# Patient Record
Sex: Female | Born: 1995 | Race: Black or African American | Hispanic: No | Smoking: Former smoker
Health system: Southern US, Community
[De-identification: ages and names within clinical notes are randomized; demographics above are authoritative.]

## PROBLEM LIST (undated history)

## (undated) ENCOUNTER — Inpatient Hospital Stay (HOSPITAL_COMMUNITY): Payer: Self-pay

## (undated) ENCOUNTER — Emergency Department: Admission: EM | Payer: Medicaid Other

## (undated) DIAGNOSIS — R011 Cardiac murmur, unspecified: Secondary | ICD-10-CM

## (undated) DIAGNOSIS — T391X2A Poisoning by 4-Aminophenol derivatives, intentional self-harm, initial encounter: Secondary | ICD-10-CM

## (undated) DIAGNOSIS — F32A Depression, unspecified: Secondary | ICD-10-CM

## (undated) DIAGNOSIS — J40 Bronchitis, not specified as acute or chronic: Secondary | ICD-10-CM

## (undated) DIAGNOSIS — Z789 Other specified health status: Secondary | ICD-10-CM

## (undated) DIAGNOSIS — F329 Major depressive disorder, single episode, unspecified: Secondary | ICD-10-CM

## (undated) HISTORY — PX: NO PAST SURGERIES: SHX2092

---
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
## 2014-01-02 ENCOUNTER — Encounter (HOSPITAL_COMMUNITY): Payer: Self-pay | Admitting: *Deleted

## 2014-01-10 ENCOUNTER — Encounter: Payer: Medicaid Other | Admitting: Obstetrics and Gynecology

## 2014-01-10 ENCOUNTER — Encounter: Payer: Self-pay | Admitting: Obstetrics and Gynecology

## 2014-01-10 ENCOUNTER — Inpatient Hospital Stay (HOSPITAL_COMMUNITY)
Admission: AD | Admit: 2014-01-10 | Discharge: 2014-01-10 | Disposition: A | Discharge status: Home / Self Care | Payer: Medicaid Other | Source: Ambulatory Visit | Attending: Obstetrics & Gynecology | Admitting: Obstetrics & Gynecology

## 2014-01-10 ENCOUNTER — Encounter (HOSPITAL_COMMUNITY): Payer: Self-pay | Admitting: *Deleted

## 2014-01-10 DIAGNOSIS — Z3A29 29 weeks gestation of pregnancy: Secondary | ICD-10-CM | POA: Diagnosis not present

## 2014-01-10 DIAGNOSIS — N3000 Acute cystitis without hematuria: Secondary | ICD-10-CM

## 2014-01-10 HISTORY — DX: Bronchitis, not specified as acute or chronic: J40

## 2014-01-10 HISTORY — DX: Cardiac murmur, unspecified: R01.1

## 2014-01-10 LAB — DIFFERENTIAL
BASOS ABS: 0 10*3/uL (ref 0.0–0.1)
BASOS PCT: 0 % (ref 0–1)
Eosinophils Absolute: 0.2 10*3/uL (ref 0.0–0.7)
Eosinophils Relative: 2 % (ref 0–5)
LYMPHS PCT: 28 % (ref 12–46)
Lymphs Abs: 2.9 10*3/uL (ref 0.7–4.0)
Monocytes Absolute: 0.9 10*3/uL (ref 0.1–1.0)
Monocytes Relative: 9 % (ref 3–12)
NEUTROS ABS: 6.2 10*3/uL (ref 1.7–7.7)
NEUTROS PCT: 61 % (ref 43–77)

## 2014-01-10 LAB — URINALYSIS, ROUTINE W REFLEX MICROSCOPIC
Bilirubin Urine: NEGATIVE
Glucose, UA: NEGATIVE mg/dL
Hgb urine dipstick: NEGATIVE
KETONES UR: NEGATIVE mg/dL
LEUKOCYTES UA: NEGATIVE
Nitrite: POSITIVE — AB
PH: 6.5 (ref 5.0–8.0)
Protein, ur: NEGATIVE mg/dL
Specific Gravity, Urine: >1.03 — ABNORMAL HIGH (ref 1.005–1.030)
Urobilinogen, UA: 4 mg/dL — ABNORMAL HIGH (ref 0.0–1.0)

## 2014-01-10 LAB — HEPATITIS B SURFACE ANTIGEN: Hepatitis B Surface Ag: NEGATIVE

## 2014-01-10 LAB — CBC
HCT: 30.3 % — ABNORMAL LOW (ref 36.0–46.0)
HEMOGLOBIN: 10.2 g/dL — AB (ref 12.0–15.0)
MCH: 30.7 pg (ref 26.0–34.0)
MCHC: 33.7 g/dL (ref 30.0–36.0)
MCV: 91.3 fL (ref 78.0–100.0)
Platelets: 208 10*3/uL (ref 150–400)
RBC: 3.32 MIL/uL — ABNORMAL LOW (ref 3.87–5.11)
RDW: 12.4 % (ref 11.5–15.5)
WBC: 10.2 10*3/uL (ref 4.0–10.5)

## 2014-01-10 LAB — RUBELLA SCREEN: RUBELLA: 1.89 {index} — AB (ref <0.90)

## 2014-01-10 LAB — URINE MICROSCOPIC-ADD ON

## 2014-01-10 LAB — ABO/RH: ABO/RH(D): O POS

## 2014-01-10 LAB — HIV ANTIBODY (ROUTINE TESTING W REFLEX): HIV: NONREACTIVE

## 2014-01-10 LAB — TYPE AND SCREEN
ABO/RH(D): O POS
ANTIBODY SCREEN: NEGATIVE

## 2014-01-10 LAB — FETAL FIBRONECTIN: Fetal Fibronectin: NEGATIVE

## 2014-01-10 LAB — RPR

## 2014-01-10 MED ORDER — LACTATED RINGERS IV BOLUS (SEPSIS)
1000.0000 mL | Freq: Once | INTRAVENOUS | Status: AC
  Administered 2014-01-10: 1000 mL via INTRAVENOUS

## 2014-01-10 MED ORDER — NIFEDIPINE 10 MG PO CAPS
20.0000 mg | ORAL_CAPSULE | Freq: Once | ORAL | Status: AC
Start: 2014-01-10 — End: 2014-01-10
  Administered 2014-01-10: 20 mg via ORAL
  Filled 2014-01-10: qty 2

## 2014-01-10 MED ORDER — NITROFURANTOIN MONOHYD MACRO 100 MG PO CAPS: 100.0000 mg | ORAL_CAPSULE | Freq: Two times a day (BID) | ORAL | Status: DC

## 2014-01-10 NOTE — Discharge Instructions (Signed)
Pregnancy and Urinary Tract Infection °A urinary tract infection (UTI) is a bacterial infection of the urinary tract. Infection of the urinary tract can include the ureters, kidneys (pyelonephritis), bladder (cystitis), and urethra (urethritis). All pregnant women should be screened for bacteria in the urinary tract. Identifying and treating a UTI will decrease the risk of preterm labor and developing more serious infections in both the mother and baby. °CAUSES °Bacteria germs cause almost all UTIs.  °RISK FACTORS °Many factors can increase your chances of getting a UTI during pregnancy. These include: °· Having a short urethra. °· Poor toilet and hygiene habits. °· Sexual intercourse. °· Blockage of urine along the urinary tract. °· Problems with the pelvic muscles or nerves. °· Diabetes. °· Obesity. °· Bladder problems after having several children. °· Previous history of UTI. °SIGNS AND SYMPTOMS  °· Pain, burning, or a stinging feeling when urinating. °· Suddenly feeling the need to urinate right away (urgency). °· Loss of bladder control (urinary incontinence). °· Frequent urination, more than is common with pregnancy. °· Lower abdominal or back discomfort. °· Cloudy urine. °· Blood in the urine (hematuria). °· Fever.  °When the kidneys are infected, the symptoms may be: °· Back pain. °· Flank pain on the right side more so than the left. °· Fever. °· Chills. °· Nausea. °· Vomiting. °DIAGNOSIS  °A urinary tract infection is usually diagnosed through urine tests. Additional tests and procedures are sometimes done. These may include: °· Ultrasound exam of the kidneys, ureters, bladder, and urethra. °· Looking in the bladder with a lighted tube (cystoscopy). °TREATMENT °Typically, UTIs can be treated with antibiotic medicines.  °HOME CARE INSTRUCTIONS  °· Only take over-the-counter or prescription medicines as directed by your health care provider. If you were prescribed antibiotics, take them as directed. Finish  them even if you start to feel better. °· Drink enough fluids to keep your urine clear or pale yellow. °· Do not have sexual intercourse until the infection is gone and your health care provider says it is okay. °· Make sure you are tested for UTIs throughout your pregnancy. These infections often come back.  °Preventing a UTI in the Future °· Practice good toilet habits. Always wipe from front to back. Use the tissue only once. °· Do not hold your urine. Empty your bladder as soon as possible when the urge comes. °· Do not douche or use deodorant sprays. °· Wash with soap and warm water around the genital area and the anus. °· Empty your bladder before and after sexual intercourse. °· Wear underwear with a cotton crotch. °· Avoid caffeine and carbonated drinks. They can irritate the bladder. °· Drink cranberry juice or take cranberry pills. This may decrease the risk of getting a UTI. °· Do not drink alcohol. °· Keep all your appointments and tests as scheduled.  °SEEK MEDICAL CARE IF:  °· Your symptoms get worse. °· You are still having fevers 2 or more days after treatment begins. °· You have a rash. °· You feel that you are having problems with medicines prescribed. °· You have abnormal vaginal discharge. °SEEK IMMEDIATE MEDICAL CARE IF:  °· You have back or flank pain. °· You have chills. °· You have blood in your urine. °· You have nausea and vomiting. °· You have contractions of your uterus. °· You have a gush of fluid from the vagina. °MAKE SURE YOU: °· Understand these instructions.   °· Will watch your condition.   °· Will get help right away if you are not doing   well or get worse.   °Document Released: 06/13/2010 Document Revised: 12/07/2012 Document Reviewed: 09/15/2012 °ExitCare® Patient Information ©2015 [employer]. This information is not intended to replace advice given to you by your health care provider. Make sure you discuss any questions you have with your health care provider. ° °

## 2014-01-10 NOTE — MAU Note (Signed)
Pt arrives via EMS with complaint of lower abd pain x one hour. Has an appointment here on 11/18, but has not had any other care.

## 2014-01-10 NOTE — MAU Provider Note (Signed)
History     CSN: [id number]  Arrival date and time: 01/10/14 0030   None     Chief Complaint  Patient presents with  . Abdominal Pain   HPI  Veronica Brooks is a 18 y.o. G2P1001 at 3373w2d with no prenatal care who presents with sudden onset, lower abdominal pain for 1 hour prior to arrival in MAU. Pain described as cramp like. Patient has not noticed anything that makes the pain better or worse. Has not tried any medications. No dysuria. No vaginal discharge. Endorses some chills, no fevers.   Reports that her pain feels somewhat similar to contractions. She denies LOF, vaginal bleeding. Reports active fetal movement.   OB History    Gravida Para Term Preterm AB TAB SAB Ectopic Multiple Living   2 1 1       1       Past Medical History  Diagnosis Date  . Medical history non-contributory   . Heart murmur   . Bronchitis     Past Surgical History  Procedure Laterality Date  . No past surgeries      History reviewed. No pertinent family history.  History  Substance Use Topics  . Smoking status: Never Smoker   . Smokeless tobacco: Never Used  . Alcohol Use: No    Allergies:  Allergies  Allergen Reactions  . Latex Rash    No prescriptions prior to admission    Review of Systems  All other systems reviewed and are negative.  Physical Exam   Blood pressure 102/49, pulse 88, temperature 99 F (37.2 C), temperature source Oral, resp. rate 18, last menstrual period 06/19/2013, SpO2 100 %.  Physical Exam  Nursing note and vitals reviewed. Constitutional: She is oriented to person, place, and time. She appears well-developed and well-nourished. No distress.  HENT:  Head: Normocephalic and atraumatic.  Eyes: EOM are normal.  Neck: Normal range of motion.  Cardiovascular: Normal rate, regular rhythm and normal heart sounds.   No murmur heard. Respiratory: Effort normal and breath sounds normal. No respiratory distress. She has no wheezes.  GI: Soft. Bowel sounds  are normal. She exhibits no distension. There is no tenderness. There is no rebound and no guarding.  Musculoskeletal: Normal range of motion. She exhibits no edema.  Neurological: She is alert and oriented to person, place, and time. She has normal reflexes.  Skin: Skin is warm and dry.  FHT:  FHR: 140 bpm, variability: moderate,  accelerations:  present,  decelerations:  Absent UC: Irregular, every 1-6 minutes Dilation: 1 Effacement (%): Thick Cervical Position: Posterior Exam by:: Dr. Loreta AveAcosta   Procedures-None  Results for orders placed or performed during the hospital encounter of 01/10/14 (from the past 24 hour(s))  Urinalysis, Routine w reflex microscopic     Status: Abnormal   Collection Time: 01/10/14 12:50 AM  Result Value Ref Range   Color, Urine YELLOW YELLOW   APPearance HAZY (A) CLEAR   Specific Gravity, Urine >1.030 (H) 1.005 - 1.030   pH 6.5 5.0 - 8.0   Glucose, UA NEGATIVE NEGATIVE mg/dL   Hgb urine dipstick NEGATIVE NEGATIVE   Bilirubin Urine NEGATIVE NEGATIVE   Ketones, ur NEGATIVE NEGATIVE mg/dL   Protein, ur NEGATIVE NEGATIVE mg/dL   Urobilinogen, UA 4.0 (H) 0.0 - 1.0 mg/dL   Nitrite POSITIVE (A) NEGATIVE   Leukocytes, UA NEGATIVE NEGATIVE  Urine microscopic-add on     Status: Abnormal   Collection Time: 01/10/14 12:50 AM  Result Value Ref Range   Squamous  Epithelial / LPF FEW (A) RARE   WBC, UA 0-2 <3 WBC/hpf   RBC / HPF 0-2 <3 RBC/hpf   Bacteria, UA MANY (A) RARE  CBC     Status: Abnormal   Collection Time: 01/10/14  1:15 AM  Result Value Ref Range   WBC 10.2 4.0 - 10.5 K/uL   RBC 3.32 (L) 3.87 - 5.11 MIL/uL   Hemoglobin 10.2 (L) 12.0 - 15.0 g/dL   HCT 11.930.3 (L) 14.736.0 - 82.946.0 %   MCV 91.3 78.0 - 100.0 fL   MCH 30.7 26.0 - 34.0 pg   MCHC 33.7 30.0 - 36.0 g/dL   RDW 56.212.4 13.011.5 - 86.515.5 %   Platelets 208 150 - 400 K/uL  Differential     Status: None   Collection Time: 01/10/14  1:15 AM  Result Value Ref Range   Neutrophils Relative % 61 43 - 77 %    Neutro Abs 6.2 1.7 - 7.7 K/uL   Lymphocytes Relative 28 12 - 46 %   Lymphs Abs 2.9 0.7 - 4.0 K/uL   Monocytes Relative 9 3 - 12 %   Monocytes Absolute 0.9 0.1 - 1.0 K/uL   Eosinophils Relative 2 0 - 5 %   Eosinophils Absolute 0.2 0.0 - 0.7 K/uL   Basophils Relative 0 0 - 1 %   Basophils Absolute 0.0 0.0 - 0.1 K/uL  Type and screen     Status: None   Collection Time: 01/10/14  1:15 AM  Result Value Ref Range   ABO/RH(D) O POS    Antibody Screen NEG    Sample Expiration 01/13/2014   ABO/Rh     Status: None   Collection Time: 01/10/14  1:15 AM  Result Value Ref Range   ABO/RH(D) O POS   Fetal fibronectin     Status: None   Collection Time: 01/10/14  1:55 AM  Result Value Ref Range   Fetal Fibronectin NEGATIVE NEGATIVE     Assessment and Plan  A: 6160w2d IUP with preterm contractions, likely secondary to UTI/cystitis as demonstrated by UA results. Contractions ceased with IV LR bolus and procardia. FFN negative. FHT reassuring.   P: Discharge home in stable condition Prescription for macrobid given for UTI Prenatal labs drawn.  Preterm labor precautions reviewed.   Jacquiline Doearker, Caleb 01/10/2014, 4:48 AM    OB fellow attestation:  I have seen and examined this patient; I agree with above documentation in the resident's note.   Veronica Brooks CharsSiler is a 18 y.o. G2P1001 reporting abdominal pain +FM, denies LOF, VB, vaginal discharge.  PE: BP 102/49 mmHg  Pulse 88  Temp(Src) 99 F (37.2 C) (Oral)  Resp 18  SpO2 100%  LMP 06/19/2013 Gen: calm comfortable, NAD Resp: normal effort, no distress Abd: gravid  ROS, labs, PMH reviewed Dilation: 1 Effacement (%): Thick Cervical Position: Posterior Exam by:: Dr. Loreta AveAcosta NST: reactive, contracting regularly q962min  Plan: - preterm labor precautions - continue routine follow up in OB clinic - placed IV, given 1L LR bolus, procardia 20mg  x 1, and contractions resolved.  rx'd macrobid for UTI  Perry MountACOSTA,Andrews Tener ROCIO, MD 7:51 AM

## 2014-01-17 ENCOUNTER — Encounter: Payer: Self-pay | Admitting: General Practice

## 2014-01-17 ENCOUNTER — Encounter: Payer: Self-pay | Admitting: Family

## 2014-01-17 ENCOUNTER — Ambulatory Visit (INDEPENDENT_AMBULATORY_CARE_PROVIDER_SITE_OTHER): Payer: Medicaid Other | Admitting: Family

## 2014-01-17 VITALS — BP 117/53 | HR 92 | Temp 97.4°F | Wt 125.5 lb

## 2014-01-17 DIAGNOSIS — O0930 Supervision of pregnancy with insufficient antenatal care, unspecified trimester: Secondary | ICD-10-CM | POA: Insufficient documentation

## 2014-01-17 DIAGNOSIS — O0933 Supervision of pregnancy with insufficient antenatal care, third trimester: Secondary | ICD-10-CM

## 2014-01-17 DIAGNOSIS — Z23 Encounter for immunization: Secondary | ICD-10-CM

## 2014-01-17 LAB — POCT URINALYSIS DIP (DEVICE)
Bilirubin Urine: NEGATIVE
Glucose, UA: NEGATIVE mg/dL
HGB URINE DIPSTICK: NEGATIVE
Ketones, ur: NEGATIVE mg/dL
Nitrite: POSITIVE — AB
PH: 7 (ref 5.0–8.0)
PROTEIN: NEGATIVE mg/dL
SPECIFIC GRAVITY, URINE: 1.015 (ref 1.005–1.030)
Urobilinogen, UA: 0.2 mg/dL (ref 0.0–1.0)

## 2014-01-17 LAB — GLUCOSE TOLERANCE, 1 HOUR (50G) W/O FASTING: GLUCOSE 1 HOUR GTT: 139 mg/dL (ref 70–140)

## 2014-01-17 LAB — OB RESULTS CONSOLE GC/CHLAMYDIA
Chlamydia: NEGATIVE
GC PROBE AMP, GENITAL: NEGATIVE

## 2014-01-17 MED ORDER — CONCEPT DHA 53.5-38-1 MG PO CAPS: 1.0000 | ORAL_CAPSULE | Freq: Every day | ORAL | Status: DC

## 2014-01-17 MED ORDER — TETANUS-DIPHTH-ACELL PERTUSSIS 5-2.5-18.5 LF-MCG/0.5 IM SUSP
0.5000 mL | Freq: Once | INTRAMUSCULAR | Status: AC
  Administered 2014-01-17: 0.5 mL via INTRAMUSCULAR

## 2014-01-17 NOTE — Progress Notes (Signed)
   Subjective:    Veronica GoreDeja Brooks is a G2P1001 4663w0d being seen today for her first obstetrical visit.  Her obstetrical history is significant for late prenatal care.. Current UTI, has not picked up RX.  Patient does intend to breast feed. Pregnancy history fully reviewed.  Patient reports no complaints.  Filed Vitals:   01/17/14 0910  BP: 117/53  Pulse: 92  Temp: 97.4 F (36.3 C)  Weight: 125 lb 8 oz (56.926 kg)    HISTORY: OB History  Gravida Para Term Preterm AB SAB TAB Ectopic Multiple Living  2 1 1       1     # Outcome Date GA Lbr Len/2nd Weight Sex Delivery Anes PTL Lv  2 Current           1 Term 03/13/12 8672w0d  8 lb (3.629 kg) F Vag-Spont None N Y     Past Medical History  Diagnosis Date  . Medical history non-contributory   . Bronchitis   . Heart murmur     Does not require cardiologist   Past Surgical History  Procedure Laterality Date  . No past surgeries     Family History  Problem Relation Age of Onset  . Diabetes Maternal Grandmother   . Cancer Maternal Grandfather     Exam   BP 117/53 mmHg  Pulse 92  Temp(Src) 97.4 F (36.3 C)  Wt 125 lb 8 oz (56.926 kg)  LMP 07/19/2013 (Exact Date) Uterine Size: size equals dates  Pelvic Exam:    Perineum: No Hemorrhoids, Normal Perineum   Vulva: normal   Vagina:  normal mucosa, normal discharge, no palpable nodules   pH: Not done   Cervix: no cervical motion tenderness and no lesions   Adnexa: normal adnexa and no mass, fullness, tenderness   Bony Pelvis: Adequate  System: Breast:  No nipple retraction or dimpling, No nipple discharge or bleeding, No axillary or supraclavicular adenopathy, Normal to palpation without dominant masses   Skin: normal coloration and turgor, no rashes    Neurologic: negative   Extremities: normal strength, tone, and muscle mass   HEENT neck supple with midline trachea and thyroid without masses   Mouth/Teeth mucous membranes moist, pharynx normal without lesions   Neck supple  and no masses   Cardiovascular: regular rate and rhythm, no murmurs or gallops   Respiratory:  appears well, vitals normal, no respiratory distress, acyanotic, normal RR, neck free of mass or lymphadenopathy, chest clear, no wheezing, crepitations, rhonchi, normal symmetric air entry   Abdomen: soft, non-tender; bowel sounds normal; no masses,  no organomegaly   Urinary: urethral meatus normal      Assessment:    Pregnancy:   18 yo G2P1001 at 2663w0d wks IUP by LMP  Patient Active Problem List   Diagnosis Date Noted  . Late prenatal care 01/17/2014  UTI in Pregnancy      Plan:     Reviewed labs results.   Explained importance of picking up RX for UTI.  Reviewed possible consequences including preterm labor, kidney infection.  Pt states will pick up today.   Third trimester labs today.   Prenatal vitamins. Problem list reviewed and updated. Genetic Screening:  Too Late  Ultrasound discussed; fetal survey: ordered.  Follow up in 2 weeks.   Marlis EdelsonKARIM, Tekla Malachowski N 01/17/2014

## 2014-01-17 NOTE — Progress Notes (Signed)
Initial prenatal visit 28 wk labs with TDap Flu vaccine Some labs done in MAU on 01/10/14

## 2014-01-18 LAB — GC/CHLAMYDIA PROBE AMP
CT PROBE, AMP APTIMA: NEGATIVE
GC Probe RNA: NEGATIVE

## 2014-01-19 ENCOUNTER — Ambulatory Visit (HOSPITAL_COMMUNITY)
Admission: RE | Admit: 2014-01-19 | Discharge: 2014-01-19 | Disposition: A | Discharge status: Home / Self Care | Payer: Medicaid Other | Source: Ambulatory Visit | Attending: Family | Admitting: Family

## 2014-01-19 ENCOUNTER — Encounter: Payer: Self-pay | Admitting: Family

## 2014-01-19 DIAGNOSIS — Z3A31 31 weeks gestation of pregnancy: Secondary | ICD-10-CM | POA: Insufficient documentation

## 2014-01-19 DIAGNOSIS — O0933 Supervision of pregnancy with insufficient antenatal care, third trimester: Secondary | ICD-10-CM | POA: Insufficient documentation

## 2014-01-19 DIAGNOSIS — IMO0001 Reserved for inherently not codable concepts without codable children: Secondary | ICD-10-CM | POA: Insufficient documentation

## 2014-01-19 DIAGNOSIS — Z36 Encounter for antenatal screening of mother: Secondary | ICD-10-CM | POA: Insufficient documentation

## 2014-01-20 LAB — CULTURE, OB URINE: Colony Count: >=100000

## 2014-01-22 ENCOUNTER — Telehealth: Payer: Self-pay | Admitting: *Deleted

## 2014-01-22 NOTE — Telephone Encounter (Signed)
-----   Message from Marlis EdelsonWalidah N Karim, [place] sent at 01/19/2014 10:48 PM EST ----- Regarding: Need 3 hr Please notify patient to come in for 3 hr GTT.

## 2014-01-22 NOTE — Telephone Encounter (Signed)
Called patient and left message for her to return our call.

## 2014-01-23 NOTE — Telephone Encounter (Signed)
Called patient and left message to please call us back.

## 2014-01-24 ENCOUNTER — Encounter: Payer: Self-pay | Admitting: *Deleted

## 2014-01-24 NOTE — Telephone Encounter (Signed)
Attempted to contact patient with both numbers listed, unable to leave a message.  Will send a certified letter.  Letter sent.

## 2014-01-31 ENCOUNTER — Encounter: Payer: Self-pay | Admitting: Obstetrics and Gynecology

## 2014-01-31 ENCOUNTER — Encounter: Payer: Medicaid Other | Admitting: Obstetrics and Gynecology

## 2014-02-13 ENCOUNTER — Telehealth: Payer: Self-pay | Admitting: *Deleted

## 2014-02-13 NOTE — Telephone Encounter (Signed)
Pt left message stating that she wants to know her due date from the [place] in [place].  I called pt and informed her that the due date is 03/23/14.  Because this is one month different from her due date by LMP, it is considered to be the most accurate.  Her due date will not be changed.  Pt voiced understanding.

## 2014-02-14 ENCOUNTER — Encounter: Payer: Self-pay | Admitting: Obstetrics and Gynecology

## 2014-02-14 ENCOUNTER — Ambulatory Visit (INDEPENDENT_AMBULATORY_CARE_PROVIDER_SITE_OTHER): Payer: Medicaid Other | Admitting: Obstetrics and Gynecology

## 2014-02-14 VITALS — BP 117/63 | HR 95 | Temp 98.4°F | Wt 128.6 lb

## 2014-02-14 DIAGNOSIS — R7302 Impaired glucose tolerance (oral): Secondary | ICD-10-CM

## 2014-02-14 DIAGNOSIS — R7309 Other abnormal glucose: Secondary | ICD-10-CM

## 2014-02-14 DIAGNOSIS — O0933 Supervision of pregnancy with insufficient antenatal care, third trimester: Secondary | ICD-10-CM

## 2014-02-14 DIAGNOSIS — O09893 Supervision of other high risk pregnancies, third trimester: Secondary | ICD-10-CM

## 2014-02-14 LAB — POCT URINALYSIS DIP (DEVICE)
Bilirubin Urine: NEGATIVE
Glucose, UA: NEGATIVE mg/dL
Hgb urine dipstick: NEGATIVE
Ketones, ur: NEGATIVE mg/dL
NITRITE: POSITIVE — AB
PH: 7 (ref 5.0–8.0)
PROTEIN: 30 mg/dL — AB
Specific Gravity, Urine: 1.02 (ref 1.005–1.030)

## 2014-02-14 NOTE — Patient Instructions (Signed)
Third Trimester of Pregnancy The third trimester is from week 29 through week 42, months 7 through 9. The third trimester is a time when the fetus is growing rapidly. At the end of the ninth month, the fetus is about 20 inches in length and weighs 6-10 pounds.  BODY CHANGES Your body goes through many changes during pregnancy. The changes vary from woman to woman.   Your weight will continue to increase. You can expect to gain 25-35 pounds (11-16 kg) by the end of the pregnancy.  You may begin to get stretch marks on your hips, abdomen, and breasts.  You may urinate more often because the fetus is moving lower into your pelvis and pressing on your bladder.  You may develop or continue to have heartburn as a result of your pregnancy.  You may develop constipation because certain hormones are causing the muscles that push waste through your intestines to slow down.  You may develop hemorrhoids or swollen, bulging veins (varicose veins).  You may have pelvic pain because of the weight gain and pregnancy hormones relaxing your joints between the bones in your pelvis. Backaches may result from overexertion of the muscles supporting your posture.  You may have changes in your hair. These can include thickening of your hair, rapid growth, and changes in texture. Some women also have hair loss during or after pregnancy, or hair that feels dry or thin. Your hair will most likely return to normal after your baby is born.  Your breasts will continue to grow and be tender. A yellow discharge may leak from your breasts called colostrum.  Your belly button may stick out.  You may feel short of breath because of your expanding uterus.  You may notice the fetus "dropping," or moving lower in your abdomen.  You may have a bloody mucus discharge. This usually occurs a few days to a week before labor begins.  Your cervix becomes thin and soft (effaced) near your due date. WHAT TO EXPECT AT YOUR PRENATAL  EXAMS  You will have prenatal exams every 2 weeks until week 36. Then, you will have weekly prenatal exams. During a routine prenatal visit:  You will be weighed to make sure you and the fetus are growing normally.  Your blood pressure is taken.  Your abdomen will be measured to track your baby's growth.  The fetal heartbeat will be listened to.  Any test results from the previous visit will be discussed.  You may have a cervical check near your due date to see if you have effaced. At around 36 weeks, your caregiver will check your cervix. At the same time, your caregiver will also perform a test on the secretions of the vaginal tissue. This test is to determine if a type of bacteria, Group B streptococcus, is present. Your caregiver will explain this further. Your caregiver may ask you:  What your birth plan is.  How you are feeling.  If you are feeling the baby move.  If you have had any abnormal symptoms, such as leaking fluid, bleeding, severe headaches, or abdominal cramping.  If you have any questions. Other tests or screenings that may be performed during your third trimester include:  Blood tests that check for low iron levels (anemia).  Fetal testing to check the health, activity level, and growth of the fetus. Testing is done if you have certain medical conditions or if there are problems during the pregnancy. FALSE LABOR You may feel small, irregular contractions that   eventually go away. These are called Braxton Hicks contractions, or false labor. Contractions may last for hours, days, or even weeks before true labor sets in. If contractions come at regular intervals, intensify, or become painful, it is best to be seen by your caregiver.  SIGNS OF LABOR   Menstrual-like cramps.  Contractions that are 5 minutes apart or less.  Contractions that start on the top of the uterus and spread down to the lower abdomen and back.  A sense of increased pelvic pressure or back  pain.  A watery or bloody mucus discharge that comes from the vagina. If you have any of these signs before the 37th week of pregnancy, call your caregiver right away. You need to go to the hospital to get checked immediately. HOME CARE INSTRUCTIONS   Avoid all smoking, herbs, alcohol, and unprescribed drugs. These chemicals affect the formation and growth of the baby.  Follow your caregiver's instructions regarding medicine use. There are medicines that are either safe or unsafe to take during pregnancy.  Exercise only as directed by your caregiver. Experiencing uterine cramps is a good sign to stop exercising.  Continue to eat regular, healthy meals.  Wear a good support bra for breast tenderness.  Do not use hot tubs, steam rooms, or saunas.  Wear your seat belt at all times when driving.  Avoid raw meat, uncooked cheese, cat litter boxes, and soil used by cats. These carry germs that can cause birth defects in the baby.  Take your prenatal vitamins.  Try taking a stool softener (if your caregiver approves) if you develop constipation. Eat more high-fiber foods, such as fresh vegetables or fruit and whole grains. Drink plenty of fluids to keep your urine clear or pale yellow.  Take warm sitz baths to soothe any pain or discomfort caused by hemorrhoids. Use hemorrhoid cream if your caregiver approves.  If you develop varicose veins, wear support hose. Elevate your feet for 15 minutes, 3-4 times a day. Limit salt in your diet.  Avoid heavy lifting, wear low heal shoes, and practice good posture.  Rest a lot with your legs elevated if you have leg cramps or low back pain.  Visit your dentist if you have not gone during your pregnancy. Use a soft toothbrush to brush your teeth and be gentle when you floss.  A sexual relationship may be continued unless your caregiver directs you otherwise.  Do not travel far distances unless it is absolutely necessary and only with the approval  of your caregiver.  Take prenatal classes to understand, practice, and ask questions about the labor and delivery.  Make a trial run to the hospital.  Pack your hospital bag.  Prepare the baby's nursery.  Continue to go to all your prenatal visits as directed by your caregiver. SEEK MEDICAL CARE IF:  You are unsure if you are in labor or if your water has broken.  You have dizziness.  You have mild pelvic cramps, pelvic pressure, or nagging pain in your abdominal area.  You have persistent nausea, vomiting, or diarrhea.  You have a bad smelling vaginal discharge.  You have pain with urination. SEEK IMMEDIATE MEDICAL CARE IF:   You have a fever.  You are leaking fluid from your vagina.  You have spotting or bleeding from your vagina.  You have severe abdominal cramping or pain.  You have rapid weight loss or gain.  You have shortness of breath with chest pain.  You notice sudden or extreme swelling   of your face, hands, ankles, feet, or legs.  You have not felt your baby move in over an hour.  You have severe headaches that do not go away with medicine.  You have vision changes. Document Released: 02/10/2001 Document Revised: 02/21/2013 Document Reviewed: 04/19/2012 [employer] Patient Information 2015 [employer]. This information is not intended to replace advice given to you by your health care provider. Make sure you discuss any questions you have with your health care provider.  

## 2014-02-14 NOTE — Progress Notes (Signed)
Reviewed US at 31 wks BEGA.  GCT 139> schedule 3hr OGTT.  UDS not found> sent.  E.Coli ASB was never treated. States she will get Rx filled asap.  Plans Mirena

## 2014-02-14 NOTE — Addendum Note (Signed)
Addended by: Candelaria StagersHAIZLIP, Araminta Zorn E on: 02/14/2014 10:35 AM   Modules accepted: Orders

## 2014-02-15 LAB — PRESCRIPTION MONITORING PROFILE (19 PANEL)
Amphetamine/Meth: NEGATIVE ng/mL
BARBITURATE SCREEN, URINE: NEGATIVE ng/mL
Benzodiazepine Screen, Urine: NEGATIVE ng/mL
Buprenorphine, Urine: NEGATIVE ng/mL
CANNABINOID SCRN UR: NEGATIVE ng/mL
CARISOPRODOL, URINE: NEGATIVE ng/mL
Cocaine Metabolites: NEGATIVE ng/mL
Creatinine, Urine: 208.93 mg/dL (ref >20.0)
Fentanyl, Ur: NEGATIVE ng/mL
MDMA URINE: NEGATIVE ng/mL
METHADONE SCREEN, URINE: NEGATIVE ng/mL
Meperidine, Ur: NEGATIVE ng/mL
Methaqualone: NEGATIVE ng/mL
Nitrites, Initial: NEGATIVE ug/mL
OPIATE SCREEN, URINE: NEGATIVE ng/mL
OXYCODONE SCRN UR: NEGATIVE ng/mL
PHENCYCLIDINE, UR: NEGATIVE ng/mL
Propoxyphene: NEGATIVE ng/mL
TRAMADOL UR: NEGATIVE ng/mL
Tapentadol, urine: NEGATIVE ng/mL
Zolpidem, Urine: NEGATIVE ng/mL
pH, Initial: 7.6 pH (ref 4.5–8.9)

## 2014-02-15 LAB — ALCOHOL METABOLITE (ETG), URINE: Ethyl Glucuronide (EtG): NEGATIVE ng/mL

## 2014-02-16 ENCOUNTER — Other Ambulatory Visit: Payer: Medicaid Other

## 2014-02-28 ENCOUNTER — Encounter: Payer: Self-pay | Admitting: Obstetrics and Gynecology

## 2014-02-28 ENCOUNTER — Encounter: Payer: Medicaid Other | Admitting: Obstetrics and Gynecology

## 2014-03-02 NOTE — L&D Delivery Note (Signed)
Delivery Note At 10:59 PM a viable female was delivered via Vaginal, Spontaneous Delivery (Presentation: Left Occiput Anterior).  APGAR: 9, 9; weight pending .   Placenta status: Intact, Spontaneous.  Cord: 3 vessels with the following complications: marginal insertion.    Anesthesia: None  Episiotomy: None Lacerations: Labial Suture Repair: none Est. Blood Loss (mL): 150  Mom to postpartum.  Baby to Couplet care / Skin to Skin   Delivery supervised by Philipp DeputyKim Shaw, CNM  Veronica Brooks,Veronica Brooks Wynne 03/23/2014, 11:18 PM

## 2014-03-04 ENCOUNTER — Inpatient Hospital Stay (HOSPITAL_COMMUNITY)
Admission: AD | Admit: 2014-03-04 | Discharge: 2014-03-04 | Disposition: A | Discharge status: Home / Self Care | Payer: Medicaid Other | Source: Ambulatory Visit | Attending: Obstetrics & Gynecology | Admitting: Obstetrics & Gynecology

## 2014-03-04 ENCOUNTER — Encounter (HOSPITAL_COMMUNITY): Payer: Self-pay | Admitting: *Deleted

## 2014-03-04 DIAGNOSIS — Z3A37 37 weeks gestation of pregnancy: Secondary | ICD-10-CM | POA: Diagnosis not present

## 2014-03-04 DIAGNOSIS — O471 False labor at or after 37 completed weeks of gestation: Secondary | ICD-10-CM | POA: Insufficient documentation

## 2014-03-04 DIAGNOSIS — O0933 Supervision of pregnancy with insufficient antenatal care, third trimester: Secondary | ICD-10-CM

## 2014-03-04 NOTE — MAU Note (Signed)
Pt started contracting last night and she says they have not stopped but have gotten progressively worse.  Denies vag bleeding or leaking.  Reports good fetal movement.

## 2014-03-04 NOTE — Discharge Instructions (Signed)
Braxton Hicks Contractions °Contractions of the uterus can occur throughout pregnancy. Contractions are not always a sign that you are in labor.  °WHAT ARE BRAXTON HICKS CONTRACTIONS?  °Contractions that occur before labor are called Braxton Hicks contractions, or false labor. Toward the end of pregnancy (32-34 weeks), these contractions can develop more often and may become more forceful. This is not true labor because these contractions do not result in opening (dilatation) and thinning of the cervix. They are sometimes difficult to tell apart from true labor because these contractions can be forceful and people have different pain tolerances. You should not feel embarrassed if you go to the hospital with false labor. Sometimes, the only way to tell if you are in true labor is for your health care provider to look for changes in the cervix. °If there are no prenatal problems or other health problems associated with the pregnancy, it is completely safe to be sent home with false labor and await the onset of true labor. °HOW CAN YOU TELL THE DIFFERENCE BETWEEN TRUE AND FALSE LABOR? °False Labor °· The contractions of false labor are usually shorter and not as hard as those of true labor.   °· The contractions are usually irregular.   °· The contractions are often felt in the front of the lower abdomen and in the groin.   °· The contractions may go away when you walk around or change positions while lying down.   °· The contractions get weaker and are shorter lasting as time goes on.   °· The contractions do not usually become progressively stronger, regular, and closer together as with true labor.   °True Labor °· Contractions in true labor last 30-70 seconds, become very regular, usually become more intense, and increase in frequency.   °· The contractions do not go away with walking.   °· The discomfort is usually felt in the top of the uterus and spreads to the lower abdomen and low back.   °· True labor can be  determined by your health care provider with an exam. This will show that the cervix is dilating and getting thinner.   °WHAT TO REMEMBER °· Keep up with your usual exercises and follow other instructions given by your health care provider.   °· Take medicines as directed by your health care provider.   °· Keep your regular prenatal appointments.   °· Eat and drink lightly if you think you are going into labor.   °· If Braxton Hicks contractions are making you uncomfortable:   °¨ Change your position from lying down or resting to walking, or from walking to resting.   °¨ Sit and rest in a tub of warm water.   °¨ Drink 2-3 glasses of water. Dehydration may cause these contractions.   °¨ Do slow and deep breathing several times an hour.   °WHEN SHOULD I SEEK IMMEDIATE MEDICAL CARE? °Seek immediate medical care if: °· Your contractions become stronger, more regular, and closer together.   °· You have fluid leaking or gushing from your vagina.   °· You have a fever.   °· You pass blood-tinged mucus.   °· You have vaginal bleeding.   °· You have continuous abdominal pain.   °· You have low back pain that you never had before.   °· You feel your baby's head pushing down and causing pelvic pressure.   °· Your baby is not moving as much as it used to.   °Document Released: 02/16/2005 Document Revised: 02/21/2013 Document Reviewed: 11/28/2012 °[employer]® Patient Information ©2015 [employer]. This information is not intended to replace advice given to you by your health care   provider. Make sure you discuss any questions you have with your health care provider. ° °

## 2014-03-04 NOTE — MAU Note (Signed)
Pt reports that she had one  pepsi and ice all day.

## 2014-03-07 ENCOUNTER — Ambulatory Visit (INDEPENDENT_AMBULATORY_CARE_PROVIDER_SITE_OTHER): Payer: Medicaid Other | Admitting: Advanced Practice Midwife

## 2014-03-07 VITALS — BP 94/61 | HR 97 | Wt 131.1 lb

## 2014-03-07 DIAGNOSIS — O0933 Supervision of pregnancy with insufficient antenatal care, third trimester: Secondary | ICD-10-CM

## 2014-03-07 DIAGNOSIS — O358XX1 Maternal care for other (suspected) fetal abnormality and damage, fetus 1: Secondary | ICD-10-CM

## 2014-03-07 DIAGNOSIS — IMO0001 Reserved for inherently not codable concepts without codable children: Secondary | ICD-10-CM

## 2014-03-07 DIAGNOSIS — O2343 Unspecified infection of urinary tract in pregnancy, third trimester: Secondary | ICD-10-CM

## 2014-03-07 LAB — OB RESULTS CONSOLE GBS: GBS: NEGATIVE

## 2014-03-07 NOTE — Addendum Note (Signed)
Addended by: Kathee DeltonHILLMAN, Jabari Swoveland L on: 03/07/2014 11:25 AM   Modules accepted: Orders

## 2014-03-07 NOTE — Addendum Note (Signed)
Addended by: Raynald BlendZEYFANG, LINDA L on: 03/07/2014 10:30 AM   Modules accepted: Orders

## 2014-03-07 NOTE — Progress Notes (Signed)
Increased BH UC's. Needs F/U US w/ MFM for bilat renal enlargement-ordered.

## 2014-03-07 NOTE — Progress Notes (Signed)
Urine culture was requested on patient, but patient did not leave urine

## 2014-03-07 NOTE — Patient Instructions (Addendum)
Braxton Hicks Contractions Contractions of the uterus can occur throughout pregnancy. Contractions are not always a sign that you are in labor.  WHAT ARE BRAXTON HICKS CONTRACTIONS?  Contractions that occur before labor are called Braxton Hicks contractions, or false labor. Toward the end of pregnancy (32-34 weeks), these contractions can develop more often and may become more forceful. This is not true labor because these contractions do not result in opening (dilatation) and thinning of the cervix. They are sometimes difficult to tell apart from true labor because these contractions can be forceful and people have different pain tolerances. You should not feel embarrassed if you go to the hospital with false labor. Sometimes, the only way to tell if you are in true labor is for your health care provider to look for changes in the cervix. If there are no prenatal problems or other health problems associated with the pregnancy, it is completely safe to be sent home with false labor and await the onset of true labor. HOW CAN YOU TELL THE DIFFERENCE BETWEEN TRUE AND FALSE LABOR? False Labor  The contractions of false labor are usually shorter and not as hard as those of true labor.   The contractions are usually irregular.   The contractions are often felt in the front of the lower abdomen and in the groin.   The contractions may go away when you walk around or change positions while lying down.   The contractions get weaker and are shorter lasting as time goes on.   The contractions do not usually become progressively stronger, regular, and closer together as with true labor.  True Labor 1. Contractions in true labor last 30-70 seconds, become very regular, usually become more intense, and increase in frequency.  2. The contractions do not go away with walking.  3. The discomfort is usually felt in the top of the uterus and spreads to the lower abdomen and low back.  4. True labor can  be determined by your health care provider with an exam. This will show that the cervix is dilating and getting thinner.  WHAT TO REMEMBER  Keep up with your usual exercises and follow other instructions given by your health care provider.   Take medicines as directed by your health care provider.   Keep your regular prenatal appointments.   Eat and drink lightly if you think you are going into labor.   If Braxton Hicks contractions are making you uncomfortable:   Change your position from lying down or resting to walking, or from walking to resting.   Sit and rest in a tub of warm water.   Drink 2-3 glasses of water. Dehydration may cause these contractions.   Do slow and deep breathing several times an hour.  WHEN SHOULD I SEEK IMMEDIATE MEDICAL CARE? Seek immediate medical care if:  Your contractions become stronger, more regular, and closer together.   You have fluid leaking or gushing from your vagina.   You have a fever.   You pass blood-tinged mucus.   You have vaginal bleeding.   You have continuous abdominal pain.   You have low back pain that you never had before.   You feel your baby's head pushing down and causing pelvic pressure.   Your baby is not moving as much as it used to.  Document Released: 02/16/2005 Document Revised: 02/21/2013 Document Reviewed: 11/28/2012 [facility] Patient Information 2015 [place], [place]. This information is not intended to replace advice given to you by your health care  provider. Make sure you discuss any questions you have with your health care provider.  Fetal Movement Counts Patient Name: __________________________________________________ Patient Due Date: ____________________ Performing a fetal movement count is highly recommended in high-risk pregnancies, but it is good for every pregnant woman to do. Your health care provider may ask you to start counting fetal movements at 28 weeks of the pregnancy. Fetal  movements often increase:  After eating a full meal.  After physical activity.  After eating or drinking something sweet or cold.  At rest. Pay attention to when you feel the baby is most active. This will help you notice a pattern of your baby's sleep and wake cycles and what factors contribute to an increase in fetal movement. It is important to perform a fetal movement count at the same time each day when your baby is normally most active.  HOW TO COUNT FETAL MOVEMENTS 5. Find a quiet and comfortable area to sit or lie down on your left side. Lying on your left side provides the best blood and oxygen circulation to your baby. 6. Write down the day and time on a sheet of paper or in a journal. 7. Start counting kicks, flutters, swishes, rolls, or jabs in a 2-hour period. You should feel at least 10 movements within 2 hours. 8. If you do not feel 10 movements in 2 hours, wait 2-3 hours and count again. Look for a change in the pattern or not enough counts in 2 hours. SEEK MEDICAL CARE IF:  You feel less than 10 counts in 2 hours, tried twice.  There is no movement in over an hour.  The pattern is changing or taking longer each day to reach 10 counts in 2 hours.  You feel the baby is not moving as he or she usually does. Date: ____________ Movements: ____________ Start time: ____________ Doreatha MartinFinish time: ____________  Date: ____________ Movements: ____________ Start time: ____________ Doreatha MartinFinish time: ____________ Date: ____________ Movements: ____________ Start time: ____________ Doreatha MartinFinish time: ____________ Date: ____________ Movements: ____________ Start time: ____________ Doreatha MartinFinish time: ____________ Date: ____________ Movements: ____________ Start time: ____________ Doreatha MartinFinish time: ____________ Date: ____________ Movements: ____________ Start time: ____________ Doreatha MartinFinish time: ____________ Date: ____________ Movements: ____________ Start time: ____________ Doreatha MartinFinish time: ____________ Date: ____________  Movements: ____________ Start time: ____________ Doreatha MartinFinish time: ____________  Date: ____________ Movements: ____________ Start time: ____________ Doreatha MartinFinish time: ____________ Date: ____________ Movements: ____________ Start time: ____________ Doreatha MartinFinish time: ____________ Date: ____________ Movements: ____________ Start time: ____________ Doreatha MartinFinish time: ____________ Date: ____________ Movements: ____________ Start time: ____________ Doreatha MartinFinish time: ____________ Date: ____________ Movements: ____________ Start time: ____________ Doreatha MartinFinish time: ____________ Date: ____________ Movements: ____________ Start time: ____________ Doreatha MartinFinish time: ____________ Date: ____________ Movements: ____________ Start time: ____________ Doreatha MartinFinish time: ____________  Date: ____________ Movements: ____________ Start time: ____________ Doreatha MartinFinish time: ____________ Date: ____________ Movements: ____________ Start time: ____________ Doreatha MartinFinish time: ____________ Date: ____________ Movements: ____________ Start time: ____________ Doreatha MartinFinish time: ____________ Date: ____________ Movements: ____________ Start time: ____________ Doreatha MartinFinish time: ____________ Date: ____________ Movements: ____________ Start time: ____________ Doreatha MartinFinish time: ____________ Date: ____________ Movements: ____________ Start time: ____________ Doreatha MartinFinish time: ____________ Date: ____________ Movements: ____________ Start time: ____________ Doreatha MartinFinish time: ____________  Date: ____________ Movements: ____________ Start time: ____________ Doreatha MartinFinish time: ____________ Date: ____________ Movements: ____________ Start time: ____________ Doreatha MartinFinish time: ____________ Date: ____________ Movements: ____________ Start time: ____________ Doreatha MartinFinish time: ____________ Date: ____________ Movements: ____________ Start time: ____________ Doreatha MartinFinish time: ____________ Date: ____________ Movements: ____________ Start time: ____________ Doreatha MartinFinish time: ____________ Date: ____________ Movements: ____________ Start time:  ____________ Doreatha MartinFinish time: ____________ Date: ____________ Movements:  ____________ Start time: ____________ Doreatha Martin time: ____________  Date: ____________ Movements: ____________ Start time: ____________ Doreatha Martin time: ____________ Date: ____________ Movements: ____________ Start time: ____________ Doreatha Martin time: ____________ Date: ____________ Movements: ____________ Start time: ____________ Doreatha Martin time: ____________ Date: ____________ Movements: ____________ Start time: ____________ Doreatha Martin time: ____________ Date: ____________ Movements: ____________ Start time: ____________ Doreatha Martin time: ____________ Date: ____________ Movements: ____________ Start time: ____________ Doreatha Martin time: ____________ Date: ____________ Movements: ____________ Start time: ____________ Doreatha Martin time: ____________  Date: ____________ Movements: ____________ Start time: ____________ Doreatha Martin time: ____________ Date: ____________ Movements: ____________ Start time: ____________ Doreatha Martin time: ____________ Date: ____________ Movements: ____________ Start time: ____________ Doreatha Martin time: ____________ Date: ____________ Movements: ____________ Start time: ____________ Doreatha Martin time: ____________ Date: ____________ Movements: ____________ Start time: ____________ Doreatha Martin time: ____________ Date: ____________ Movements: ____________ Start time: ____________ Doreatha Martin time: ____________ Date: ____________ Movements: ____________ Start time: ____________ Doreatha Martin time: ____________  Date: ____________ Movements: ____________ Start time: ____________ Doreatha Martin time: ____________ Date: ____________ Movements: ____________ Start time: ____________ Doreatha Martin time: ____________ Date: ____________ Movements: ____________ Start time: ____________ Doreatha Martin time: ____________ Date: ____________ Movements: ____________ Start time: ____________ Doreatha Martin time: ____________ Date: ____________ Movements: ____________ Start time: ____________ Doreatha Martin time: ____________ Date:  ____________ Movements: ____________ Start time: ____________ Doreatha Martin time: ____________ Date: ____________ Movements: ____________ Start time: ____________ Doreatha Martin time: ____________  Date: ____________ Movements: ____________ Start time: ____________ Doreatha Martin time: ____________ Date: ____________ Movements: ____________ Start time: ____________ Doreatha Martin time: ____________ Date: ____________ Movements: ____________ Start time: ____________ Doreatha Martin time: ____________ Date: ____________ Movements: ____________ Start time: ____________ Doreatha Martin time: ____________ Date: ____________ Movements: ____________ Start time: ____________ Doreatha Martin time: ____________ Date: ____________ Movements: ____________ Start time: ____________ Doreatha Martin time: ____________ Document Released: 03/18/2006 Document Revised: 07/03/2013 Document Reviewed: 12/14/2011 [employer] Patient Information 2015 [employer]. This information is not intended to replace advice given to you by your health care provider. Make sure you discuss any questions you have with your health care provider.  Etonogestrel implant What is this medicine? ETONOGESTREL (et oh noe JES trel) is a contraceptive (birth control) device. It is used to prevent pregnancy. It can be used for up to 3 years. This medicine may be used for other purposes; ask your health care provider or pharmacist if you have questions. COMMON BRAND NAME(S): Implanon, Nexplanon What should I tell my health care provider before I take this medicine? They need to know if you have any of these conditions: -abnormal vaginal bleeding -blood vessel disease or blood clots -cancer of the breast, cervix, or liver -depression -diabetes -gallbladder disease -headaches -heart disease or recent heart attack -high blood pressure -high cholesterol -kidney disease -liver disease -renal disease -seizures -tobacco smoker -an unusual or allergic reaction to etonogestrel, other hormones, anesthetics or  antiseptics, medicines, foods, dyes, or preservatives -pregnant or trying to get pregnant -breast-feeding How should I use this medicine? This device is inserted just under the skin on the inner side of your upper arm by a health care professional. Talk to your pediatrician regarding the use of this medicine in children. Special care may be needed. Overdosage: If you think you've taken too much of this medicine contact a poison control center or emergency room at once. Overdosage: If you think you have taken too much of this medicine contact a poison control center or emergency room at once. NOTE: This medicine is only for you. Do not share this medicine with others. What if I miss a dose? This does not apply. What may interact with this medicine? Do not take this medicine with any of the  following medications: -amprenavir -bosentan -fosamprenavir This medicine may also interact with the following medications: -barbiturate medicines for inducing sleep or treating seizures -certain medicines for fungal infections like ketoconazole and itraconazole -griseofulvin -medicines to treat seizures like carbamazepine, felbamate, oxcarbazepine, phenytoin, topiramate -modafinil -phenylbutazone -rifampin -some medicines to treat HIV infection like atazanavir, indinavir, lopinavir, nelfinavir, tipranavir, ritonavir -St. John's wort This list may not describe all possible interactions. Give your health care provider a list of all the medicines, herbs, non-prescription drugs, or dietary supplements you use. Also tell them if you smoke, drink alcohol, or use illegal drugs. Some items may interact with your medicine. What should I watch for while using this medicine? This product does not protect you against HIV infection (AIDS) or other sexually transmitted diseases. You should be able to feel the implant by pressing your fingertips over the skin where it was inserted. Tell your doctor if you cannot feel  the implant. What side effects may I notice from receiving this medicine? Side effects that you should report to your doctor or health care professional as soon as possible: -allergic reactions like skin rash, itching or hives, swelling of the face, lips, or tongue -breast lumps -changes in vision -confusion, trouble speaking or understanding -dark urine -depressed mood -general ill feeling or flu-like symptoms -light-colored stools -loss of appetite, nausea -right upper belly pain -severe headaches -severe pain, swelling, or tenderness in the abdomen -shortness of breath, chest pain, swelling in a leg -signs of pregnancy -sudden numbness or weakness of the face, arm or leg -trouble walking, dizziness, loss of balance or coordination -unusual vaginal bleeding, discharge -unusually weak or tired -yellowing of the eyes or skin Side effects that usually do not require medical attention (Report these to your doctor or health care professional if they continue or are bothersome.): -acne -breast pain -changes in weight -cough -fever or chills -headache -irregular menstrual bleeding -itching, burning, and vaginal discharge -pain or difficulty passing urine -sore throat This list may not describe all possible side effects. Call your doctor for medical advice about side effects. You may report side effects to FDA at 1-800-FDA-1088. Where should I keep my medicine? This drug is given in a hospital or clinic and will not be stored at home. NOTE: This sheet is a summary. It may not cover all possible information. If you have questions about this medicine, talk to your doctor, pharmacist, or health care provider.  2015, [employer]. (2011-08-24 15:37:45)

## 2014-03-07 NOTE — Progress Notes (Signed)
Thinks she lost her mucus plug yesterday.

## 2014-03-08 ENCOUNTER — Encounter (HOSPITAL_COMMUNITY): Payer: Self-pay | Admitting: *Deleted

## 2014-03-08 ENCOUNTER — Inpatient Hospital Stay (HOSPITAL_COMMUNITY)
Admission: AD | Admit: 2014-03-08 | Discharge: 2014-03-08 | Disposition: A | Discharge status: Home / Self Care | Payer: Medicaid Other | Source: Ambulatory Visit | Attending: Obstetrics and Gynecology | Admitting: Obstetrics and Gynecology

## 2014-03-08 DIAGNOSIS — O471 False labor at or after 37 completed weeks of gestation: Secondary | ICD-10-CM | POA: Diagnosis not present

## 2014-03-08 DIAGNOSIS — Z3A37 37 weeks gestation of pregnancy: Secondary | ICD-10-CM | POA: Insufficient documentation

## 2014-03-08 DIAGNOSIS — O0933 Supervision of pregnancy with insufficient antenatal care, third trimester: Secondary | ICD-10-CM

## 2014-03-08 LAB — POCT FERN TEST: POCT Fern Test: NEGATIVE

## 2014-03-08 NOTE — MAU Note (Signed)
Pt reports increased pelvic pressure and cts q3-4 min. Pt denies vaginal bleeding or SROM and good fetal movement reported.

## 2014-03-08 NOTE — MAU Provider Note (Signed)
Ms. Veronica Brooks is a 19 y.o. G2P1001 at [id number]  who presents to MAU today complaining contractions and LOF. The patient states a small amount of clear white discharge. She denies vaginal bleeding. She states frequent contractions, but unsure of frequency. She denies complications with this pregnancy.   BP 113/62 mmHg  Pulse 94  Temp(Src) 98.4 F (36.9 C) (Oral)  Resp 18  LMP 07/19/2013 (Exact Date) GENERAL: Well-developed, well-nourished female in no acute distress.  HEENT: Normocephalic, atraumatic.  LUNGS: Normal effort HEART: Regular rate  ABDOMEN: Soft, nontender, nondistended.  PELVIC: Normal external female genitalia. Vagina is pink and rugated.  Normal discharge.  Negative pooling. Gravid uterus.   EXTREMITIES: No cyanosis, clubbing, or edema Dilation: 2 Effacement (%): 50 Cervical Position: Posterior Station: -2 Presentation: Vertex Exam by:: K.Wilson,RN  Fern - negative  Fetal Monitoring:  Baseline: 125 bpm, moderate variability, + accelerations Contractions: occasional with moderate UI  A: Membrane intact  P: Report given to RN to contact CNM on call for further instructions  Marny LowensteinJulie N Wenzel, PA-C 03/08/2014 12:25 PM

## 2014-03-09 LAB — CULTURE, BETA STREP (GROUP B ONLY)

## 2014-03-13 ENCOUNTER — Encounter: Payer: Self-pay | Admitting: General Practice

## 2014-03-14 ENCOUNTER — Ambulatory Visit (INDEPENDENT_AMBULATORY_CARE_PROVIDER_SITE_OTHER): Payer: Medicaid Other | Admitting: Advanced Practice Midwife

## 2014-03-14 VITALS — BP 116/64 | HR 85 | Wt 132.8 lb

## 2014-03-14 DIAGNOSIS — R319 Hematuria, unspecified: Secondary | ICD-10-CM

## 2014-03-14 DIAGNOSIS — O0933 Supervision of pregnancy with insufficient antenatal care, third trimester: Secondary | ICD-10-CM

## 2014-03-14 LAB — POCT URINALYSIS DIP (DEVICE)
GLUCOSE, UA: NEGATIVE mg/dL
Ketones, ur: NEGATIVE mg/dL
NITRITE: NEGATIVE
PROTEIN: 30 mg/dL — AB
UROBILINOGEN UA: 4 mg/dL — AB (ref 0.0–1.0)
pH: 6.5 (ref 5.0–8.0)

## 2014-03-14 NOTE — Progress Notes (Signed)
Has US scheduled tomorrow for bilat renal enlargement. Neg GBS. Hgb, leuks in urine. Asymptomatic. Culture sent.

## 2014-03-14 NOTE — Patient Instructions (Signed)
Braxton Hicks Contractions °Contractions of the uterus can occur throughout pregnancy. Contractions are not always a sign that you are in labor.  °WHAT ARE BRAXTON HICKS CONTRACTIONS?  °Contractions that occur before labor are called Braxton Hicks contractions, or false labor. Toward the end of pregnancy (32-34 weeks), these contractions can develop more often and may become more forceful. This is not true labor because these contractions do not result in opening (dilatation) and thinning of the cervix. They are sometimes difficult to tell apart from true labor because these contractions can be forceful and people have different pain tolerances. You should not feel embarrassed if you go to the hospital with false labor. Sometimes, the only way to tell if you are in true labor is for your health care provider to look for changes in the cervix. °If there are no prenatal problems or other health problems associated with the pregnancy, it is completely safe to be sent home with false labor and await the onset of true labor. °HOW CAN YOU TELL THE DIFFERENCE BETWEEN TRUE AND FALSE LABOR? °False Labor °· The contractions of false labor are usually shorter and not as hard as those of true labor.   °· The contractions are usually irregular.   °· The contractions are often felt in the front of the lower abdomen and in the groin.   °· The contractions may go away when you walk around or change positions while lying down.   °· The contractions get weaker and are shorter lasting as time goes on.   °· The contractions do not usually become progressively stronger, regular, and closer together as with true labor.   °True Labor °1. Contractions in true labor last 30-70 seconds, become very regular, usually become more intense, and increase in frequency.   °2. The contractions do not go away with walking.   °3. The discomfort is usually felt in the top of the uterus and spreads to the lower abdomen and low back.   °4. True labor can  be determined by your health care provider with an exam. This will show that the cervix is dilating and getting thinner.   °WHAT TO REMEMBER °· Keep up with your usual exercises and follow other instructions given by your health care provider.   °· Take medicines as directed by your health care provider.   °· Keep your regular prenatal appointments.   °· Eat and drink lightly if you think you are going into labor.   °· If Braxton Hicks contractions are making you uncomfortable:   °· Change your position from lying down or resting to walking, or from walking to resting.   °· Sit and rest in a tub of warm water.   °· Drink 2-3 glasses of water. Dehydration may cause these contractions.   °· Do slow and deep breathing several times an hour.   °WHEN SHOULD I SEEK IMMEDIATE MEDICAL CARE? °Seek immediate medical care if: °· Your contractions become stronger, more regular, and closer together.   °· You have fluid leaking or gushing from your vagina.   °· You have a fever.   °· You pass blood-tinged mucus.   °· You have vaginal bleeding.   °· You have continuous abdominal pain.   °· You have low back pain that you never had before.   °· You feel your baby's head pushing down and causing pelvic pressure.   °· Your baby is not moving as much as it used to.   °Document Released: 02/16/2005 Document Revised: 02/21/2013 Document Reviewed: 11/28/2012 °Exit[employer]® Patient Information ©2015 [employer]. This information is not intended to replace advice given to you by your health care   provider. Make sure you discuss any questions you have with your health care provider. ° °Fetal Movement Counts °Patient Name: __________________________________________________ Patient Due Date: ____________________ °Performing a fetal movement count is highly recommended in high-risk pregnancies, but it is good for every pregnant woman to do. Your health care provider may ask you to start counting fetal movements at 28 weeks of the pregnancy. Fetal  movements often increase: °· After eating a full meal. °· After physical activity. °· After eating or drinking something sweet or cold. °· At rest. °Pay attention to when you feel the baby is most active. This will help you notice a pattern of your baby's sleep and wake cycles and what factors contribute to an increase in fetal movement. It is important to perform a fetal movement count at the same time each day when your baby is normally most active.  °HOW TO COUNT FETAL MOVEMENTS °5. Find a quiet and comfortable area to sit or lie down on your left side. Lying on your left side provides the best blood and oxygen circulation to your baby. °6. Write down the day and time on a sheet of paper or in a journal. °7. Start counting kicks, flutters, swishes, rolls, or jabs in a 2-hour period. You should feel at least 10 movements within 2 hours. °8. If you do not feel 10 movements in 2 hours, wait 2-3 hours and count again. Look for a change in the pattern or not enough counts in 2 hours. °SEEK MEDICAL CARE IF: °· You feel less than 10 counts in 2 hours, tried twice. °· There is no movement in over an hour. °· The pattern is changing or taking longer each day to reach 10 counts in 2 hours. °· You feel the baby is not moving as he or she usually does. °Date: ____________ Movements: ____________ Start time: ____________ Finish time: ____________  °Date: ____________ Movements: ____________ Start time: ____________ Finish time: ____________ °Date: ____________ Movements: ____________ Start time: ____________ Finish time: ____________ °Date: ____________ Movements: ____________ Start time: ____________ Finish time: ____________ °Date: ____________ Movements: ____________ Start time: ____________ Finish time: ____________ °Date: ____________ Movements: ____________ Start time: ____________ Finish time: ____________ °Date: ____________ Movements: ____________ Start time: ____________ Finish time: ____________ °Date: ____________  Movements: ____________ Start time: ____________ Finish time: ____________  °Date: ____________ Movements: ____________ Start time: ____________ Finish time: ____________ °Date: ____________ Movements: ____________ Start time: ____________ Finish time: ____________ °Date: ____________ Movements: ____________ Start time: ____________ Finish time: ____________ °Date: ____________ Movements: ____________ Start time: ____________ Finish time: ____________ °Date: ____________ Movements: ____________ Start time: ____________ Finish time: ____________ °Date: ____________ Movements: ____________ Start time: ____________ Finish time: ____________ °Date: ____________ Movements: ____________ Start time: ____________ Finish time: ____________  °Date: ____________ Movements: ____________ Start time: ____________ Finish time: ____________ °Date: ____________ Movements: ____________ Start time: ____________ Finish time: ____________ °Date: ____________ Movements: ____________ Start time: ____________ Finish time: ____________ °Date: ____________ Movements: ____________ Start time: ____________ Finish time: ____________ °Date: ____________ Movements: ____________ Start time: ____________ Finish time: ____________ °Date: ____________ Movements: ____________ Start time: ____________ Finish time: ____________ °Date: ____________ Movements: ____________ Start time: ____________ Finish time: ____________  °Date: ____________ Movements: ____________ Start time: ____________ Finish time: ____________ °Date: ____________ Movements: ____________ Start time: ____________ Finish time: ____________ °Date: ____________ Movements: ____________ Start time: ____________ Finish time: ____________ °Date: ____________ Movements: ____________ Start time: ____________ Finish time: ____________ °Date: ____________ Movements: ____________ Start time: ____________ Finish time: ____________ °Date: ____________ Movements: ____________ Start time:  ____________ Finish time: ____________ °Date: ____________ Movements:   ____________ Start time: ____________ Finish time: ____________  °Date: ____________ Movements: ____________ Start time: ____________ Finish time: ____________ °Date: ____________ Movements: ____________ Start time: ____________ Finish time: ____________ °Date: ____________ Movements: ____________ Start time: ____________ Finish time: ____________ °Date: ____________ Movements: ____________ Start time: ____________ Finish time: ____________ °Date: ____________ Movements: ____________ Start time: ____________ Finish time: ____________ °Date: ____________ Movements: ____________ Start time: ____________ Finish time: ____________ °Date: ____________ Movements: ____________ Start time: ____________ Finish time: ____________  °Date: ____________ Movements: ____________ Start time: ____________ Finish time: ____________ °Date: ____________ Movements: ____________ Start time: ____________ Finish time: ____________ °Date: ____________ Movements: ____________ Start time: ____________ Finish time: ____________ °Date: ____________ Movements: ____________ Start time: ____________ Finish time: ____________ °Date: ____________ Movements: ____________ Start time: ____________ Finish time: ____________ °Date: ____________ Movements: ____________ Start time: ____________ Finish time: ____________ °Date: ____________ Movements: ____________ Start time: ____________ Finish time: ____________  °Date: ____________ Movements: ____________ Start time: ____________ Finish time: ____________ °Date: ____________ Movements: ____________ Start time: ____________ Finish time: ____________ °Date: ____________ Movements: ____________ Start time: ____________ Finish time: ____________ °Date: ____________ Movements: ____________ Start time: ____________ Finish time: ____________ °Date: ____________ Movements: ____________ Start time: ____________ Finish time: ____________ °Date:  ____________ Movements: ____________ Start time: ____________ Finish time: ____________ °Date: ____________ Movements: ____________ Start time: ____________ Finish time: ____________  °Date: ____________ Movements: ____________ Start time: ____________ Finish time: ____________ °Date: ____________ Movements: ____________ Start time: ____________ Finish time: ____________ °Date: ____________ Movements: ____________ Start time: ____________ Finish time: ____________ °Date: ____________ Movements: ____________ Start time: ____________ Finish time: ____________ °Date: ____________ Movements: ____________ Start time: ____________ Finish time: ____________ °Date: ____________ Movements: ____________ Start time: ____________ Finish time: ____________ °Document Released: 03/18/2006 Document Revised: 07/03/2013 Document Reviewed: 12/14/2011 °ExitCare® Patient Information ©2015 [employer]. This information is not intended to replace advice given to you by your health care provider. Make sure you discuss any questions you have with your health care provider. ° °

## 2014-03-15 ENCOUNTER — Ambulatory Visit (HOSPITAL_COMMUNITY)
Admission: RE | Admit: 2014-03-15 | Discharge: 2014-03-15 | Disposition: A | Discharge status: Home / Self Care | Payer: Medicaid Other | Source: Ambulatory Visit | Attending: Advanced Practice Midwife | Admitting: Advanced Practice Midwife

## 2014-03-15 DIAGNOSIS — O358XX1 Maternal care for other (suspected) fetal abnormality and damage, fetus 1: Secondary | ICD-10-CM | POA: Insufficient documentation

## 2014-03-15 DIAGNOSIS — IMO0001 Reserved for inherently not codable concepts without codable children: Secondary | ICD-10-CM

## 2014-03-15 DIAGNOSIS — O3660X0 Maternal care for excessive fetal growth, unspecified trimester, not applicable or unspecified: Secondary | ICD-10-CM | POA: Insufficient documentation

## 2014-03-15 DIAGNOSIS — Z3A38 38 weeks gestation of pregnancy: Secondary | ICD-10-CM | POA: Insufficient documentation

## 2014-03-15 DIAGNOSIS — IMO0002 Reserved for concepts with insufficient information to code with codable children: Secondary | ICD-10-CM | POA: Insufficient documentation

## 2014-03-16 LAB — CULTURE, OB URINE: Colony Count: >=100000

## 2014-03-21 ENCOUNTER — Ambulatory Visit (INDEPENDENT_AMBULATORY_CARE_PROVIDER_SITE_OTHER): Payer: Medicaid Other | Admitting: Obstetrics and Gynecology

## 2014-03-21 ENCOUNTER — Other Ambulatory Visit: Payer: Self-pay | Admitting: Advanced Practice Midwife

## 2014-03-21 ENCOUNTER — Encounter: Payer: Self-pay | Admitting: Advanced Practice Midwife

## 2014-03-21 VITALS — BP 114/64 | HR 104 | Temp 97.6°F | Wt 133.1 lb

## 2014-03-21 DIAGNOSIS — O2343 Unspecified infection of urinary tract in pregnancy, third trimester: Secondary | ICD-10-CM

## 2014-03-21 DIAGNOSIS — IMO0002 Reserved for concepts with insufficient information to code with codable children: Secondary | ICD-10-CM | POA: Insufficient documentation

## 2014-03-21 DIAGNOSIS — O3660X0 Maternal care for excessive fetal growth, unspecified trimester, not applicable or unspecified: Secondary | ICD-10-CM | POA: Insufficient documentation

## 2014-03-21 DIAGNOSIS — O2340 Unspecified infection of urinary tract in pregnancy, unspecified trimester: Secondary | ICD-10-CM | POA: Insufficient documentation

## 2014-03-21 DIAGNOSIS — O3663X1 Maternal care for excessive fetal growth, third trimester, fetus 1: Secondary | ICD-10-CM

## 2014-03-21 LAB — GLUCOSE, CAPILLARY: Glucose-Capillary: 76 mg/dL (ref 70–99)

## 2014-03-21 MED ORDER — CEPHALEXIN 500 MG PO CAPS: 500.0000 mg | ORAL_CAPSULE | Freq: Four times a day (QID) | ORAL | Status: DC

## 2014-03-21 NOTE — Progress Notes (Signed)
LGA by US at 38.6: 8#5, >90th, AC>97th, AFI 19.4 (80th%ile); 1 hr GCT 139, no 3 hr (vomited drink)> RCBG: 76. (P1 8# at 41 wks, diff FOB.) D/W Dr. Macon LargeAnyanwu> IOL at 40wk for suspected undiagnosed GDM. Fetal kidneys measure normal> advised postnatal US. Did not fill Rx Keflex for E coli ASB> will get today

## 2014-03-21 NOTE — Patient Instructions (Signed)
Labor Induction  Labor induction is when steps are taken to cause a pregnant woman to begin the labor process. Most women go into labor on their own between 37 weeks and 42 weeks of the pregnancy. When this does not happen or when there is a medical need, methods may be used to induce labor. Labor induction causes a pregnant woman's uterus to contract. It also causes the cervix to soften (ripen), open (dilate), and thin out (efface). Usually, labor is not induced before 39 weeks of the pregnancy unless there is a problem with the baby or mother.  Before inducing labor, your health care provider will consider a number of factors, including the following:  The medical condition of you and the baby.   How many weeks along you are.   The status of the baby's lung maturity.   The condition of the cervix.   The position of the baby.  WHAT ARE THE REASONS FOR LABOR INDUCTION? Labor may be induced for the following reasons:  The health of the baby or mother is at risk.   The pregnancy is overdue by 1 week or more.   The water breaks but labor does not start on its own.   The mother has a health condition or serious illness, such as high blood pressure, infection, placental abruption, or diabetes.  The amniotic fluid amounts are low around the baby.   The baby is distressed.  Convenience or wanting the baby to be born on a certain date is not a reason for inducing labor. WHAT METHODS ARE USED FOR LABOR INDUCTION? Several methods of labor induction may be used, such as:   Prostaglandin medicine. This medicine causes the cervix to dilate and ripen. The medicine will also start contractions. It can be taken by mouth or by inserting a suppository into the vagina.   Inserting a thin tube (catheter) with a balloon on the end into the vagina to dilate the cervix. Once inserted, the balloon is expanded with water, which causes the cervix to open.   Stripping the membranes. Your health  care provider separates amniotic sac tissue from the cervix, causing the cervix to be stretched and causing the release of a hormone called progesterone. This may cause the uterus to contract. It is often done during an office visit. You will be sent home to wait for the contractions to begin. You will then come in for an induction.   Breaking the water. Your health care provider makes a hole in the amniotic sac using a small instrument. Once the amniotic sac breaks, contractions should begin. This may still take hours to see an effect.   Medicine to trigger or strengthen contractions. This medicine is given through an IV access tube inserted into a vein in your arm.  All of the methods of induction, besides stripping the membranes, will be done in the hospital. Induction is done in the hospital so that you and the baby can be carefully monitored.  HOW LONG DOES IT TAKE FOR LABOR TO BE INDUCED? Some inductions can take up to 2-3 days. Depending on the cervix, it usually takes less time. It takes longer when you are induced early in the pregnancy or if this is your first pregnancy. If a mother is still pregnant and the induction has been going on for 2-3 days, either the mother will be sent home or a cesarean delivery will be needed. WHAT ARE THE RISKS ASSOCIATED WITH LABOR INDUCTION? Some of the risks of induction   include:   Changes in fetal heart rate, such as too high, too low, or erratic.   Fetal distress.   Chance of infection for the mother and baby.   Increased chance of having a cesarean delivery.   Breaking off (abruption) of the placenta from the uterus (rare).   Uterine rupture (very rare).  When induction is needed for medical reasons, the benefits of induction may outweigh the risks. WHAT ARE SOME REASONS FOR NOT INDUCING LABOR? Labor induction should not be done if:   It is shown that your baby does not tolerate labor.   You have had previous surgeries on your  uterus, such as a myomectomy or the removal of fibroids.   Your placenta lies very low in the uterus and blocks the opening of the cervix (placenta previa).   Your baby is not in a head-down position.   The umbilical cord drops down into the birth canal in front of the baby. This could cut off the baby's blood and oxygen supply.   You have had a previous cesarean delivery.   There are unusual circumstances, such as the baby being extremely premature.  Document Released: 07/08/2006 Document Revised: 10/19/2012 Document Reviewed: 09/15/2012 Exit[employer] Patient Information 2015 [employer]. This information is not intended to replace advice given to you by your health care provider. Make sure you discuss any questions you have with your health care provider.  

## 2014-03-22 ENCOUNTER — Telehealth (HOSPITAL_COMMUNITY): Payer: Self-pay | Admitting: *Deleted

## 2014-03-22 NOTE — Telephone Encounter (Signed)
Preadmission screen  

## 2014-03-23 ENCOUNTER — Inpatient Hospital Stay (HOSPITAL_COMMUNITY)
Admission: RE | Admit: 2014-03-23 | Discharge: 2014-03-25 | DRG: 775 | Disposition: A | Discharge status: Home / Self Care | Payer: Medicaid Other | Source: Ambulatory Visit | Attending: Obstetrics & Gynecology | Admitting: Obstetrics & Gynecology

## 2014-03-23 VITALS — BP 114/67 | HR 72 | Temp 97.9°F | Resp 18 | Ht 61.0 in | Wt 133.0 lb

## 2014-03-23 DIAGNOSIS — Z91040 Latex allergy status: Secondary | ICD-10-CM | POA: Diagnosis not present

## 2014-03-23 DIAGNOSIS — Z87891 Personal history of nicotine dependence: Secondary | ICD-10-CM

## 2014-03-23 DIAGNOSIS — Z3A40 40 weeks gestation of pregnancy: Secondary | ICD-10-CM | POA: Diagnosis present

## 2014-03-23 DIAGNOSIS — O24429 Gestational diabetes mellitus in childbirth, unspecified control: Secondary | ICD-10-CM | POA: Diagnosis present

## 2014-03-23 DIAGNOSIS — O43123 Velamentous insertion of umbilical cord, third trimester: Secondary | ICD-10-CM | POA: Diagnosis present

## 2014-03-23 DIAGNOSIS — O24419 Gestational diabetes mellitus in pregnancy, unspecified control: Secondary | ICD-10-CM | POA: Diagnosis present

## 2014-03-23 DIAGNOSIS — Z3490 Encounter for supervision of normal pregnancy, unspecified, unspecified trimester: Secondary | ICD-10-CM

## 2014-03-23 DIAGNOSIS — O0933 Supervision of pregnancy with insufficient antenatal care, third trimester: Secondary | ICD-10-CM

## 2014-03-23 LAB — CBC
HEMATOCRIT: 31.6 % — AB (ref 36.0–46.0)
HEMOGLOBIN: 10.1 g/dL — AB (ref 12.0–15.0)
MCH: 28.1 pg (ref 26.0–34.0)
MCHC: 32 g/dL (ref 30.0–36.0)
MCV: 88 fL (ref 78.0–100.0)
PLATELETS: 332 10*3/uL (ref 150–400)
RBC: 3.59 MIL/uL — AB (ref 3.87–5.11)
RDW: 13.5 % (ref 11.5–15.5)
WBC: 10.9 10*3/uL — AB (ref 4.0–10.5)

## 2014-03-23 LAB — TYPE AND SCREEN
ABO/RH(D): O POS
ANTIBODY SCREEN: NEGATIVE

## 2014-03-23 MED ORDER — OXYTOCIN BOLUS FROM INFUSION
500.0000 mL | INTRAVENOUS | Status: DC
  Administered 2014-03-23: 500 mL via INTRAVENOUS

## 2014-03-23 MED ORDER — FLEET ENEMA 7-19 GM/118ML RE ENEM: 1.0000 | ENEMA | RECTAL | Status: DC | PRN

## 2014-03-23 MED ORDER — TERBUTALINE SULFATE 1 MG/ML IJ SOLN: 0.2500 mg | Freq: Once | INTRAMUSCULAR | Status: AC | PRN

## 2014-03-23 MED ORDER — CITRIC ACID-SODIUM CITRATE 334-500 MG/5ML PO SOLN: 30.0000 mL | ORAL | Status: DC | PRN

## 2014-03-23 MED ORDER — ZOLPIDEM TARTRATE 5 MG PO TABS: 5.0000 mg | ORAL_TABLET | Freq: Every evening | ORAL | Status: DC | PRN

## 2014-03-23 MED ORDER — OXYCODONE-ACETAMINOPHEN 5-325 MG PO TABS: 2.0000 | ORAL_TABLET | ORAL | Status: DC | PRN

## 2014-03-23 MED ORDER — OXYTOCIN 40 UNITS IN LACTATED RINGERS INFUSION - SIMPLE MED
1.0000 m[IU]/min | INTRAVENOUS | Status: DC
  Administered 2014-03-23: 18 m[IU]/min via INTRAVENOUS
  Administered 2014-03-23: 2 m[IU]/min via INTRAVENOUS
  Filled 2014-03-23: qty 1000

## 2014-03-23 MED ORDER — FENTANYL CITRATE 0.05 MG/ML IJ SOLN
100.0000 ug | INTRAMUSCULAR | Status: DC | PRN
  Administered 2014-03-23 (×6): 100 ug via INTRAVENOUS
  Filled 2014-03-23 (×6): qty 2

## 2014-03-23 MED ORDER — LIDOCAINE HCL (PF) 1 % IJ SOLN
30.0000 mL | INTRAMUSCULAR | Status: DC | PRN
  Filled 2014-03-23: qty 30

## 2014-03-23 MED ORDER — LACTATED RINGERS IV SOLN
INTRAVENOUS | Status: DC
  Administered 2014-03-23: 16:00:00 via INTRAVENOUS

## 2014-03-23 MED ORDER — IBUPROFEN 600 MG PO TABS
600.0000 mg | ORAL_TABLET | Freq: Four times a day (QID) | ORAL | Status: DC
  Administered 2014-03-23 – 2014-03-25 (×7): 600 mg via ORAL
  Filled 2014-03-23 (×7): qty 1

## 2014-03-23 MED ORDER — OXYCODONE-ACETAMINOPHEN 5-325 MG PO TABS: 1.0000 | ORAL_TABLET | ORAL | Status: DC | PRN

## 2014-03-23 MED ORDER — OXYTOCIN 40 UNITS IN LACTATED RINGERS INFUSION - SIMPLE MED: 62.5000 mL/h | INTRAVENOUS | Status: DC

## 2014-03-23 MED ORDER — ACETAMINOPHEN 325 MG PO TABS: 650.0000 mg | ORAL_TABLET | ORAL | Status: DC | PRN

## 2014-03-23 MED ORDER — ONDANSETRON HCL 4 MG/2ML IJ SOLN: 4.0000 mg | Freq: Four times a day (QID) | INTRAMUSCULAR | Status: DC | PRN

## 2014-03-23 MED ORDER — LACTATED RINGERS IV SOLN
500.0000 mL | INTRAVENOUS | Status: DC | PRN
  Administered 2014-03-23: 500 mL via INTRAVENOUS

## 2014-03-23 NOTE — H&P (Signed)
Veronica Brooks is a 19 y.o. female G2P1001 with IUP at 4333w0d presenting for IOL for presumed undiagnosed GDM d/t EFW>90% and failed 1 hour gtt. [facility]  Prenatal History/Complications:  Late care ? GDM  Past Medical History: Past Medical History  Diagnosis Date  . Medical history non-contributory   . Bronchitis   . Heart murmur     Does not require cardiologist    Past Surgical History: Past Surgical History  Procedure Laterality Date  . No past surgeries      Obstetrical History: OB History    Gravida Para Term Preterm AB TAB SAB Ectopic Multiple Living   2 1 1       1       Social History: History   Social History  . Marital Status: Single    Spouse Name: N/A    Number of Children: N/A  . Years of Education: N/A   Social History Main Topics  . Smoking status: Former Smoker    Types: Cigars  . Smokeless tobacco: Never Used  . Alcohol Use: No  . Drug Use: No  . Sexual Activity: Yes    Birth Control/ Protection: None   Other Topics Concern  . Not on file   Social History Narrative    Family History: Family History  Problem Relation Age of Onset  . Diabetes Maternal Grandmother   . Cancer Maternal Grandfather     Allergies: Allergies  Allergen Reactions  . Latex Rash and Other (See Comments)    Reaction:  Burning     Prescriptions prior to admission  Medication Sig Dispense Refill Last Dose  . cephALEXin (KEFLEX) 500 MG capsule Take 1 capsule (500 mg total) by mouth 4 (four) times daily. (Patient not taking: Reported on 03/21/2014) 28 capsule 0 Not Taking  . Prenat-FeFum-FePo-FA-Omega 3 (CONCEPT DHA) 53.5-38-1 MG CAPS Take 1 tablet by mouth daily. (Patient not taking: Reported on 03/21/2014) 30 capsule 2 Not Taking     Prenatal Transfer Tool  Maternal Diabetes: No Maybe?? Genetic Screening: Declined Maternal Ultrasounds/Referrals: Normal Fetal Ultrasounds or other Referrals:  None Maternal Substance Abuse:  No Significant  Maternal Medications:  None Significant Maternal Lab Results: None   Review of Systems   Constitutional: Negative for fever and chills Eyes: Negative for visual disturbances Respiratory: Negative for shortness of breath, dyspnea Cardiovascular: Negative for chest pain or palpitations  Gastrointestinal: Negative for vomiting, diarrhea and constipation Genitourinary: Negative for dysuria and urgency Musculoskeletal: Negative for back pain, joint pain, myalgias  Neurological: Negative for dizziness and headaches    General appearance: alert, cooperative and no distress Lungs: clear to auscultation bilaterally Heart: regular rate and rhythm Abdomen: soft, non-tender; bowel sounds normal Pelvic: /4/20/-3 Extremi//--es: Denna HaggardHomans sign is negative, no sign of DVT DTR's 2+ Presentation: cephalic Fetal monitoringBaseline: 140 bpm, Variability: Good {> 6 bpm), Accelerations: Reactive and Decelerations: Absent Uterine activityNone     Prenatal labs: ABO, Rh: --/--/O POS, O POS (11/11 0115) Antibody: NEG (11/11 0115) Rubella:   immunNON REAC (11/11 0115)  HBsAg: NEGATIVE (11/11 0115)  HIV: NONREACTIVE (11/11 0115)  GBS: Negative (01/06 0000)  1 hr Glucola 139 (didn't do 3 hour) Genetic screening  Too late Anatomy US normal. EFW > 90%   No results found for this or any previous visit (from the past 24 hour(s)).  Assessment: Veronica Brooks is a 19 y.o. G2P1001 with an IUP at 1833w0d presenting for IOL for presumed GDM d/t EFW >90%  Plan: #Labor:  pitocin #Pain:  Iv vs epidural #FWB cat 1 #ID: GBS: neg  #MOF:  breast #MOC: nexplanon #Circ: OP   CRESENZO-DISHMAN,Belina Mandile 03/23/2014, 7:55 AM

## 2014-03-23 NOTE — Progress Notes (Signed)
Veronica GoreDeja Cast is a 19 y.o. G2P1001 at 10364w0d by admitted for induction of labor due to possible Gestational diabetes.  Subjective: AROM @ approximately 1830. Contractions became stronger soon after AROM.  Objective: BP 102/69 mmHg  Pulse 100  Temp(Src) 98 F (36.7 C) (Oral)  Resp 18  Ht 5\' 1"  (1.549 m)  Wt 60.328 kg (133 lb)  BMI 25.14 kg/m2  LMP 07/19/2013 (Exact Date)      FHT:  FHR: 150 bpm, variability: moderate,  accelerations:  Present,  decelerations:  Absent SVE:   Dilation: 5 Effacement (%): 90 Station: -2 Exam by:: Pincus BadderK Shaw CNM  Labs: Lab Results  Component Value Date   WBC 10.9* 03/23/2014   HGB 10.1* 03/23/2014   HCT 31.6* 03/23/2014   MCV 88.0 03/23/2014   PLT 332 03/23/2014    Assessment / Plan: Induction of labor due to gestational diabetes,  progressing well on pitocin  Labor: on pitocin, s/p AROM  Fetal Wellbeing:  Category I Pain Control:  Fentanyl Anticipated MOD:  NSVD  Rolm BookbinderMoss, Amber 03/23/2014, 6:38 PM   I have seen and examined this patient and I agree with the above. Cam HaiSHAW, KIMBERLY CNM 9:37 PM 03/23/2014

## 2014-03-23 NOTE — Progress Notes (Signed)
Epifania GoreDeja Caloca is a 19 y.o. G2P1001 at 3463w0d admitted for induction of labor due to likely undiagnosed Gestational diabetes.  Subjective:   Objective: BP 142/90 mmHg  Pulse 85  Temp(Src) 98 F (36.7 C) (Oral)  Resp 18  Ht 5\' 1"  (1.549 m)  Wt 60.328 kg (133 lb)  BMI 25.14 kg/m2  LMP 07/19/2013 (Exact Date)      FHT:  FHR: 145 bpm, variability: moderate,  accelerations:  Present,  decelerations:  Absent SVE:   Dilation: 4 Effacement (%): 70 Station: -3 Exam by:: Campbell SoupS Earl RNC  Labs: Lab Results  Component Value Date   WBC 10.9* 03/23/2014   HGB 10.1* 03/23/2014   HCT 31.6* 03/23/2014   MCV 88.0 03/23/2014   PLT 332 03/23/2014    Assessment / Plan: Induction of labor due to gestational diabetes,  progressing well on pitocin  Labor: Progressing on Pitocin, will continue to increase then AROM Fetal Wellbeing:  Category I Pain Control:  Fentanyl Anticipated MOD:  NSVD  Rolm BookbinderMoss, Etienne Millward 03/23/2014, 3:32 PM

## 2014-03-24 ENCOUNTER — Encounter (HOSPITAL_COMMUNITY): Payer: Self-pay

## 2014-03-24 LAB — RAPID URINE DRUG SCREEN, HOSP PERFORMED
AMPHETAMINES: NOT DETECTED
BARBITURATES: NOT DETECTED
BENZODIAZEPINES: NOT DETECTED
Cocaine: NOT DETECTED
Opiates: NOT DETECTED
TETRAHYDROCANNABINOL: NOT DETECTED

## 2014-03-24 LAB — RPR: RPR Ser Ql: NONREACTIVE

## 2014-03-24 MED ORDER — ZOLPIDEM TARTRATE 5 MG PO TABS: 5.0000 mg | ORAL_TABLET | Freq: Every evening | ORAL | Status: DC | PRN

## 2014-03-24 MED ORDER — BENZOCAINE-MENTHOL 20-0.5 % EX AERO: 1.0000 "application " | INHALATION_SPRAY | CUTANEOUS | Status: DC | PRN

## 2014-03-24 MED ORDER — DIPHENHYDRAMINE HCL 25 MG PO CAPS: 25.0000 mg | ORAL_CAPSULE | Freq: Four times a day (QID) | ORAL | Status: DC | PRN

## 2014-03-24 MED ORDER — LANOLIN HYDROUS EX OINT: TOPICAL_OINTMENT | CUTANEOUS | Status: DC | PRN

## 2014-03-24 MED ORDER — OXYCODONE-ACETAMINOPHEN 5-325 MG PO TABS
1.0000 | ORAL_TABLET | ORAL | Status: DC | PRN
  Administered 2014-03-24 – 2014-03-25 (×2): 1 via ORAL
  Filled 2014-03-24 (×2): qty 1

## 2014-03-24 MED ORDER — TETANUS-DIPHTH-ACELL PERTUSSIS 5-2.5-18.5 LF-MCG/0.5 IM SUSP: 0.5000 mL | Freq: Once | INTRAMUSCULAR | Status: DC

## 2014-03-24 MED ORDER — SENNOSIDES-DOCUSATE SODIUM 8.6-50 MG PO TABS
2.0000 | ORAL_TABLET | ORAL | Status: DC
  Administered 2014-03-24 (×2): 2 via ORAL
  Filled 2014-03-24 (×2): qty 2

## 2014-03-24 MED ORDER — ONDANSETRON HCL 4 MG/2ML IJ SOLN: 4.0000 mg | INTRAMUSCULAR | Status: DC | PRN

## 2014-03-24 MED ORDER — OXYCODONE-ACETAMINOPHEN 5-325 MG PO TABS: 2.0000 | ORAL_TABLET | ORAL | Status: DC | PRN

## 2014-03-24 MED ORDER — SIMETHICONE 80 MG PO CHEW: 80.0000 mg | CHEWABLE_TABLET | ORAL | Status: DC | PRN

## 2014-03-24 MED ORDER — METHYLERGONOVINE MALEATE 0.2 MG PO TABS: 0.2000 mg | ORAL_TABLET | ORAL | Status: DC | PRN

## 2014-03-24 MED ORDER — WITCH HAZEL-GLYCERIN EX PADS: 1.0000 "application " | MEDICATED_PAD | CUTANEOUS | Status: DC | PRN

## 2014-03-24 MED ORDER — OXYTOCIN 40 UNITS IN LACTATED RINGERS INFUSION - SIMPLE MED: 62.5000 mL/h | INTRAVENOUS | Status: DC | PRN

## 2014-03-24 MED ORDER — ONDANSETRON HCL 4 MG PO TABS: 4.0000 mg | ORAL_TABLET | ORAL | Status: DC | PRN

## 2014-03-24 MED ORDER — MEASLES, MUMPS & RUBELLA VAC ~~LOC~~ INJ
0.5000 mL | INJECTION | Freq: Once | SUBCUTANEOUS | Status: DC
  Filled 2014-03-24: qty 0.5

## 2014-03-24 MED ORDER — METHYLERGONOVINE MALEATE 0.2 MG/ML IJ SOLN: 0.2000 mg | INTRAMUSCULAR | Status: DC | PRN

## 2014-03-24 MED ORDER — ALBUTEROL SULFATE (2.5 MG/3ML) 0.083% IN NEBU: 3.0000 mL | INHALATION_SOLUTION | Freq: Four times a day (QID) | RESPIRATORY_TRACT | Status: DC | PRN

## 2014-03-24 MED ORDER — DIBUCAINE 1 % RE OINT: 1.0000 "application " | TOPICAL_OINTMENT | RECTAL | Status: DC | PRN

## 2014-03-24 NOTE — Progress Notes (Signed)
Post Partum Day +1 Subjective: no complaints, up ad lib, voiding and tolerating PO  Objective: Blood pressure 122/67, pulse 97, temperature 97.5 F (36.4 C), temperature source Oral, resp. rate 20, height 5\' 1"  (1.549 m), weight 60.328 kg (133 lb), last menstrual period 07/19/2013, SpO2 98 %, unknown if currently breastfeeding.  Physical Exam:  General: alert, cooperative and no distress Lochia: appropriate Uterine Fundus: soft Incision: na DVT Evaluation: No evidence of DVT seen on physical exam.   Recent Labs  03/23/14 0815  HGB 10.1*  HCT 31.6*    Assessment/Plan: Plan for discharge tomorrow and Contraception Mirena. Bottle feeding. Outpatient circ.   LOS: 1 day   Veronica Brooks, Veronica Brooks 03/24/2014, 7:26 AM

## 2014-03-25 NOTE — Discharge Summary (Signed)
Obstetric Discharge Summary Reason for Admission: induction of labor for presumed undiagnosed GDM Prenatal Procedures: none 2/2 late to care Intrapartum Procedures: spontaneous vaginal delivery Postpartum Procedures: none Complications-Operative and Postpartum: none  Delivery Note At 10:59 PM a viable female was delivered via Vaginal, Spontaneous Delivery (Presentation: Left Occiput Anterior). APGAR: 9, 9; weight pending .  Placenta status: Intact, Spontaneous. Cord: 3 vessels with the following complications: marginal insertion.   Anesthesia: None  Episiotomy: None Lacerations: Labial Suture Repair: none Est. Blood Loss (mL): 150  Mom to postpartum. Baby to Couplet care / Skin to Skin   Delivery supervised by Veronica Brooks, Veronica Brooks   Hospital Course:  Active Problems:   Pregnancy   Veronica Brooks is a 19 y.o. [id number] s/p SVD.  IOL 2/2 macrosomia on sono, failed 1h gtt and suspected gDM. She has postpartum course that was uncomplicated including no problems with ambulating, PO intake, urination, pain, or bleeding. The pt feels ready to go home and  will be discharged with outpatient follow-up.   Today: No acute events overnight.  Pt denies problems with ambulating, voiding or po intake.  She denies nausea or vomiting.  Pain is well controlled. .  Plan for birth control is  nexplanon.  Method of Feeding: breast   H/H: Lab Results  Component Value Date/Time   HGB 10.1* 03/23/2014 08:15 AM   HCT 31.6* 03/23/2014 08:15 AM    Discharge Diagnoses: Term Pregnancy-delivered  Discharge Information: Date: 03/25/2014 Activity: pelvic rest Diet: routine  Medications: None Breast feeding:  Yes Condition: stable Instructions: refer to handout Discharge to: home      Medication List    ASK your doctor about these medications        albuterol 108 (90 BASE) MCG/ACT inhaler  Commonly known as:  PROVENTIL HFA;VENTOLIN HFA  Inhale 2 puffs into the lungs every 6 (six) hours as needed  for wheezing or shortness of breath.     cephALEXin 500 MG capsule  Commonly known as:  KEFLEX  Take 1 capsule (500 mg total) by mouth 4 (four) times daily.     CONCEPT DHA 53.5-38-1 MG Caps  Take 1 tablet by mouth daily.         Perry MountACOSTA,Aleera Gilcrease ROCIO ,MD OB Fellow 03/25/2014,7:49 AM

## 2014-03-25 NOTE — Discharge Instructions (Signed)

## 2014-03-25 NOTE — Progress Notes (Signed)
Clinical Social Work Department PSYCHOSOCIAL ASSESSMENT - MATERNAL/CHILD 03/25/2014  Patient:  Veronica Brooks, Veronica Brooks  Account Number:  [id number]  [place] Date:  03/23/2014  Ardine Eng Name:   Veronica Brooks    Clinical Social Worker:  Kaiea Esselman, LCSW   Date/Time:  03/25/2014 12:45 PM  Date Referred:  03/24/2014   Referral source  Central Nurs[facility]     Referred reason  Young Mother   Other referral source:    I:  FAMILY / HOME ENVIRONMENT Child's legal guardian:    Guardian - Name Guardian - Age Guardian - Address  Veronica Brooks,Veronica Brooks [place].   [place], [place] [address]  Brooks, Veronica Brooks 17 same as above   Other household support members/support persons Other support:   Maternal and paternal grandparents    II  PSYCHOSOCIAL DATA Information Source:    Occupational hygienist Employment:   Supported by Development worker, international aid resources:  Medicaid If [place] / Grade:   Maternity Care Coordinator / Child Services Coordination / Early Interventions:  Cultural issues impacting care:    III  STRENGTHS Strengths  Supportive family/friends  Home prepared for Child (including basic supplies)  Adequate Resources   Strength comment:    IV  RISK FACTORS AND CURRENT PROBLEMS Current Problem:       V  SOCIAL WORK ASSESSMENT Met with mother who was pleasant and receptive to CSW.  She is a 19 year old single parent with one other dependent age 32.     Informed that she and FOB reside with maternal grandmother and paternal grandfather.   FOB is still in high school and mother communicate intent to complete her HS education or obtain her GED.   Mother states that she is well prepared for newborn at home and have adequate support.  She denies any hx of substance abuse or mental illness.  Spoke with mother regarding family planning to avoid future unplanned pregnancies.  She was receptive to the information.  Also encouraged  her to complete her education.      VI SOCIAL WORK PLAN Social Work Plan  No Further Intervention Required / No Barriers to Discharge   Type of pt/family education:   Importance of Family Planning   If child protective services report - county:   If child protective services report - date:   Information/referral to community resources comment:   Referred to [employer]

## 2014-03-26 NOTE — Progress Notes (Signed)
Ur chart review completed.  

## 2014-05-02 ENCOUNTER — Ambulatory Visit: Payer: Medicaid Other | Admitting: Obstetrics & Gynecology

## 2014-07-10 NOTE — H&P (Signed)
L&D Evaluation:  History Expanded:   HPI 19 yo G1 with EDD of 03/06/12 per 16 week US, presents at 36 weeks with c/o pain in abdomen and back that caused her to wake up suddenly. Denies regular contractions, LOF or VB. +FM. PNC at [facility] notable for +UTI, late entry to care at 15 weeks, EFW of 13% by US at 28 weeks.    Blood Type (Maternal) O positive    Group B Strep Results Maternal (Result >5wks must be treated as unknown) unknown/result > 5 weeks ago    Maternal HIV Negative    Maternal Syphilis Ab Nonreactive    Maternal Varicella Immune    Rubella Results (Maternal) immune    Maternal T-Dap Nonimmune    Patient's Medical History No Chronic Illness    Patient's Surgical History none    Medications Pre Natal Vitamins    Allergies latex    Social History none   ROS:   ROS see HPI   Exam:   Vital Signs stable    General no apparent distress    Mental Status clear    Abdomen gravid, non-tender    Estimated Fetal Weight Average for gestational age    Pelvic no external lesions, fingertip per RN    Mebranes Intact    FHT normal rate with no decels, BL 135 with mod variability, + accels, no decels    Ucx irregular   Impression:   Impression IUP at 36 weeks with discomforts of pregnancy   Plan:   Plan discharge    Comments Reassured pt that symptoms seem to be r/t possible muscle spasm or discomforts of pregnancy. Reviewed signs of labor.    Follow Up Appointment already scheduled. 02/10/12   Electronic Signatures: Vella KohlerBrothers, Fynley Chrystal K (CNM)  (Signed 09-Dec-13 04:31)  Authored: L&D Evaluation   Last Updated: 09-Dec-13 04:31 by Vella KohlerBrothers, Darneisha Windhorst K (CNM)

## 2014-07-10 NOTE — H&P (Signed)
L&D Evaluation:  History Expanded:   HPI 19 yo G1at 29w 4d, EDD of 03/07/11 by 16 week US, presents with c/o gush of fluid that got her underwear wet and increased pressure. PNC at [facility] notable for +UTI, late entry to care at 15 weeks.    Blood Type (Maternal) O positive    Group B Strep Results Maternal (Result >5wks must be treated as unknown) unknown/result > 5 weeks ago    Maternal HIV Negative    Maternal Syphilis Ab Nonreactive    Maternal Varicella Immune    Rubella Results (Maternal) immune    Maternal T-Dap Nonimmune    Patient's Medical History No Chronic Illness    Patient's Surgical History none    Medications Pre Natal Vitamins    Allergies latex    Social History none   ROS:   ROS see HPI   Exam:   Vital Signs stable    General no apparent distress    Mental Status clear    Abdomen gravid, non-tender    Estimated Fetal Weight Average for gestational age    Pelvic no external lesions, cervix closed and thick    Mebranes Intact, fern negative, nitrizine negative    FHT appropriate for gestational age    Ucx absent    Other wet mount: + yeast   Impression:   Impression IUP at 29 weeks, vulvovaginal yeast   Plan:   Plan discharge    Comments Rx for Diflucan 150 mg x2 given Encourage fluids/fiber as stool palpated on vaginal exam    Follow Up Appointment already scheduled   Electronic Signatures: Vella KohlerBrothers, Evola Hollis K (CNM)  (Signed 24-Oct-13 13:38)  Authored: L&D Evaluation   Last Updated: 24-Oct-13 13:38 by Vella KohlerBrothers, Mariela Rex K (CNM)

## 2014-07-10 NOTE — H&P (Signed)
L&D Evaluation:  History Expanded:   HPI 19 yo G1 with EDD of 03/06/12 per 16 week US, presents at 2140 6/7weeks with c/o contractions. Cervix changed from 2.5 to 3 cm this am per RN check. Denies LOF or VB. +FM. PNC at [facility] notable for +UTI, late entry to care at 15 weeks, EFW of 13% by US at 28 weeks.    Blood Type (Maternal) O positive    Group B Strep Results Maternal (Result >5wks must be treated as unknown) negative    Maternal HIV Negative    Maternal Syphilis Ab Nonreactive    Maternal Varicella Immune    Rubella Results (Maternal) immune    Maternal T-Dap Nonimmune    Patient's Medical History No Chronic Illness    Patient's Surgical History none    Medications Pre Natal Vitamins  Iron    Allergies latex    Social History none   ROS:   ROS see HPI   Exam:   Vital Signs stable    General no apparent distress    Mental Status clear    Chest clear    Heart no murmur/gallop/rubs    Abdomen gravid, non-tender    Estimated Fetal Weight Average for gestational age, EFW 6 1/2 lbs    Pelvic 3 cm per RN    Mebranes Intact    FHT normal rate with no decels    Ucx regular, q 2-5 minutes   Impression:   Impression early labor   Plan:   Comments Admission for labor. Encouraged ambulation to progress labor. Will augment prn as pt is postdates. Undecided about epidural/pain management options.    Follow Up Appointment already scheduled. 02/10/12   Electronic Signatures: Vella KohlerBrothers, Cheree Fowles K (CNM)  (Signed 11-Jan-14 08:38)  Authored: L&D Evaluation   Last Updated: 11-Jan-14 08:38 by Vella KohlerBrothers, Janya Eveland K (CNM)

## 2014-10-26 ENCOUNTER — Emergency Department (INDEPENDENT_AMBULATORY_CARE_PROVIDER_SITE_OTHER)
Admission: EM | Admit: 2014-10-26 | Discharge: 2014-10-26 | Disposition: A | Discharge status: Home / Self Care | Payer: Self-pay | Source: Home / Self Care | Attending: Family Medicine | Admitting: Family Medicine

## 2014-10-26 ENCOUNTER — Encounter (HOSPITAL_COMMUNITY): Payer: Self-pay

## 2014-10-26 ENCOUNTER — Other Ambulatory Visit (HOSPITAL_COMMUNITY)
Admission: RE | Admit: 2014-10-26 | Discharge: 2014-10-26 | Disposition: A | Discharge status: Home / Self Care | Payer: Medicaid Other | Source: Ambulatory Visit | Attending: Family Medicine | Admitting: Family Medicine

## 2014-10-26 DIAGNOSIS — N898 Other specified noninflammatory disorders of vagina: Secondary | ICD-10-CM

## 2014-10-26 DIAGNOSIS — K6289 Other specified diseases of anus and rectum: Secondary | ICD-10-CM

## 2014-10-26 DIAGNOSIS — N390 Urinary tract infection, site not specified: Secondary | ICD-10-CM

## 2014-10-26 DIAGNOSIS — K644 Residual hemorrhoidal skin tags: Secondary | ICD-10-CM

## 2014-10-26 DIAGNOSIS — N760 Acute vaginitis: Secondary | ICD-10-CM

## 2014-10-26 DIAGNOSIS — K648 Other hemorrhoids: Secondary | ICD-10-CM

## 2014-10-26 DIAGNOSIS — Z113 Encounter for screening for infections with a predominantly sexual mode of transmission: Secondary | ICD-10-CM | POA: Diagnosis present

## 2014-10-26 LAB — POCT URINALYSIS DIP (DEVICE)
BILIRUBIN URINE: NEGATIVE
Glucose, UA: NEGATIVE mg/dL
HGB URINE DIPSTICK: NEGATIVE
Ketones, ur: NEGATIVE mg/dL
LEUKOCYTES UA: NEGATIVE
Nitrite: POSITIVE — AB
Protein, ur: NEGATIVE mg/dL
Specific Gravity, Urine: 1.025 (ref 1.005–1.030)
Urobilinogen, UA: 1 mg/dL (ref 0.0–1.0)
pH: 6.5 (ref 5.0–8.0)

## 2014-10-26 LAB — POCT PREGNANCY, URINE: Preg Test, Ur: NEGATIVE

## 2014-10-26 MED ORDER — METRONIDAZOLE 500 MG PO TABS: 500.0000 mg | ORAL_TABLET | Freq: Two times a day (BID) | ORAL | Status: DC

## 2014-10-26 MED ORDER — LIDOCAINE-HYDROCORTISONE ACE 3-0.5 % RE CREA: 1.0000 | TOPICAL_CREAM | Freq: Two times a day (BID) | RECTAL | Status: DC

## 2014-10-26 MED ORDER — CEPHALEXIN 500 MG PO CAPS: 500.0000 mg | ORAL_CAPSULE | Freq: Four times a day (QID) | ORAL | Status: DC

## 2014-10-26 NOTE — Discharge Instructions (Signed)
Bacterial Vaginosis Bacterial vaginosis is an infection of the vagina. It happens when too many of certain germs (bacteria) grow in the vagina. HOME CARE  Take your medicine as told by your doctor.  Finish your medicine even if you start to feel better.  Do not have sex until you finish your medicine and are better.  Tell your sex partner that you have an infection. They should see their doctor for treatment.  Practice safe sex. Use condoms. Have only one sex partner. GET HELP IF:  You are not getting better after 3 days of treatment.  You have more grey fluid (discharge) coming from your vagina than before.  You have more pain than before.  You have a fever. MAKE SURE YOU:   Understand these instructions.  Will watch your condition.  Will get help right away if you are not doing well or get worse. Document Released: 11/26/2007 Document Revised: 12/07/2012 Document Reviewed: 09/28/2012 [facility] Patient Information 2015 [place], [place]. This information is not intended to replace advice given to you by your health care provider. Make sure you discuss any questions you have with your health care provider.  Hemorrhoids Hemorrhoids are puffy (swollen) veins around the rectum or anus. Hemorrhoids can cause pain, itching, bleeding, or irritation. HOME CARE  Eat foods with fiber, such as whole grains, beans, nuts, fruits, and vegetables. Ask your doctor about taking products with added fiber in them (fibersupplements).  Drink enough fluid to keep your pee (urine) clear or pale yellow.  Exercise often.  Go to the bathroom when you have the urge to poop. Do not wait.  Avoid straining to poop (bowel movement).  Keep the butt area dry and clean. Use wet toilet paper or moist paper towels.  Medicated creams and medicine inserted into the anus (anal suppository) may be used or applied as told.  Only take medicine as told by your doctor.  Take a warm water bath (sitz bath) for  15-20 minutes to ease pain. Do this 3-4 times a day.  Place ice packs on the area if it is tender or puffy. Use the ice packs between the warm water baths.  Put ice in a plastic bag.  Place a towel between your skin and the bag.  Leave the ice on for 15-20 minutes, 03-04 times a day.  Do not use a donut-shaped pillow or sit on the toilet for a long time. GET HELP RIGHT AWAY IF:   You have more pain that is not controlled by treatment or medicine.  You have bleeding that will not stop.  You have trouble or are unable to poop (bowel movement).  You have pain or puffiness outside the area of the hemorrhoids. MAKE SURE YOU:   Understand these instructions.  Will watch your condition.  Will get help right away if you are not doing well or get worse. Document Released: 11/26/2007 Document Revised: 02/03/2012 Document Reviewed: 12/29/2011 [facility] Patient Information 2015 [place], [place]. This information is not intended to replace advice given to you by your health care provider. Make sure you discuss any questions you have with your health care provider.  Urinary Tract Infection A urinary tract infection (UTI) can occur any place along the urinary tract. The tract includes the kidneys, ureters, bladder, and urethra. A type of germ called bacteria often causes a UTI. UTIs are often helped with antibiotic medicine.  HOME CARE   If given, take antibiotics as told by your doctor. Finish them even if you start to  feel better.  Drink enough fluids to keep your pee (urine) clear or pale yellow.  Avoid tea, drinks with caffeine, and bubbly (carbonated) drinks.  Pee often. Avoid holding your pee in for a long time.  Pee before and after having sex (intercourse).  Wipe from front to back after you poop (bowel movement) if you are a woman. Use each tissue only once. GET HELP RIGHT AWAY IF:   You have back pain.  You have lower belly (abdominal) pain.  You have chills.  You feel  sick to your stomach (nauseous).  You throw up (vomit).  Your burning or discomfort with peeing does not go away.  You have a fever.  Your symptoms are not better in 3 days. MAKE SURE YOU:   Understand these instructions.  Will watch your condition.  Will get help right away if you are not doing well or get worse. Document Released: 08/05/2007 Document Revised: 11/11/2011 Document Reviewed: 09/17/2011 [facility] Patient Information 2015 [place], [place]. This information is not intended to replace advice given to you by your health care provider. Make sure you discuss any questions you have with your health care provider.  Vaginitis Vaginitis is an inflammation of the vagina. It is most often caused by a change in the normal balance of the bacteria and yeast that live in the vagina. This change in balance causes an overgrowth of certain bacteria or yeast, which causes the inflammation. There are different types of vaginitis, but the most common types are:  Bacterial vaginosis.  Yeast infection (candidiasis).  Trichomoniasis vaginitis. This is a sexually transmitted infection (STI).  Viral vaginitis.  Atropic vaginitis.  Allergic vaginitis. CAUSES  The cause depends on the type of vaginitis. Vaginitis can be caused by:  Bacteria (bacterial vaginosis).  Yeast (yeast infection).  A parasite (trichomoniasis vaginitis)  A virus (viral vaginitis).  Low hormone levels (atrophic vaginitis). Low hormone levels can occur during pregnancy, breastfeeding, or after menopause.  Irritants, such as bubble baths, scented tampons, and feminine sprays (allergic vaginitis). Other factors can change the normal balance of the yeast and bacteria that live in the vagina. These include:  Antibiotic medicines.  Poor hygiene.  Diaphragms, vaginal sponges, spermicides, birth control pills, and intrauterine devices (IUD).  Sexual intercourse.  Infection.  Uncontrolled diabetes.  A  weakened immune system. SYMPTOMS  Symptoms can vary depending on the cause of the vaginitis. Common symptoms include:  Abnormal vaginal discharge.  The discharge is white, gray, or yellow with bacterial vaginosis.  The discharge is thick, white, and cheesy with a yeast infection.  The discharge is frothy and yellow or greenish with trichomoniasis.  A bad vaginal odor.  The odor is fishy with bacterial vaginosis.  Vaginal itching, pain, or swelling.  Painful intercourse.  Pain or burning when urinating. Sometimes, there are no symptoms. TREATMENT  Treatment will vary depending on the type of infection.   Bacterial vaginosis and trichomoniasis are often treated with antibiotic creams or pills.  Yeast infections are often treated with antifungal medicines, such as vaginal creams or suppositories.  Viral vaginitis has no cure, but symptoms can be treated with medicines that relieve discomfort. Your sexual partner should be treated as well.  Atrophic vaginitis may be treated with an estrogen cream, pill, suppository, or vaginal ring. If vaginal dryness occurs, lubricants and moisturizing creams may help. You may be told to avoid scented soaps, sprays, or douches.  Allergic vaginitis treatment involves quitting the use of the product that is causing the problem.  Vaginal creams can be used to treat the symptoms. HOME CARE INSTRUCTIONS   Take all medicines as directed by your caregiver.  Keep your genital area clean and dry. Avoid soap and only rinse the area with water.  Avoid douching. It can remove the healthy bacteria in the vagina.  Do not use tampons or have sexual intercourse until your vaginitis has been treated. Use sanitary pads while you have vaginitis.  Wipe from front to back. This avoids the spread of bacteria from the rectum to the vagina.  Let air reach your genital area.  Wear cotton underwear to decrease moisture buildup.  Avoid wearing underwear while you  sleep until your vaginitis is gone.  Avoid tight pants and underwear or nylons without a cotton panel.  Take off wet clothing (especially bathing suits) as soon as possible.  Use mild, non-scented products. Avoid using irritants, such as:  Scented feminine sprays.  Fabric softeners.  Scented detergents.  Scented tampons.  Scented soaps or bubble baths.  Practice safe sex and use condoms. Condoms may prevent the spread of trichomoniasis and viral vaginitis. SEEK MEDICAL CARE IF:   You have abdominal pain.  You have a fever or persistent symptoms for more than 2-3 days.  You have a fever and your symptoms suddenly get worse. Document Released: 12/14/2006 Document Revised: 11/11/2011 Document Reviewed: 07/30/2011 [facility] Patient Information 2015 [place], [place]. This information is not intended to replace advice given to you by your health care provider. Make sure you discuss any questions you have with your health care provider.

## 2014-10-26 NOTE — ED Notes (Signed)
C/o 2 week duration of vaginal burning when urinates or has BM, and a vaginal D/C. Unprotected sex

## 2014-10-26 NOTE — ED Provider Notes (Signed)
CSN: [id number]     Arrival date & time 10/26/14  1328 History   First MD Initiated Contact with Patient 10/26/14 1531     Chief Complaint  Patient presents with  . Vaginal Itching  . Rash   (Consider location/radiation/quality/duration/timing/severity/associated sxs/prior Treatment) HPI Comments: 19 year old female complaining of a rash and pain in her "private areas. Denies itching. She states she may have a vaginal discharge but uncertain. She states there is pain only when she urinates and she occasionally has urinary frequency for approximately one month. The pain is most noticed and worsened with palpation and urination.  Patient is a 19 y.o. female presenting with vaginal itching and rash.  Vaginal Itching This is a new problem. The current episode started more than 1 week ago. The problem occurs hourly. The problem has been gradually worsening. Pertinent negatives include no chest pain, no abdominal pain, no headaches and no shortness of breath. Nothing aggravates the symptoms. Nothing relieves the symptoms. She has tried nothing for the symptoms. The treatment provided no relief.  Rash Associated symptoms: no abdominal pain, no headaches and no shortness of breath     Past Medical History  Diagnosis Date  . Medical history non-contributory   . Bronchitis   . Heart murmur     Does not require cardiologist   Past Surgical History  Procedure Laterality Date  . No past surgeries     Family History  Problem Relation Age of Onset  . Diabetes Maternal Grandmother   . Cancer Maternal Grandfather    Social History  Substance Use Topics  . Smoking status: Former Smoker    Types: Cigars  . Smokeless tobacco: Never Used  . Alcohol Use: No   OB History    Gravida Para Term Preterm AB TAB SAB Ectopic Multiple Living   0 2     Review of Systems  Constitutional: Negative.   Respiratory: Negative for cough and shortness of breath.   Cardiovascular: Negative for  chest pain.  Gastrointestinal: Negative.  Negative for abdominal pain.  Genitourinary: Positive for dysuria, frequency, vaginal discharge and genital sores. Negative for urgency, flank pain, menstrual problem and pelvic pain.  Musculoskeletal: Negative.   Neurological: Negative for headaches.    Allergies  Latex  Home Medications   Prior to Admission medications   Medication Sig Start Date End Date Taking? Authorizing Provider  albuterol (PROVENTIL HFA;VENTOLIN HFA) 108 (90 BASE) MCG/ACT inhaler Inhale 2 puffs into the lungs every 6 (six) hours as needed for wheezing or shortness of breath.    Historical Provider, MD  cephALEXin (KEFLEX) 500 MG capsule Take 1 capsule (500 mg total) by mouth 4 (four) times daily. 10/26/14   Hayden Rasmussen, NP  lidocaine-hydrocortisone (ANAMANTEL HC) 3-0.5 % CREA Place 1 Applicatorful rectally 2 (two) times daily. 10/26/14   Hayden Rasmussen, NP  metroNIDAZOLE (FLAGYL) 500 MG tablet Take 1 tablet (500 mg total) by mouth 2 (two) times daily. X 7 days 10/26/14   Hayden Rasmussen, NP   Meds Ordered and Administered this Visit  Medications - No data to display  BP 124/82 mmHg  Pulse 64  Temp(Src) 98 F (36.7 C) (Oral)  Resp 20  SpO2 100%  LMP 10/14/2014 (Exact Date) No data found.   Physical Exam  Constitutional: She is oriented to person, place, and time. She appears well-developed and well-nourished. No distress.  Eyes: EOM are normal.  Neck: Normal range of motion. Neck supple.  Cardiovascular: Normal  rate.   Pulmonary/Chest: Effort normal. No respiratory distress.  Genitourinary:  Normal external female genitalia There is a thin white discharge draining from the vagina. External exam to the urethra and intraoral structures reveals no erythema no swelling no abnormal lesions. There is a moderate amount of thin white discharge coating the vaginal walls and cervix. Cervix is position for left. Ectocervix is pink to erythematous in patches.  Anal exam is difficult  as the patient has problems cooperating. The mere separation of buttock for visual exam causes pain in the patient to request stopping the exam or withdrawal. No external lesions are seen. No erythema. No drainage from the rectum. Palpation reveals a small thickening to and approximately half a centimeter of the anal rim. DRE was not possible due to pain and patient's withdrawal to examination.  Musculoskeletal: She exhibits no edema.  Neurological: She is alert and oriented to person, place, and time. She exhibits normal muscle tone.  Skin: Skin is warm and dry.  Psychiatric: She has a normal mood and affect.  Nursing note and vitals reviewed.   ED Course  Procedures (including critical care time)  Labs Review Labs Reviewed  POCT URINALYSIS DIP (DEVICE) - Abnormal; Notable for the following:    Nitrite POSITIVE (*)    All other components within normal limits  URINE CULTURE  POCT PREGNANCY, URINE  CERVICOVAGINAL ANCILLARY ONLY   Results for orders placed or performed during the hospital encounter of 10/26/14  POCT urinalysis dip (device)  Result Value Ref Range   Glucose, UA NEGATIVE NEGATIVE mg/dL   Bilirubin Urine NEGATIVE NEGATIVE   Ketones, ur NEGATIVE NEGATIVE mg/dL   Specific Gravity, Urine 1.025 1.005 - 1.030   Hgb urine dipstick NEGATIVE NEGATIVE   pH 6.5 5.0 - 8.0   Protein, ur NEGATIVE NEGATIVE mg/dL   Urobilinogen, UA 1.0 0.0 - 1.0 mg/dL   Nitrite POSITIVE (A) NEGATIVE   Leukocytes, UA NEGATIVE NEGATIVE  Pregnancy, urine POC  Result Value Ref Range   Preg Test, Ur NEGATIVE NEGATIVE     Imaging Review No results found.   Visual Acuity Review  Right Eye Distance:   Left Eye Distance:   Bilateral Distance:    Right Eye Near:   Left Eye Near:    Bilateral Near:         MDM   1. Vaginal discharge   2. UTI (lower urinary tract infection)   3. Vaginitis   4. Anal or rectal pain   5. External hemorrhoid    Keflex as dir Flagyl as dir anamantle  to anus for pain as dir Cytology pending    Hayden Rasmussen, NP 10/26/14 1604

## 2014-10-28 LAB — URINE CULTURE
Culture: >=100000
Special Requests: NORMAL

## 2014-10-29 LAB — CERVICOVAGINAL ANCILLARY ONLY
Chlamydia: NEGATIVE
Neisseria Gonorrhea: NEGATIVE

## 2014-10-29 NOTE — ED Notes (Addendum)
GC, chlamydia report negative, still waiting on wet prep report. UA culture positive for UTI. Medication written for UTI day of visit sufficient for treatment UTI w organism ID in sensitivity report. Called to patient to advise of report

## 2014-10-30 LAB — CERVICOVAGINAL ANCILLARY ONLY: Wet Prep (BD Affirm): POSITIVE — AB

## 2014-10-30 NOTE — ED Notes (Signed)
Final report of wet prep positive for BV; treatment adequate w Rx on day of UCC visit, called patient to discuss

## 2014-12-14 ENCOUNTER — Encounter: Payer: Self-pay | Admitting: Intensive Care

## 2014-12-14 ENCOUNTER — Emergency Department: Payer: Medicaid Other

## 2014-12-14 ENCOUNTER — Emergency Department
Admission: EM | Admit: 2014-12-14 | Discharge: 2014-12-14 | Disposition: A | Discharge status: Home / Self Care | Payer: Medicaid Other | Attending: Emergency Medicine | Admitting: Emergency Medicine

## 2014-12-14 DIAGNOSIS — Z87891 Personal history of nicotine dependence: Secondary | ICD-10-CM | POA: Diagnosis not present

## 2014-12-14 DIAGNOSIS — Z79899 Other long term (current) drug therapy: Secondary | ICD-10-CM | POA: Diagnosis not present

## 2014-12-14 DIAGNOSIS — Z91040 Latex allergy status: Secondary | ICD-10-CM | POA: Diagnosis not present

## 2014-12-14 DIAGNOSIS — R51 Headache: Secondary | ICD-10-CM | POA: Diagnosis not present

## 2014-12-14 DIAGNOSIS — Z3202 Encounter for pregnancy test, result negative: Secondary | ICD-10-CM | POA: Diagnosis not present

## 2014-12-14 DIAGNOSIS — R569 Unspecified convulsions: Secondary | ICD-10-CM | POA: Diagnosis not present

## 2014-12-14 DIAGNOSIS — Z792 Long term (current) use of antibiotics: Secondary | ICD-10-CM | POA: Insufficient documentation

## 2014-12-14 LAB — COMPREHENSIVE METABOLIC PANEL
ALK PHOS: 50 U/L (ref 38–126)
ALT: 16 U/L (ref 14–54)
AST: 23 U/L (ref 15–41)
Albumin: 4.5 g/dL (ref 3.5–5.0)
Anion gap: 8 (ref 5–15)
BILIRUBIN TOTAL: 1 mg/dL (ref 0.3–1.2)
BUN: 11 mg/dL (ref 6–20)
CO2: 25 mmol/L (ref 22–32)
CREATININE: 0.79 mg/dL (ref 0.44–1.00)
Calcium: 9.3 mg/dL (ref 8.9–10.3)
Chloride: 106 mmol/L (ref 101–111)
Glucose, Bld: 98 mg/dL (ref 65–99)
POTASSIUM: 3.3 mmol/L — AB (ref 3.5–5.1)
Sodium: 139 mmol/L (ref 135–145)
Total Protein: 7.4 g/dL (ref 6.5–8.1)

## 2014-12-14 LAB — URINE DRUG SCREEN, QUALITATIVE (ARMC ONLY)
Amphetamines, Ur Screen: NOT DETECTED
Barbiturates, Ur Screen: NOT DETECTED
Benzodiazepine, Ur Scrn: NOT DETECTED
CANNABINOID 50 NG, UR ~~LOC~~: NOT DETECTED
COCAINE METABOLITE, UR ~~LOC~~: NOT DETECTED
MDMA (ECSTASY) UR SCREEN: NOT DETECTED
Methadone Scn, Ur: NOT DETECTED
Opiate, Ur Screen: NOT DETECTED
Phencyclidine (PCP) Ur S: NOT DETECTED
TRICYCLIC, UR SCREEN: NOT DETECTED

## 2014-12-14 LAB — CBC
HEMATOCRIT: 38 % (ref 35.0–47.0)
HEMOGLOBIN: 12.4 g/dL (ref 12.0–16.0)
MCH: 30.4 pg (ref 26.0–34.0)
MCHC: 32.7 g/dL (ref 32.0–36.0)
MCV: 93 fL (ref 80.0–100.0)
PLATELETS: 253 10*3/uL (ref 150–440)
RBC: 4.09 MIL/uL (ref 3.80–5.20)
RDW: 13.1 % (ref 11.5–14.5)
WBC: 5.6 10*3/uL (ref 3.6–11.0)

## 2014-12-14 LAB — URINALYSIS COMPLETE WITH MICROSCOPIC (ARMC ONLY)
Bilirubin Urine: NEGATIVE
GLUCOSE, UA: NEGATIVE mg/dL
Hgb urine dipstick: NEGATIVE
Ketones, ur: NEGATIVE mg/dL
Leukocytes, UA: NEGATIVE
NITRITE: NEGATIVE
Protein, ur: NEGATIVE mg/dL
SPECIFIC GRAVITY, URINE: 1.006 (ref 1.005–1.030)
pH: 7 (ref 5.0–8.0)

## 2014-12-14 LAB — GLUCOSE, CAPILLARY: Glucose-Capillary: 99 mg/dL (ref 65–99)

## 2014-12-14 LAB — HCG, QUANTITATIVE, PREGNANCY

## 2014-12-14 MED ORDER — ACETAMINOPHEN 325 MG PO TABS
650.0000 mg | ORAL_TABLET | Freq: Once | ORAL | Status: AC
  Administered 2014-12-14: 650 mg via ORAL
  Filled 2014-12-14: qty 2

## 2014-12-14 NOTE — ED Provider Notes (Addendum)
-----------------------------------------   4:00 PM on 12/14/2014 -----------------------------------------   Blood pressure 125/85, pulse 77, temperature 97.7 F (36.5 C), temperature source Oral, resp. rate 19, height 5\' 2"  (1.575 m), weight 113 lb (51.256 kg), last menstrual period 11/14/2014, SpO2 100 %, unknown if currently breastfeeding.  Assuming care from Dr. Fanny BienQuale.  In short, Veronica Brooks is a 19 y.o. female with a chief complaint of Seizures .  Refer to the original H&P for additional details.  The current plan of care is to follow-up the CT scan, urinalysis, and reassess.  ----------------------------------------- 4:13 PM on 12/14/2014 -----------------------------------------  The patient is well-appearing and feels fine except for a mild headache.  I am giving her Tylenol 650 mg by mouth.  I discussed with her and her mother everything that Dr. Fanny BienQuale already spoke with them about, including first-time seizure precautions and follow-up recommendations.  We will discharge her within the next 30 minutes or so she remains well appearing and generally asymptomatic.  The patient and her family understand and agree with this plan.   ----------------------------------------- 4:30 PM on 12/14/2014 -----------------------------------------  The patient has bacteriuria but no urine wbc's, nitrites negative, no leukocyte esterase.  I discussed it with the patient and she has no dysuria or increased frequency.  I will send the urine to culture but we will not treat empirically with antibiotics at this time.  The patient and the family understand and agree with his plan.  Veronica Roseory Kolby Schara, MD 12/14/14 [phone]

## 2014-12-14 NOTE — ED Notes (Signed)
Patient transported to CT 

## 2014-12-14 NOTE — ED Provider Notes (Signed)
[facility] Emergency Department Provider Note REMINDER - THIS NOTE IS NOT A FINAL MEDICAL RECORD UNTIL IT IS SIGNED. UNTIL THEN, THE CONTENT BELOW MAY REFLECT INFORMATION FROM A DOCUMENTATION TEMPLATE, NOT THE ACTUAL PATIENT VISIT. ____________________________________________  Time seen: Approximately 2:51 PM  I have reviewed the triage vital signs and the nursing notes.   HISTORY  Chief Complaint Seizures    HPI Veronica Brooks is a 19 y.o. female reports no previous medical history other than bronchitis and allergy latex.  Patient reports she was in her normal state of health today, she had gone to the store with her mother and was just smoking a [place] cigarette when she returned to the couch and then awoke in the back and balance. She does not know what happened, but EMS report that the patient's mother had witnessed a possible short shaking episode or seizure. Patient reports she has no history of seizure, but she does feel that she is having a mild throbbing headache, and feels tired right now.  No numbness or tingling, no recent infection. She denies any drug use although she has used marijuana in the past but not recently. She does smoke. She denies pain. She otherwise feels well except she feels tired at this time.   Past Medical History  Diagnosis Date  . Medical history non-contributory   . Bronchitis   . Heart murmur     Does not require cardiologist    Patient Active Problem List   Diagnosis Date Noted  . Pregnancy 03/23/2014  . UTI in pregnancy 03/21/2014  . Suspected macroscopic fetus 03/21/2014  . LGA (large for gestational age) fetus affecting management of mother 03/21/2014  . Fetal renal anomaly   . Fetal macrosomia   . [redacted] weeks gestation of pregnancy   . High risk teen pregnancy in third trimester 02/14/2014  . [redacted] weeks gestation of pregnancy   . Late prenatal care 01/17/2014    Past Surgical History  Procedure Laterality Date   . No past surgeries      Current Outpatient Rx  Name  Route  Sig  Dispense  Refill  . albuterol (PROVENTIL HFA;VENTOLIN HFA) 108 (90 BASE) MCG/ACT inhaler   Inhalation   Inhale 2 puffs into the lungs every 6 (six) hours as needed for wheezing or shortness of breath.         . cephALEXin (KEFLEX) 500 MG capsule   Oral   Take 1 capsule (500 mg total) by mouth 4 (four) times daily.   28 capsule   0   . lidocaine-hydrocortisone (ANAMANTEL HC) 3-0.5 % CREA   Rectal   Place 1 Applicatorful rectally 2 (two) times daily.   7 g   0   . metroNIDAZOLE (FLAGYL) 500 MG tablet   Oral   Take 1 tablet (500 mg total) by mouth 2 (two) times daily. X 7 days   14 tablet   0     Allergies Latex  Family History  Problem Relation Age of Onset  . Diabetes Maternal Grandmother   . Cancer Maternal Grandfather     Social History Social History  Substance Use Topics  . Smoking status: Former Smoker    Types: Cigars  . Smokeless tobacco: Never Used  . Alcohol Use: No    Review of Systems Constitutional: No fever/chills Eyes: No visual changes. ENT: No sore throat. Cardiovascular: Denies chest pain. Respiratory: Denies shortness of breath. Gastrointestinal: No abdominal pain.  No nausea, no vomiting.  No diarrhea.  No constipation. Genitourinary: Negative for dysuria. Musculoskeletal: Negative for back pain. Skin: Negative for rash. Neurological: Negative for focal weakness or numbness.    10-point ROS otherwise negative.  ____________________________________________   PHYSICAL EXAM:  VITAL SIGNS: ED Triage Vitals  Enc Vitals Group     BP 12/14/14 1433 138/94 mmHg     Pulse Rate 12/14/14 1433 92     Resp 12/14/14 1433 16     Temp 12/14/14 1433 97.7 F (36.5 C)     Temp Source 12/14/14 1433 Oral     SpO2 12/14/14 1432 98 %     Weight 12/14/14 1433 113 lb (51.256 kg)     Height 12/14/14 1433 5\' 2"  (1.575 m)     Head Cir --      Peak Flow --      Pain Score --       Pain Loc --      Pain Edu? --      Excl. in GC? --    Constitutional: Alert and oriented. Well appearing and in no acute distress. Eyes: Conjunctivae are normal. PERRL. EOMI. Head: Atraumatic. Nose: No congestion/rhinnorhea. Mouth/Throat: Mucous membranes are moist.  Oropharynx non-erythematous. Neck: No stridor.  No cervical spine tenderness. Cardiovascular: Normal rate, regular rhythm. Grossly normal heart sounds.  Good peripheral circulation. Respiratory: Normal respiratory effort.  No retractions. Lungs CTAB. Gastrointestinal: Soft and nontender. No distention. No abdominal bruits. No CVA tenderness. Musculoskeletal: No lower extremity tenderness nor edema.  No joint effusions. Neurologic:  Normal speech and language. No gross focal neurologic deficits are appreciated. There is no pronator drift. Normal cranial nerve exam. 5 out of 5 strength in all extremities. Skin:  Skin is warm, dry and intact. No rash noted. Psychiatric: Mood and affect are normal. Speech and behavior are normal.  Patient essentially has a normal exam at this time.  ____________________________________________   LABS (all labs ordered are listed, but only abnormal results are displayed)  Labs Reviewed  COMPREHENSIVE METABOLIC PANEL - Abnormal; Notable for the following:    Potassium 3.3 (*)    All other components within normal limits  CBC  HCG, QUANTITATIVE, PREGNANCY  URINALYSIS COMPLETEWITH MICROSCOPIC ([facility] ONLY)  URINE DRUG SCREEN, QUALITATIVE ([facility] ONLY)  CBG MONITORING, ED   ____________________________________________  EKG  Reviewed and interpreted by me EKG time 1440 Normal sinus rhythm, there is incomplete right bundle branch block with borderline right axis deviation There is no evidence of acute ischemic change, there is minimal T-wave inversion seen in V2 which may be associated with incomplete right bundle-branch block QTc is 450, this is normal for a young female PR 140 Heart  rate 90  No evidence of prolonged QT, Brugada, or WPW ____________________________________________  RADIOLOGY  CT head is pending at the time of sign out ____________________________________________   PROCEDURES  Procedure(s) performed: None  Critical Care performed: No  ____________________________________________   INITIAL IMPRESSION / ASSESSMENT AND PLAN / ED COURSE  Pertinent labs & imaging results that were available during my care of the patient were reviewed by me and considered in my medical decision making (see chart for details).  Patient presents after a sudden onset of possible seizure. This was witnessed by the patient's mother reported by EMS as brief, but noted in all extremities. The patient is very nontoxic and well-appearing on exam and the ER. She did receive naloxone with EMS, and we will observe her though she denies any drug use. She did smoke a cigarette which she states was  store-bought [place] just previous to this happening, but she denies any drug use. We will send a drug of abuse screen, but based on the patient's history and exam at this time I do not think she had any sort of narcotic type overdose.  We will observe the patient for minimum of the next hour and half in the ER, to evaluate for recent sedation in the event that this was somehow related to know. The patient absolutely denies it.  Appears most consistent with a first time seizure, etiology unclear. Obtain urinalysis, labs, and CT of the head.   ----------------------------------------- 3:24 PM on 12/14/2014 -----------------------------------------  Patient resting calmly inappropriately. Ongoing care and disposition assigned to Dr. York Cerise, my primary impressions is likely represents a first time seizure. He will follow-up on CT head, urinalysis and drug screen, will plan to observe the patient to at least 4:30 PM she was given naloxone, though nothing in her history at this point clearly  suggest any sedative or opioid use, drug screen is pending. ____________________________________________   FINAL CLINICAL IMPRESSION(S) / ED DIAGNOSES  Final diagnoses:  Seizure (HCC)      Sharyn Creamer, MD 12/14/14 1525

## 2014-12-14 NOTE — ED Notes (Signed)
Patient comes in via EMS for seizure that was witnessed by mother. Mother called EMS.  Per EMS when they arrived patient was unresponsive to sternal rub.  20g IV started in left AC and narcan 1mg  IV given.  Per EMS patient was responsive and talking within 2 minutes after narcan administered. When patient able to answer question, patient verbalized that she "bummed a cigarette from someone at the store" and after smoking cigarette she doesn't remember anything else.  Patient denies history of seizures and drug abuse.

## 2014-12-14 NOTE — Discharge Instructions (Signed)
Although we cannot be certain, we believe you've may have had a first time seizure today. It is very important that you have very close follow-up care with neurology and your primary care doctor. Please follow-up, and set up an appointment by calling.  No driving or being in places where if he were to have another seizure you could fall or potentially harm yourself. No working around heavy or Public affairs consultant.  You have been seen in the emergency department today for a likely seizure.  Your workup today including labs are reassuring.  Please follow up with your doctor as soon as possible regarding today's emergency department visit and your likely seizure.  You will also need to follow up with a neurologist as soon as possible, please call for appointment.  As we have discussed it is very important that you DO NOT drive until you have been seen and cleared by your neurologist.  Please drink plenty of fluids, get plenty of sleep and avoid any alcohol or drug use.  Return to the emergency department if you have any further seizures, develop any weakness/numbness of any arm/leg, confusion, slurred speech, or sudden/severe headache.     Seizure, Adult A seizure is abnormal electrical activity in the brain. Seizures usually last from 30 seconds to 2 minutes. There are various types of seizures. Before a seizure, you may have a warning sensation (aura) that a seizure is about to occur. An aura may include the following symptoms:   Fear or anxiety.  Nausea.  Feeling like the room is spinning (vertigo).  Vision changes, such as seeing flashing lights or spots. Common symptoms during a seizure include:  A change in attention or behavior (altered mental status).  Convulsions with rhythmic jerking movements.  Drooling.  Rapid eye movements.  Grunting.  Loss of bladder and bowel control.  Bitter taste in the mouth.  Tongue biting. After a seizure, you may feel confused and sleepy.  You may also have an injury resulting from convulsions during the seizure. HOME CARE INSTRUCTIONS   If you are given medicines, take them exactly as prescribed by your health care provider.  Keep all follow-up appointments as directed by your health care provider.  Do not swim or drive or engage in risky activity during which a seizure could cause further injury to you or others until your health care provider says it is OK.  Get adequate rest.  Teach friends and family what to do if you have a seizure. They should:  Lay you on the ground to prevent a fall.  Put a cushion under your head.  Loosen any tight clothing around your neck.  Turn you on your side. If vomiting occurs, this helps keep your airway clear.  Stay with you until you recover.  Know whether or not you need emergency care. SEEK IMMEDIATE MEDICAL CARE IF:  The seizure lasts longer than 5 minutes.  The seizure is severe or you do not wake up immediately after the seizure.  You have an altered mental status after the seizure.  You are having more frequent or worsening seizures. Someone should drive you to the emergency department or call local emergency services (911 in [place] MAKE SURE YOU:  Understand these instructions.  Will watch your condition.  Will get help right away if you are not doing well or get worse.   This information is not intended to replace advice given to you by your health care provider. Make sure you discuss any questions you have  with your health care provider.   Document Released: 02/14/2000 Document Revised: 03/09/2014 Document Reviewed: 09/28/2012 Elsevier Interactive Patient Education [employer]2016 [employer]

## 2014-12-16 LAB — URINE CULTURE
Culture: >=100000
Special Requests: NORMAL

## 2014-12-18 ENCOUNTER — Telehealth: Payer: Self-pay | Admitting: Pharmacist

## 2014-12-18 ENCOUNTER — Encounter: Payer: Self-pay | Admitting: Emergency Medicine

## 2014-12-18 ENCOUNTER — Observation Stay
Admission: EM | Admit: 2014-12-18 | Discharge: 2014-12-20 | Disposition: A | Discharge status: Home / Self Care | Payer: Medicaid Other | Attending: Internal Medicine | Admitting: Internal Medicine

## 2014-12-18 DIAGNOSIS — R531 Weakness: Secondary | ICD-10-CM | POA: Insufficient documentation

## 2014-12-18 DIAGNOSIS — O2340 Unspecified infection of urinary tract in pregnancy, unspecified trimester: Secondary | ICD-10-CM | POA: Diagnosis not present

## 2014-12-18 DIAGNOSIS — G40909 Epilepsy, unspecified, not intractable, without status epilepticus: Secondary | ICD-10-CM | POA: Diagnosis not present

## 2014-12-18 DIAGNOSIS — I959 Hypotension, unspecified: Secondary | ICD-10-CM | POA: Diagnosis not present

## 2014-12-18 DIAGNOSIS — R600 Localized edema: Secondary | ICD-10-CM | POA: Diagnosis not present

## 2014-12-18 DIAGNOSIS — Z79899 Other long term (current) drug therapy: Secondary | ICD-10-CM | POA: Diagnosis not present

## 2014-12-18 DIAGNOSIS — Z87891 Personal history of nicotine dependence: Secondary | ICD-10-CM | POA: Insufficient documentation

## 2014-12-18 DIAGNOSIS — R011 Cardiac murmur, unspecified: Secondary | ICD-10-CM | POA: Diagnosis not present

## 2014-12-18 DIAGNOSIS — Z91040 Latex allergy status: Secondary | ICD-10-CM | POA: Diagnosis not present

## 2014-12-18 DIAGNOSIS — Z3A31 31 weeks gestation of pregnancy: Secondary | ICD-10-CM | POA: Diagnosis not present

## 2014-12-18 DIAGNOSIS — O99353 Diseases of the nervous system complicating pregnancy, third trimester: Secondary | ICD-10-CM | POA: Diagnosis not present

## 2014-12-18 DIAGNOSIS — G4489 Other headache syndrome: Secondary | ICD-10-CM

## 2014-12-18 DIAGNOSIS — F17210 Nicotine dependence, cigarettes, uncomplicated: Secondary | ICD-10-CM | POA: Diagnosis not present

## 2014-12-18 DIAGNOSIS — Z833 Family history of diabetes mellitus: Secondary | ICD-10-CM | POA: Diagnosis not present

## 2014-12-18 DIAGNOSIS — R569 Unspecified convulsions: Secondary | ICD-10-CM

## 2014-12-18 DIAGNOSIS — R51 Headache: Secondary | ICD-10-CM | POA: Diagnosis present

## 2014-12-18 DIAGNOSIS — Z7951 Long term (current) use of inhaled steroids: Secondary | ICD-10-CM | POA: Diagnosis not present

## 2014-12-18 DIAGNOSIS — R32 Unspecified urinary incontinence: Secondary | ICD-10-CM | POA: Insufficient documentation

## 2014-12-18 DIAGNOSIS — E876 Hypokalemia: Secondary | ICD-10-CM

## 2014-12-18 DIAGNOSIS — Z23 Encounter for immunization: Secondary | ICD-10-CM | POA: Diagnosis not present

## 2014-12-18 DIAGNOSIS — Z809 Family history of malignant neoplasm, unspecified: Secondary | ICD-10-CM | POA: Insufficient documentation

## 2014-12-18 DIAGNOSIS — R519 Headache, unspecified: Secondary | ICD-10-CM

## 2014-12-18 DIAGNOSIS — N390 Urinary tract infection, site not specified: Secondary | ICD-10-CM

## 2014-12-18 MED ORDER — SODIUM CHLORIDE 0.9 % IV SOLN
1000.0000 mg | Freq: Once | INTRAVENOUS | Status: AC
  Administered 2014-12-19: 1000 mg via INTRAVENOUS
  Filled 2014-12-18: qty 10

## 2014-12-18 NOTE — Telephone Encounter (Signed)
Rx for cephalexin 500mg  bid x 10 days was called into [facility] in [place]. Authorized by Dr. Phineas SemenGraydon Goodman. Pt called and informed that urine cx was positive for UTI. atibiotics called into pharmacy of choice.

## 2014-12-18 NOTE — ED Notes (Addendum)
Pt to triage via w/c with no distress noted; pt reports having a seizure this morning; c/o generalized HA x 1-2weeks; pt seen here recently for same [facility]

## 2014-12-18 NOTE — ED Notes (Signed)
MD at bedside. 

## 2014-12-19 ENCOUNTER — Observation Stay: Payer: Medicaid Other

## 2014-12-19 DIAGNOSIS — R51 Headache: Secondary | ICD-10-CM

## 2014-12-19 DIAGNOSIS — E876 Hypokalemia: Secondary | ICD-10-CM

## 2014-12-19 DIAGNOSIS — I959 Hypotension, unspecified: Secondary | ICD-10-CM

## 2014-12-19 DIAGNOSIS — G40909 Epilepsy, unspecified, not intractable, without status epilepticus: Secondary | ICD-10-CM

## 2014-12-19 DIAGNOSIS — G4489 Other headache syndrome: Secondary | ICD-10-CM

## 2014-12-19 DIAGNOSIS — N390 Urinary tract infection, site not specified: Secondary | ICD-10-CM

## 2014-12-19 DIAGNOSIS — R519 Headache, unspecified: Secondary | ICD-10-CM

## 2014-12-19 LAB — URINALYSIS COMPLETE WITH MICROSCOPIC (ARMC ONLY)
Bilirubin Urine: NEGATIVE
Glucose, UA: NEGATIVE mg/dL
Hgb urine dipstick: NEGATIVE
Ketones, ur: NEGATIVE mg/dL
Nitrite: NEGATIVE
Protein, ur: NEGATIVE mg/dL
Specific Gravity, Urine: 1.029 (ref 1.005–1.030)
pH: 5 (ref 5.0–8.0)

## 2014-12-19 LAB — URINE DRUG SCREEN, QUALITATIVE (ARMC ONLY)
Amphetamines, Ur Screen: NOT DETECTED
Barbiturates, Ur Screen: NOT DETECTED
Benzodiazepine, Ur Scrn: NOT DETECTED
Cannabinoid 50 Ng, Ur ~~LOC~~: NOT DETECTED
Cocaine Metabolite,Ur ~~LOC~~: NOT DETECTED
MDMA (Ecstasy)Ur Screen: NOT DETECTED
Methadone Scn, Ur: NOT DETECTED
Opiate, Ur Screen: NOT DETECTED
Phencyclidine (PCP) Ur S: NOT DETECTED
Tricyclic, Ur Screen: NOT DETECTED

## 2014-12-19 LAB — CBC
HCT: 39.4 % (ref 35.0–47.0)
Hemoglobin: 12.8 g/dL (ref 12.0–16.0)
MCH: 30.2 pg (ref 26.0–34.0)
MCHC: 32.4 g/dL (ref 32.0–36.0)
MCV: 93.2 fL (ref 80.0–100.0)
PLATELETS: 267 10*3/uL (ref 150–440)
RBC: 4.23 MIL/uL (ref 3.80–5.20)
RDW: 13.2 % (ref 11.5–14.5)
WBC: 7.6 10*3/uL (ref 3.6–11.0)

## 2014-12-19 LAB — COMPREHENSIVE METABOLIC PANEL
ALK PHOS: 50 U/L (ref 38–126)
ALT: 15 U/L (ref 14–54)
ANION GAP: 7 (ref 5–15)
AST: 19 U/L (ref 15–41)
Albumin: 4.8 g/dL (ref 3.5–5.0)
BUN: 9 mg/dL (ref 6–20)
CALCIUM: 9.5 mg/dL (ref 8.9–10.3)
CHLORIDE: 106 mmol/L (ref 101–111)
CO2: 27 mmol/L (ref 22–32)
Creatinine, Ser: 0.75 mg/dL (ref 0.44–1.00)
GFR calc non Af Amer: >60 mL/min (ref >60)
Glucose, Bld: 88 mg/dL (ref 65–99)
Potassium: 3.6 mmol/L (ref 3.5–5.1)
SODIUM: 140 mmol/L (ref 135–145)
Total Bilirubin: 0.3 mg/dL (ref 0.3–1.2)
Total Protein: 7.9 g/dL (ref 6.5–8.1)

## 2014-12-19 LAB — HEMOGLOBIN A1C: Hgb A1c MFr Bld: 5.4 % (ref 4.0–6.0)

## 2014-12-19 LAB — HCG, QUANTITATIVE, PREGNANCY

## 2014-12-19 LAB — TSH: TSH: 0.463 u[IU]/mL (ref 0.350–4.500)

## 2014-12-19 MED ORDER — INFLUENZA VAC SPLIT QUAD 0.5 ML IM SUSY
0.5000 mL | PREFILLED_SYRINGE | INTRAMUSCULAR | Status: AC
  Administered 2014-12-20: 12:00:00 0.5 mL via INTRAMUSCULAR
  Filled 2014-12-19: qty 0.5

## 2014-12-19 MED ORDER — LEVETIRACETAM 500 MG PO TABS: 500.0000 mg | ORAL_TABLET | Freq: Two times a day (BID) | ORAL | Status: DC

## 2014-12-19 MED ORDER — SODIUM CHLORIDE 0.9 % IV SOLN
INTRAVENOUS | Status: DC
  Administered 2014-12-19 (×2): via INTRAVENOUS

## 2014-12-19 MED ORDER — ONDANSETRON HCL 4 MG/2ML IJ SOLN: 4.0000 mg | Freq: Four times a day (QID) | INTRAMUSCULAR | Status: DC | PRN

## 2014-12-19 MED ORDER — ACETAMINOPHEN 650 MG RE SUPP: 650.0000 mg | Freq: Four times a day (QID) | RECTAL | Status: DC | PRN

## 2014-12-19 MED ORDER — PNEUMOCOCCAL VAC POLYVALENT 25 MCG/0.5ML IJ INJ
0.5000 mL | INJECTION | INTRAMUSCULAR | Status: AC
  Administered 2014-12-20: 12:00:00 0.5 mL via INTRAMUSCULAR
  Filled 2014-12-19: qty 0.5

## 2014-12-19 MED ORDER — ONDANSETRON HCL 4 MG PO TABS
4.0000 mg | ORAL_TABLET | Freq: Four times a day (QID) | ORAL | Status: DC | PRN
  Administered 2014-12-19: 4 mg via ORAL
  Filled 2014-12-19: qty 1

## 2014-12-19 MED ORDER — FOSFOMYCIN TROMETHAMINE 3 G PO PACK
3.0000 g | PACK | Freq: Once | ORAL | Status: AC
  Administered 2014-12-19: 13:00:00 3 g via ORAL
  Filled 2014-12-19: qty 3

## 2014-12-19 MED ORDER — ENOXAPARIN SODIUM 40 MG/0.4ML ~~LOC~~ SOLN
40.0000 mg | SUBCUTANEOUS | Status: DC
  Administered 2014-12-19 (×2): 40 mg via SUBCUTANEOUS
  Filled 2014-12-19 (×2): qty 0.4

## 2014-12-19 MED ORDER — HYDROCODONE-ACETAMINOPHEN 5-325 MG PO TABS
1.0000 | ORAL_TABLET | Freq: Once | ORAL | Status: AC
  Administered 2014-12-19: 1 via ORAL
  Filled 2014-12-19: qty 1

## 2014-12-19 MED ORDER — ACETAMINOPHEN 325 MG PO TABS
650.0000 mg | ORAL_TABLET | Freq: Four times a day (QID) | ORAL | Status: DC | PRN
  Administered 2014-12-19 (×2): 650 mg via ORAL
  Filled 2014-12-19 (×2): qty 2

## 2014-12-19 MED ORDER — LEVETIRACETAM 500 MG PO TABS
500.0000 mg | ORAL_TABLET | Freq: Two times a day (BID) | ORAL | Status: DC
  Administered 2014-12-19 – 2014-12-20 (×3): 500 mg via ORAL
  Filled 2014-12-19 (×3): qty 1

## 2014-12-19 MED ORDER — GADOBENATE DIMEGLUMINE 529 MG/ML IV SOLN
10.0000 mL | Freq: Once | INTRAVENOUS | Status: AC | PRN
  Administered 2014-12-19: 10:00:00 10 mL via INTRAVENOUS

## 2014-12-19 NOTE — Plan of Care (Signed)
Problem: Discharge Progression Outcomes Goal: Other Discharge Outcomes/Goals Outcome: Progressing Plan of care progress to goal: Pt very tired and weak.  Just wants to sleep. Bedrails padded. VSS Complains of headache.  Tylenol given. Pt asked to call when she needs to get up. Awaiting Neurology consult.

## 2014-12-19 NOTE — Plan of Care (Signed)
Problem: Discharge Progression Outcomes Goal: Other Discharge Outcomes/Goals Outcome: Progressing Plan of care progress to goal for: 1. Pain-pt c/o headache, prn meds given with improvement, pt resting comfortably in bed 2. Hemodynamically-             -VSS, afebrile, IVF continue per orders 3. Complications-no evidence of  4. Diet-pt tolerating diet  5. Activity-pt in to bathroom assisted, mother at bedside

## 2014-12-19 NOTE — Plan of Care (Signed)
Problem: Discharge Progression Outcomes Goal: Discharge plan in place and appropriate Likes to be called Deji. Lives at home with Mother. Has history of bronchitis and heart murmur.

## 2014-12-19 NOTE — Discharge Instructions (Addendum)
No driving prior to out pt neurology follow up.  Acupuncture Acupuncture is a technique that is used in traditional [place] medical treatment. Traditional [place] medicine recognizes more than 2,000 points on the body that connect energy pathways (meridians) through the body. Acupuncture stimulates these points with needles that are inserted through your skin. The goal is to balance the physical, emotional, and mental energy in your body. This treatment is done by a health care provider who has specialized training (licensed acupuncture practitioner). You may have this treatment for many reasons. For instance, many people have acupuncture to treat long-term or short-term pain. Others have acupuncture to treat conditions such as addiction, headaches, and arthritis. Some have it to help them recover from a stroke. Treatment often requires several acupuncture sessions. You may have acupuncture along with other medical treatments. LET [facility] CARE PROVIDER KNOW ABOUT:  Any allergies you have.  All medicines you are taking, including vitamins, herbs, eye drops, creams, and over-the-counter medicines.  Any blood disorders you have.  Previous surgeries you have had.  Any medical conditions you may have. RISKS AND COMPLICATIONS Generally, this is a safe procedure. However, problems may occur, including:  Skin infection.  Damage to organs or structures beneath the skin. BEFORE THE PROCEDURE  Your acupuncture practitioner will ask about your medical history and your symptoms.  You may have a physical exam. PROCEDURE  Your skin will be cleaned with a germ-killing (antiseptic) solution.  Your acupuncture practitioner will open a new set of germ-free (sterile) needles.  The needles will be inserted in your skin. They will be left in place for a certain length of time. You may feel slight pain or a tingling sensation.  Your acupuncture practitioner may apply electrical energy to the  needles.  Your acupuncture practitioner may adjust the needles in certain ways.  After your procedure, the acupuncture practitioner will remove the needles, throw them away, and clean your skin. The exact procedure that you have will depend on your condition and how your acupuncture provider treats it. The procedure may vary among health care providers. AFTER THE PROCEDURE  Keep all follow-up visits as directed by your health care provider. This is important.  Let your acupuncture provider know if you have:  Soreness.  Skin redness or irritation.  Fever.   This information is not intended to replace advice given to you by your health care provider. Make sure you discuss any questions you have with your health care provider.   Document Released: 02/19/2003 Document Revised: 07/03/2014 Document Reviewed: 02/07/2014 Elsevier Interactive Patient Education [employer]  Seizure, Adult A seizure is abnormal electrical activity in the brain. Seizures usually last from 30 seconds to 2 minutes. There are various types of seizures. Before a seizure, you may have a warning sensation (aura) that a seizure is about to occur. An aura may include the following symptoms:   Fear or anxiety.  Nausea.  Feeling like the room is spinning (vertigo).  Vision changes, such as seeing flashing lights or spots. Common symptoms during a seizure include:  A change in attention or behavior (altered mental status).  Convulsions with rhythmic jerking movements.  Drooling.  Rapid eye movements.  Grunting.  Loss of bladder and bowel control.  Bitter taste in the mouth.  Tongue biting. After a seizure, you may feel confused and sleepy. You may also have an injury resulting from convulsions during the seizure. HOME CARE INSTRUCTIONS   If you are given medicines, take them exactly as  prescribed by your health care provider.  Keep all follow-up appointments as directed by your health care  provider.  Do not swim or drive or engage in risky activity during which a seizure could cause further injury to you or others until your health care provider says it is OK.  Get adequate rest.  Teach friends and family what to do if you have a seizure. They should:  Lay you on the ground to prevent a fall.  Put a cushion under your head.  Loosen any tight clothing around your neck.  Turn you on your side. If vomiting occurs, this helps keep your airway clear.  Stay with you until you recover.  Know whether or not you need emergency care. SEEK IMMEDIATE MEDICAL CARE IF:  The seizure lasts longer than 5 minutes.  The seizure is severe or you do not wake up immediately after the seizure.  You have an altered mental status after the seizure.  You are having more frequent or worsening seizures. Someone should drive you to the emergency department or call local emergency services (911 in [place] MAKE SURE YOU:  Understand these instructions.  Will watch your condition.  Will get help right away if you are not doing well or get worse.   This information is not intended to replace advice given to you by your health care provider. Make sure you discuss any questions you have with your health care provider.   Document Released: 02/14/2000 Document Revised: 03/09/2014 Document Reviewed: 09/28/2012 Elsevier Interactive Patient Education [employer] Elsevier Inc[employer]

## 2014-12-19 NOTE — ED Notes (Signed)
MD at bedside. 

## 2014-12-19 NOTE — Consult Note (Signed)
CC: seizure activity  HPI: Veronica Brooks is an 19 y.o. female a mother of two presented with multiple seizures. Recently has another seizure and discharged without any medications.  Neurology consulted for recurrent seizures.   Pt states she has post ictal state and had urinary incontinence during one of the seizures.    Past Medical History  Diagnosis Date  . Medical history non-contributory   . Bronchitis   . Heart murmur     Does not require cardiologist    Past Surgical History  Procedure Laterality Date  . No past surgeries      Family History  Problem Relation Age of Onset  . Diabetes Maternal Grandmother   . Cancer Maternal Grandfather     Social History:  reports that she has been smoking Cigarettes.  She has never used smokeless tobacco. She reports that she does not drink alcohol or use illicit drugs.  Allergies  Allergen Reactions  . Latex Rash and Other (See Comments)    Reaction:  Burning     Medications: I have reviewed the patient's current medications.  ROS: History obtained from the patient  General ROS: negative for - chills, fatigue, fever, night sweats, weight gain or weight loss Psychological ROS: negative for - behavioral disorder, hallucinations, memory difficulties, mood swings or suicidal ideation Ophthalmic ROS: negative for - blurry vision, double vision, eye pain or loss of vision ENT ROS: negative for - epistaxis, nasal discharge, oral lesions, sore throat, tinnitus or vertigo Allergy and Immunology ROS: negative for - hives or itchy/watery eyes Hematological and Lymphatic ROS: negative for - bleeding problems, bruising or swollen lymph nodes Endocrine ROS: negative for - galactorrhea, hair pattern changes, polydipsia/polyuria or temperature intolerance Respiratory ROS: negative for - cough, hemoptysis, shortness of breath or wheezing Cardiovascular ROS: negative for - chest pain, dyspnea on exertion, edema or irregular  heartbeat Gastrointestinal ROS: negative for - abdominal pain, diarrhea, hematemesis, nausea/vomiting or stool incontinence Genito-Urinary ROS: negative for - dysuria, hematuria, incontinence or urinary frequency/urgency Musculoskeletal ROS: negative for - joint swelling or muscular weakness Neurological ROS: as noted in HPI Dermatological ROS: negative for rash and skin lesion changes  Physical Examination: Blood pressure 101/59, pulse 50, temperature 97.6 F (36.4 C), temperature source Oral, resp. rate 18, height 5' (1.524 m), weight 110 lb 11.2 oz (50.213 kg), last menstrual period 11/24/2014, SpO2 100 %, unknown if currently breastfeeding.   Neurological Examination Mental Status: Alert, oriented, thought content appropriate.  Speech fluent without evidence of aphasia.  Able to follow 3 step commands without difficulty. Cranial Nerves: II: Discs flat bilaterally; Visual fields grossly normal, pupils equal, round, reactive to light and accommodation III,IV, VI: ptosis not present, extra-ocular motions intact bilaterally V,VII: smile symmetric, facial light touch sensation normal bilaterally VIII: hearing normal bilaterally IX,X: gag reflex present XI: bilateral shoulder shrug XII: midline tongue extension Motor: Right : Upper extremity   5/5    Left:     Upper extremity   5/5  Lower extremity   5/5     Lower extremity   5/5 Tone and bulk:normal tone throughout; no atrophy noted Sensory: Pinprick and light touch intact throughout, bilaterally Deep Tendon Reflexes: 2+ and symmetric throughout Plantars: Right: downgoing   Left: downgoing Cerebellar: normal finger-to-nose, normal rapid alternating movements and normal heel-to-shin test Gait: normal gait and station      Laboratory Studies:   Basic Metabolic Panel:  Recent Labs Lab 12/14/14 1435 12/18/14 2318  NA 139 140  K 3.3* 3.6  CL 106 106  CO2 25 27  GLUCOSE 98 88  BUN 11 9  CREATININE 0.79 0.75  CALCIUM 9.3  9.5    Liver Function Tests:  Recent Labs Lab 12/14/14 1435 12/18/14 2318  AST 23 19  ALT 16 15  ALKPHOS 50 50  BILITOT 1.0 0.3  PROT 7.4 7.9  ALBUMIN 4.5 4.8   No results for input(s): LIPASE, AMYLASE in the last 168 hours. No results for input(s): AMMONIA in the last 168 hours.  CBC:  Recent Labs Lab 12/14/14 1435 12/18/14 2318  WBC 5.6 7.6  HGB 12.4 12.8  HCT 38.0 39.4  MCV 93.0 93.2  PLT 253 267    Cardiac Enzymes: No results for input(s): CKTOTAL, CKMB, CKMBINDEX, TROPONINI in the last 168 hours.  BNP: Invalid input(s): POCBNP  CBG:  Recent Labs Lab 12/14/14 1534  GLUCAP 99    Microbiology: Results for orders placed or performed during the hospital encounter of 12/14/14  Urine culture     Status: None   Collection Time: 12/14/14  3:30 PM  Result Value Ref Range Status   Specimen Description URINE, CATHETERIZED  Final   Special Requests Normal  Final   Culture >=100,000 COLONIES/mL ESCHERICHIA COLI  Final   Report Status 12/16/2014 FINAL  Final   Organism ID, Bacteria ESCHERICHIA COLI  Final      Susceptibility   Escherichia coli - MIC*    AMPICILLIN >=32 RESISTANT Resistant     CEFTAZIDIME <=1 SENSITIVE Sensitive     CEFAZOLIN <=4 SENSITIVE Sensitive     CEFTRIAXONE <=1 SENSITIVE Sensitive     CIPROFLOXACIN <=0.25 SENSITIVE Sensitive     GENTAMICIN <=1 SENSITIVE Sensitive     IMIPENEM <=0.25 SENSITIVE Sensitive     TRIMETH/SULFA <=20 SENSITIVE Sensitive     NITROFURANTOIN Value in next row Sensitive      SENSITIVE<=16    PIP/TAZO Value in next row Sensitive      SENSITIVE<=4    LEVOFLOXACIN Value in next row Sensitive      SENSITIVE<=0.12    * >=100,000 COLONIES/mL ESCHERICHIA COLI    Coagulation Studies: No results for input(s): LABPROT, INR in the last 72 hours.  Urinalysis:  Recent Labs Lab 12/14/14 1530  COLORURINE STRAW*  LABSPEC 1.006  PHURINE 7.0  GLUCOSEU NEGATIVE  HGBUR NEGATIVE  BILIRUBINUR NEGATIVE  KETONESUR  NEGATIVE  PROTEINUR NEGATIVE  NITRITE NEGATIVE  LEUKOCYTESUR NEGATIVE    Lipid Panel:  No results found for: CHOL, TRIG, HDL, CHOLHDL, VLDL, LDLCALC  HgbA1C:  Lab Results  Component Value Date   HGBA1C 5.4 12/18/2014    Urine Drug Screen:     Component Value Date/Time   LABOPIA NONE DETECTED 12/14/2014 1530   LABOPIA NONE DETECTED 03/23/2014 2250   LABOPIA NEG 02/14/2014 1214   COCAINSCRNUR NONE DETECTED 03/23/2014 2250   COCAINSCRNUR NEG 02/14/2014 1214   LABBENZ NONE DETECTED 12/14/2014 1530   LABBENZ NONE DETECTED 03/23/2014 2250   LABBENZ NEG 02/14/2014 1214   AMPHETMU NONE DETECTED 12/14/2014 1530   AMPHETMU NONE DETECTED 03/23/2014 2250   AMPHETMU NEG 02/14/2014 1214   THCU NONE DETECTED 12/14/2014 1530   THCU NONE DETECTED 03/23/2014 2250   THCU NEG 02/14/2014 1214   LABBARB NONE DETECTED 12/14/2014 1530   LABBARB NONE DETECTED 03/23/2014 2250   LABBARB NEG 02/14/2014 1214    Alcohol Level: No results for input(s): ETH in the last 168 hours.    Imaging: No results found.   Assessment/Plan:  19 y.o. female a mother  of two presented with multiple seizures. Recently has another seizure and discharged without any medications.  Neurology consulted for recurrent seizures.   Positive urinary incontinence and post ictal state after the seizures.    ? Depression component.    Pt needs work up for recurrent seizures which includes MRI and EEG EEG prelim no epileptiform activity MRI brain pending Loaded with 1g Keppra and started 500 q12.  Feel safest medication at this point in female that is child bearing age Out pt Neurology follow up No driving prior to out pt neurology follow up. No bath alone.  Can be d/c from neuro stand point after above work up complete  12/19/2014, 8:39 AM

## 2014-12-19 NOTE — H&P (Signed)
Veronica Brooks is an 19 y.o. female.   Chief Complaint: Seizure activity HPI: Patient presents emergency department following 2 witnessed episodes of seizure activity. 3 days ago the patient had a seizure and was evaluated in the emergency department. Prior to these events the patient has not had any seizures in the past. Duration of seizures is unknown but the patient admits to loss of continence. Currently she complains only of headache. She does not recall any aura or prodrome to the seizure and is only aware that she had one after being told. She states that her mother informed her she did not fall or hit her head. Due to recurrent seizure activity the emergency department staff called for admission.  Past Medical History  Diagnosis Date  . Medical history non-contributory   . Bronchitis   . Heart murmur     Does not require cardiologist    Past Surgical History  Procedure Laterality Date  . No past surgeries      Family History  Problem Relation Age of Onset  . Diabetes Maternal Grandmother   . Cancer Maternal Grandfather    Social History:  reports that she has quit smoking. Her smoking use included Cigars. She has never used smokeless tobacco. She reports that she does not drink alcohol or use illicit drugs.  Allergies:  Allergies  Allergen Reactions  . Latex Rash and Other (See Comments)    Reaction:  Burning     Prior to Admission medications   Medication Sig Start Date End Date Taking? Authorizing Provider  albuterol (PROVENTIL HFA;VENTOLIN HFA) 108 (90 BASE) MCG/ACT inhaler Inhale 2 puffs into the lungs every 6 (six) hours as needed for wheezing or shortness of breath.    Historical Provider, MD  cephALEXin (KEFLEX) 500 MG capsule Take 1 capsule (500 mg total) by mouth 4 (four) times daily. Patient not taking: Reported on 12/18/2014 10/26/14   Janne Napoleon, NP  lidocaine-hydrocortisone (ANAMANTEL HC) 3-0.5 % CREA Place 1 Applicatorful rectally 2 (two) times daily. Patient not  taking: Reported on 12/18/2014 10/26/14   Janne Napoleon, NP  metroNIDAZOLE (FLAGYL) 500 MG tablet Take 1 tablet (500 mg total) by mouth 2 (two) times daily. X 7 days Patient not taking: Reported on 12/18/2014 10/26/14   Janne Napoleon, NP     Results for orders placed or performed during the hospital encounter of 12/18/14 (from the past 48 hour(s))  CBC     Status: None   Collection Time: 12/18/14 11:18 PM  Result Value Ref Range   WBC 7.6 3.6 - 11.0 K/uL   RBC 4.23 3.80 - 5.20 MIL/uL   Hemoglobin 12.8 12.0 - 16.0 g/dL   HCT 39.4 35.0 - 47.0 %   MCV 93.2 80.0 - 100.0 fL   MCH 30.2 26.0 - 34.0 pg   MCHC 32.4 32.0 - 36.0 g/dL   RDW 13.2 11.5 - 14.5 %   Platelets 267 150 - 440 K/uL  Comprehensive metabolic panel     Status: None   Collection Time: 12/18/14 11:18 PM  Result Value Ref Range   Sodium 140 135 - 145 mmol/L   Potassium 3.6 3.5 - 5.1 mmol/L   Chloride 106 101 - 111 mmol/L   CO2 27 22 - 32 mmol/L   Glucose, Bld 88 65 - 99 mg/dL   BUN 9 6 - 20 mg/dL   Creatinine, Ser 0.75 0.44 - 1.00 mg/dL   Calcium 9.5 8.9 - 10.3 mg/dL   Total Protein 7.9 6.5 - 8.1 g/dL  Albumin 4.8 3.5 - 5.0 g/dL   AST 19 15 - 41 U/L   ALT 15 14 - 54 U/L   Alkaline Phosphatase 50 38 - 126 U/L   Total Bilirubin 0.3 0.3 - 1.2 mg/dL   GFR calc non Af Amer >60 >60 mL/min   GFR calc Af Amer >60 >60 mL/min    Comment: (NOTE) The eGFR has been calculated using the CKD EPI equation. This calculation has not been validated in all clinical situations. eGFR's persistently <60 mL/min signify possible Chronic Kidney Disease.    Anion gap 7 5 - 15  hCG, quantitative, pregnancy     Status: None   Collection Time: 12/18/14 11:18 PM  Result Value Ref Range   hCG, Beta Chain, Quant, S <1 <5 mIU/mL    Comment:          GEST. AGE      CONC.  (mIU/mL)   <=1 WEEK        5 - 50     2 WEEKS       50 - 500     3 WEEKS       100 - 10,000     4 WEEKS     1,000 - 30,000     5 WEEKS     3,500 - 115,000   6-8 WEEKS      12,000 - 270,000    12 WEEKS     15,000 - 220,000        FEMALE AND NON-PREGNANT FEMALE:     LESS THAN 5 mIU/mL    No results found.  Review of Systems  Constitutional: Negative for fever and chills.  HENT: Negative for sore throat and tinnitus.   Eyes: Negative for blurred vision and redness.  Respiratory: Negative for cough and shortness of breath.   Cardiovascular: Negative for chest pain, palpitations, orthopnea and PND.  Gastrointestinal: Positive for nausea (Intermittent). Negative for vomiting, abdominal pain and diarrhea.  Genitourinary: Negative for dysuria, urgency and frequency.  Musculoskeletal: Negative for myalgias and joint pain.  Skin: Negative for rash.       No lesions  Neurological: Positive for headaches. Negative for speech change, focal weakness and weakness.  Endo/Heme/Allergies: Does not bruise/bleed easily.       No temperature intolerance  Psychiatric/Behavioral: Negative for depression and suicidal ideas.    Blood pressure 134/75, pulse 63, temperature 98 F (36.7 C), temperature source Oral, resp. rate 20, height _0  (1.575 m), weight 51.256 kg (113 lb), last menstrual period 11/14/2014, SpO2 100 %, unknown if currently breastfeeding. Physical Exam  Vitals reviewed. Constitutional: She is oriented to person, place, and time. She appears well-developed and well-nourished.  HENT:  Head: Normocephalic and atraumatic.  Mouth/Throat: Mucous membranes are dry.  Eyes: Conjunctivae and EOM are normal. Pupils are equal, round, and reactive to light. No scleral icterus.  Neck: Normal range of motion. Neck supple. No JVD present. No tracheal deviation present. No thyromegaly present.  Cardiovascular: Normal rate, regular rhythm and normal heart sounds.  Exam reveals no gallop and no friction rub.   No murmur heard. Respiratory: Effort normal and breath sounds normal.  GI: Soft. Bowel sounds are normal. She exhibits no distension. There is no tenderness.   Genitourinary:  Deferred  Musculoskeletal: Normal range of motion. She exhibits no edema.  Lymphadenopathy:    She has no cervical adenopathy.  Neurological: She is alert and oriented to person, place, and time. She has normal reflexes. No cranial nerve  deficit. She exhibits normal muscle tone. Coordination normal.  Skin: Skin is warm and dry.  Psychiatric: She has a normal mood and affect. Judgment and thought content normal.     Assessment/Plan This is a 19 year old African American female admitted for new onset seizures. 1. Seizure activity: The patient has been loaded with Keppra in the emergency department. She does not have any neurologic deficits. Check reversible causes of seizure. Neurology consult ordered. 2. DVT prophylaxis: Lovenox 3. GI prophylaxis: None The patient is a full code. Time spent on admission orders and patient care approximately 35 minutes.  Harrie Foreman 12/19/2014, 1:49 AM

## 2014-12-19 NOTE — Care Management (Signed)
Admitted to [facility] with the diagnosis of seizures under observation status. Lives with mother, Veronica SpinnerShelsea Gaba [phone]). [facility] 12/19/14.  Del[facility] a baby [facility] [place] 03[place]. No falls. Takes care of all activities of daily living herself. Father of her 2 children helps with errands.  Neurology scheduled to evaluate [name] today. IV Keppra Gwenette GreetBrenda S Marchetta Navratil RN MSN Care Management [phone]

## 2014-12-19 NOTE — ED Provider Notes (Signed)
[facility] Emergency Department Provider Note  ____________________________________________  Time seen: 11:15 PM  I have reviewed the triage vital signs and the nursing notes.   HISTORY  Chief Complaint Headache and Seizures     HPI Veronica Brooks is a 19 y.o. female presents with history of 2 seizures today of unknown duration. Of note patient was seen on 1014 for a witnessed seizure for which she had a follow-up appointment scheduled for tomorrow with the neurologist. Patient denies any preceding symptoms however admits to being very tired with generalized weakness following the event. Per the patient's grandmother bedside patient "foamed from her mouth and was incontinent of urine. Patient denies any history of head trauma no family history of epilepsy. No antiepileptic medication was started after the patient's initial seizure on 12/14/2014    Past Medical History  Diagnosis Date  . Medical history non-contributory   . Bronchitis   . Heart murmur     Does not require cardiologist    Patient Active Problem List   Diagnosis Date Noted  . Pregnancy 03/23/2014  . UTI in pregnancy 03/21/2014  . Suspected macroscopic fetus 03/21/2014  . LGA (large for gestational age) fetus affecting management of mother 03/21/2014  . Fetal renal anomaly   . Fetal macrosomia   . [redacted] weeks gestation of pregnancy   . High risk teen pregnancy in third trimester 02/14/2014  . [redacted] weeks gestation of pregnancy   . Late prenatal care 01/17/2014    Past Surgical History  Procedure Laterality Date  . No past surgeries      Current Outpatient Rx  Name  Route  Sig  Dispense  Refill  . albuterol (PROVENTIL HFA;VENTOLIN HFA) 108 (90 BASE) MCG/ACT inhaler   Inhalation   Inhale 2 puffs into the lungs every 6 (six) hours as needed for wheezing or shortness of breath.         . cephALEXin (KEFLEX) 500 MG capsule   Oral   Take 1 capsule (500 mg total) by mouth 4 (four) times  daily. Patient not taking: Reported on 12/18/2014   28 capsule   0   . lidocaine-hydrocortisone (ANAMANTEL HC) 3-0.5 % CREA   Rectal   Place 1 Applicatorful rectally 2 (two) times daily. Patient not taking: Reported on 12/18/2014   7 g   0   . metroNIDAZOLE (FLAGYL) 500 MG tablet   Oral   Take 1 tablet (500 mg total) by mouth 2 (two) times daily. X 7 days Patient not taking: Reported on 12/18/2014   14 tablet   0     Allergies Latex  Family History  Problem Relation Age of Onset  . Diabetes Maternal Grandmother   . Cancer Maternal Grandfather     Social History Social History  Substance Use Topics  . Smoking status: Former Smoker    Types: Cigars  . Smokeless tobacco: Never Used  . Alcohol Use: No    Review of Systems  Constitutional: Negative for fever. Eyes: Negative for visual changes. ENT: Negative for sore throat. Cardiovascular: Negative for chest pain. Respiratory: Negative for shortness of breath. Gastrointestinal: Negative for abdominal pain, vomiting and diarrhea. Genitourinary: Negative for dysuria. Musculoskeletal: Negative for back pain. Skin: Negative for rash. Neurological: Positive for headaches and generalized weakness. Positive for seizure  10-point ROS otherwise negative.  ____________________________________________   PHYSICAL EXAM:  VITAL SIGNS: ED Triage Vitals  Enc Vitals Group     BP 12/18/14 2258 128/79 mmHg     Pulse Rate  12/18/14 2258 84     Resp 12/18/14 2258 18     Temp 12/18/14 2258 98 F (36.7 C)     Temp Source 12/18/14 2258 Oral     SpO2 12/18/14 2258 99 %     Weight 12/18/14 2258 113 lb (51.256 kg)     Height 12/18/14 2258 5\' 2"  (1.575 m)     Head Cir --      Peak Flow --      Pain Score 12/18/14 2257 10     Pain Loc --      Pain Edu? --      Excl. in GC? --     Constitutional: Alert and oriented. Well appearing and in no distress. Eyes: Conjunctivae are normal. PERRL. Normal extraocular  movements. ENT   Head: Normocephalic and atraumatic.   Nose: No congestion/rhinnorhea.   Mouth/Throat: Mucous membranes are moist.   Neck: No stridor. Hematological/Lymphatic/Immunilogical: No cervical lymphadenopathy. Cardiovascular: Normal rate, regular rhythm. Normal and symmetric distal pulses are present in all extremities. No murmurs, rubs, or gallops. Respiratory: Normal respiratory effort without tachypnea nor retractions. Breath sounds are clear and equal bilaterally. No wheezes/rales/rhonchi. Gastrointestinal: Soft and nontender. No distention. There is no CVA tenderness. Genitourinary: deferred Musculoskeletal: Nontender with normal range of motion in all extremities. No joint effusions.  No lower extremity tenderness nor edema. Neurologic:  Normal speech and language. No gross focal neurologic deficits are appreciated. Speech is normal.  Skin:  Skin is warm, dry and intact. No rash noted. Psychiatric: Mood and affect are normal. Speech and behavior are normal. Patient exhibits appropriate insight and judgment.  ____________________________________________    LABS (pertinent positives/negatives)  Labs Reviewed  CBC  COMPREHENSIVE METABOLIC PANEL  HCG, QUANTITATIVE, PREGNANCY  URINALYSIS COMPLETEWITH MICROSCOPIC ([facility] ONLY)         INITIAL IMPRESSION / ASSESSMENT AND PLAN / ED COURSE  Pertinent labs & imaging results that were available during my care of the patient were reviewed by me and considered in my medical decision making (see chart for details).  She discussed with Dr. Loretha BrasilZeylikman neurologist on call who agreed with plan for IV Keppra 1 g and hospital admission or further evaluation noting MRI and EEG.  ____________________________________________   FINAL CLINICAL IMPRESSION(S) / ED DIAGNOSES  Final diagnoses:  Seizure [facility](HCC)  Headache      Darci Currentandolph N Markes Shatswell, MD 12/19/14 [phone]

## 2014-12-20 DIAGNOSIS — R569 Unspecified convulsions: Secondary | ICD-10-CM

## 2014-12-20 MED ORDER — DEXAMETHASONE SODIUM PHOSPHATE 4 MG/ML IJ SOLN: 10.0000 mg | Freq: Once | INTRAMUSCULAR | Status: DC

## 2014-12-20 MED ORDER — MAGNESIUM SULFATE 2 GM/50ML IV SOLN
2.0000 g | Freq: Once | INTRAVENOUS | Status: DC
  Filled 2014-12-20: qty 50

## 2014-12-20 MED ORDER — LEVETIRACETAM 500 MG PO TABS: 500.0000 mg | ORAL_TABLET | Freq: Two times a day (BID) | ORAL | Status: DC

## 2014-12-20 MED ORDER — MAGNESIUM OXIDE 400 (241.3 MG) MG PO TABS: 400.0000 mg | ORAL_TABLET | ORAL | Status: DC | PRN

## 2014-12-20 MED ORDER — DEXAMETHASONE 4 MG PO TABS
10.0000 mg | ORAL_TABLET | Freq: Once | ORAL | Status: AC
  Administered 2014-12-20: 10 mg via ORAL
  Filled 2014-12-20: qty 3

## 2014-12-20 NOTE — Consult Note (Signed)
CC: seizure activity  HPI: Veronica Brooks is an 19 y.o. female a mother of two presented with multiple seizures. Recently has another seizure and discharged without any medications.  Neurology consulted for recurrent seizures.   Pt states she has post ictal state and had urinary incontinence during one of the seizures.    S/p MRI/MRV  Past Medical History  Diagnosis Date  . Medical history non-contributory   . Bronchitis   . Heart murmur     Does not require cardiologist    Past Surgical History  Procedure Laterality Date  . No past surgeries      Family History  Problem Relation Age of Onset  . Diabetes Maternal Grandmother   . Cancer Maternal Grandfather     Social History:  reports that she has been smoking Cigarettes.  She has never used smokeless tobacco. She reports that she does not drink alcohol or use illicit drugs.  Allergies  Allergen Reactions  . Latex Rash and Other (See Comments)    Reaction:  Burning     Medications: I have reviewed the patient's current medications.  ROS: History obtained from the patient  General ROS: negative for - chills, fatigue, fever, night sweats, weight gain or weight loss Psychological ROS: negative for - behavioral disorder, hallucinations, memory difficulties, mood swings or suicidal ideation Ophthalmic ROS: negative for - blurry vision, double vision, eye pain or loss of vision ENT ROS: negative for - epistaxis, nasal discharge, oral lesions, sore throat, tinnitus or vertigo Allergy and Immunology ROS: negative for - hives or itchy/watery eyes Hematological and Lymphatic ROS: negative for - bleeding problems, bruising or swollen lymph nodes Endocrine ROS: negative for - galactorrhea, hair pattern changes, polydipsia/polyuria or temperature intolerance Respiratory ROS: negative for - cough, hemoptysis, shortness of breath or wheezing Cardiovascular ROS: negative for - chest pain, dyspnea on exertion, edema or irregular  heartbeat Gastrointestinal ROS: negative for - abdominal pain, diarrhea, hematemesis, nausea/vomiting or stool incontinence Genito-Urinary ROS: negative for - dysuria, hematuria, incontinence or urinary frequency/urgency Musculoskeletal ROS: negative for - joint swelling or muscular weakness Neurological ROS: as noted in HPI Dermatological ROS: negative for rash and skin lesion changes  Physical Examination: Blood pressure 113/67, pulse 65, temperature 98.1 F (36.7 C), temperature source Oral, resp. rate 18, height 5' (1.524 m), weight 116 lb 3.2 oz (52.708 kg), last menstrual period 11/24/2014, SpO2 99 %, unknown if currently breastfeeding.   Neurological Examination Mental Status: Alert, oriented, thought content appropriate.  Speech fluent without evidence of aphasia.  Able to follow 3 step commands without difficulty. Cranial Nerves: II: Discs flat bilaterally; Visual fields grossly normal, pupils equal, round, reactive to light and accommodation III,IV, VI: ptosis not present, extra-ocular motions intact bilaterally V,VII: smile symmetric, facial light touch sensation normal bilaterally VIII: hearing normal bilaterally IX,X: gag reflex present XI: bilateral shoulder shrug XII: midline tongue extension Motor: Right : Upper extremity   5/5    Left:     Upper extremity   5/5  Lower extremity   5/5     Lower extremity   5/5 Tone and bulk:normal tone throughout; no atrophy noted Sensory: Pinprick and light touch intact throughout, bilaterally Deep Tendon Reflexes: 2+ and symmetric throughout Plantars: Right: downgoing   Left: downgoing Cerebellar: normal finger-to-nose, normal rapid alternating movements and normal heel-to-shin test Gait: normal gait and station      Laboratory Studies:   Basic Metabolic Panel:  Recent Labs Lab 12/14/14 1435 12/18/14 2318  NA 139 140  K 3.3* 3.6  CL 106 106  CO2 25 27  GLUCOSE 98 88  BUN 11 9  CREATININE 0.79 0.75  CALCIUM 9.3 9.5     Liver Function Tests:  Recent Labs Lab 12/14/14 1435 12/18/14 2318  AST 23 19  ALT 16 15  ALKPHOS 50 50  BILITOT 1.0 0.3  PROT 7.4 7.9  ALBUMIN 4.5 4.8   No results for input(s): LIPASE, AMYLASE in the last 168 hours. No results for input(s): AMMONIA in the last 168 hours.  CBC:  Recent Labs Lab 12/14/14 1435 12/18/14 2318  WBC 5.6 7.6  HGB 12.4 12.8  HCT 38.0 39.4  MCV 93.0 93.2  PLT 253 267    Cardiac Enzymes: No results for input(s): CKTOTAL, CKMB, CKMBINDEX, TROPONINI in the last 168 hours.  BNP: Invalid input(s): POCBNP  CBG:  Recent Labs Lab 12/14/14 1534  GLUCAP 99    Microbiology: Results for orders placed or performed during the hospital encounter of 12/14/14  Urine culture     Status: None   Collection Time: 12/14/14  3:30 PM  Result Value Ref Range Status   Specimen Description URINE, CATHETERIZED  Final   Special Requests Normal  Final   Culture >=100,000 COLONIES/mL ESCHERICHIA COLI  Final   Report Status 12/16/2014 FINAL  Final   Organism ID, Bacteria ESCHERICHIA COLI  Final      Susceptibility   Escherichia coli - MIC*    AMPICILLIN >=32 RESISTANT Resistant     CEFTAZIDIME <=1 SENSITIVE Sensitive     CEFAZOLIN <=4 SENSITIVE Sensitive     CEFTRIAXONE <=1 SENSITIVE Sensitive     CIPROFLOXACIN <=0.25 SENSITIVE Sensitive     GENTAMICIN <=1 SENSITIVE Sensitive     IMIPENEM <=0.25 SENSITIVE Sensitive     TRIMETH/SULFA <=20 SENSITIVE Sensitive     NITROFURANTOIN Value in next row Sensitive      SENSITIVE<=16    PIP/TAZO Value in next row Sensitive      SENSITIVE<=4    LEVOFLOXACIN Value in next row Sensitive      SENSITIVE<=0.12    * >=100,000 COLONIES/mL ESCHERICHIA COLI    Coagulation Studies: No results for input(s): LABPROT, INR in the last 72 hours.  Urinalysis:   Recent Labs Lab 12/14/14 1530 12/19/14 1132  COLORURINE STRAW* YELLOW*  LABSPEC 1.006 1.029  PHURINE 7.0 5.0  GLUCOSEU NEGATIVE NEGATIVE  HGBUR NEGATIVE  NEGATIVE  BILIRUBINUR NEGATIVE NEGATIVE  KETONESUR NEGATIVE NEGATIVE  PROTEINUR NEGATIVE NEGATIVE  NITRITE NEGATIVE NEGATIVE  LEUKOCYTESUR NEGATIVE 2+*    Lipid Panel:  No results found for: CHOL, TRIG, HDL, CHOLHDL, VLDL, LDLCALC  HgbA1C:  Lab Results  Component Value Date   HGBA1C 5.4 12/18/2014    Urine Drug Screen:      Component Value Date/Time   LABOPIA NONE DETECTED 12/19/2014 1132   LABOPIA NONE DETECTED 03/23/2014 2250   LABOPIA NEG 02/14/2014 1214   COCAINSCRNUR NONE DETECTED 03/23/2014 2250   COCAINSCRNUR NEG 02/14/2014 1214   LABBENZ NONE DETECTED 12/19/2014 1132   LABBENZ NONE DETECTED 03/23/2014 2250   LABBENZ NEG 02/14/2014 1214   AMPHETMU NONE DETECTED 12/19/2014 1132   AMPHETMU NONE DETECTED 03/23/2014 2250   AMPHETMU NEG 02/14/2014 1214   THCU NONE DETECTED 12/19/2014 1132   THCU NONE DETECTED 03/23/2014 2250   THCU NEG 02/14/2014 1214   LABBARB NONE DETECTED 12/19/2014 1132   LABBARB NONE DETECTED 03/23/2014 2250   LABBARB NEG 02/14/2014 1214    Alcohol Level: No results for input(s): ETH in the last  168 hours.    Imaging: Mr Laqueta JeanBrain W ZOWo Contrast  12/19/2014  CLINICAL DATA:  Seizure EXAM: MRI HEAD WITHOUT AND WITH CONTRAST TECHNIQUE: Multiplanar, multiecho pulse sequences of the brain and surrounding structures were obtained without and with intravenous contrast. CONTRAST:  10mL MULTIHANCE GADOBENATE DIMEGLUMINE 529 MG/ML IV SOLN COMPARISON:  None. FINDINGS: Image quality degraded by mild motion. Ventricle size normal. Cerebral volume normal. Pituitary normal in size. Negative for Chiari malformation. Negative for acute or chronic infarction Negative for demyelinating disease. Cerebral white matter normal. Brainstem and cerebellum normal Negative for hemorrhage or fluid collection. Negative for mass or edema Medial temporal lobe normal in signal and volume. Negative for mesial temporal sclerosis. Postcontrast imaging reveals normal enhancement. Mild  mucosal edema in the paranasal sinuses. IMPRESSION: Normal MRI of the brain with contrast. Image quality degraded by mild motion. Mild mucosal edema paranasal sinuses. Electronically Signed   By: Marlan Palauharles  Clark M.D.   On: 12/19/2014 10:42   Mr Susie CassetteVenogram Head  12/19/2014  CLINICAL DATA:  Headache.  Seizures. EXAM: MR VENOGRAM the HEAD WITHOUT CONTRAST TECHNIQUE: Angiographic images of the intracranial venous structures were obtained using MRV technique without intravenous contrast. COMPARISON:  None. FINDINGS: The dural sinuses are patent. The transverse sinuses codominant. The straight sinus and deep cerebral veins are patent. IMPRESSION: Normal MR venogram. FLUOROSCOPY TIME:  Radiation Exposure Index (as provided by the fluoroscopic device): MRI brain from the same day Electronically Signed   By: Marin Robertshristopher  Mattern M.D.   On: 12/19/2014 19:28     Assessment/Plan:  19 y.o. female a mother of two presented with multiple seizures. Recently has another seizure and discharged without any medications.  Neurology consulted for recurrent seizures.   Positive urinary incontinence and post ictal state after the seizures.      No further seizures overnight or yesterday.  MRI/MRV no acute abnormalities.   Complaining of pressure like HA this AM 8/10-  Decadron and Mg ordered prior to d/c Out pt over the counter Mg and Vitamin B complex Daily Keppra 500 q 12 No driving, no swimming alone Pt lives locally follow up with Dr. Shah/Dr. Malvin JohnsPotter as out pt.   D/c today  No driving prior to out pt neurology follow up. No bath alone.  Can be d/c from neuro stand point after above work up complete  12/20/2014, 9:20 AM

## 2014-12-20 NOTE — Progress Notes (Signed)
MD making rounds. Discharge orders received. Appointments scheduled. IV removed. Prescriptions given to patient. Discharge paperwork provided, explained, signed and witnessed. Education handouts provided to patient. No unanswered questions. Escorted via wheelchair by Scientist, research (physical sciences)auxiliary staff. All belongings sent with patient and family.

## 2014-12-20 NOTE — Plan of Care (Signed)
Problem: Discharge Progression Outcomes Goal: Other Discharge Outcomes/Goals Outcome: Progressing Plan of care progress to goal for: 1. Pain-patient without complaints of pain this shift. 2. Hemodynamically- VSS, afebrile BP 122/68 mmHg  Pulse 68  Temp(Src) 98.2 F (36.8 C) (Oral)  Resp 18  Ht 5' (1.524 m)  Wt 110 lb 11.2 oz (50.213 kg)  BMI 21.62 kg/m2  SpO2 100%  LMP 11/24/2014 3. Complications-no evidence of complications this shift 4. Diet-pt tolerating diet  5. Activity-pt in to bathroom assisted  Received at handoff that patient was waiting for results of MRV.  Once results were in, nurse was to page Dr. Seth BakeV who would be waiting and then patient could be discharged.  MRV results.  Dr. Seth BakeV paged 4 x with no response.  Nurse paged Dr. Herma CarsonZ the neurologist who was fine with patient discharge.  Nurse paged hospitalist and received orders for discharge.  When nurse discussed discharge with patient she was informed that patient was without a ride home and would have to spend the night.  Charge nurse confirmed with nursing supervisor that we are not allowed to call taxi for patient and that she would have to spend another night if she did not have a ride home.  Discharge paperwork is prepared and ready for patient's ride to arrive for discharge.

## 2014-12-20 NOTE — Discharge Summary (Signed)
[facility] - [place] at [facility]   PATIENT NAME: Veronica Brooks    MR#:  [id number]  DATE OF BIRTH:  1996-01-19  DATE OF ADMISSION:  12/18/2014 ADMITTING PHYSICIAN: Arnaldo Natal, MD  DATE OF DISCHARGE: 12/20/2014 12:34 PM  PRIMARY CARE PHYSICIAN: [facility]     ADMISSION DIAGNOSIS:  Seizure (HCC) [R56.9] Headache [R51]  DISCHARGE DIAGNOSIS:  Principal Problem:   Seizure (HCC) Active Problems:   Postictal headache   Hypotension   Hypokalemia   UTI (urinary tract infection)   Headache   SECONDARY DIAGNOSIS:   Past Medical History  Diagnosis Date  . Medical history non-contributory   . Bronchitis   . Heart murmur     Does not require cardiologist    .pro HOSPITAL COURSE:  Patient is a 19 year old female who presented to the hospital with recurrent seizures. On arrival to the hospital she was afebrile. However, intermittently hypotensive with systolic blood pressure in 90s. Patient admitted of losing control of her bladder and was postictal. Head CT scan without contrast was unremarkable. Patient was loaded with 1 g of Keppra and admitted to the hospital. Patient was seen by neurologist and underwent MRI of the brain which was unremarkable due to headaches. She also had MRV of the brain which was also normal. Discussion by problem 1. Recurrent seizure. The patient underwent seizure workup with MRI of brain, electroencephalogram, all of which were unremarkable , neurology consultation was obtained. Patient was advised to continue  Keppra at 500 mg twice daily dose orally, patient was consulted about seizure precautions, driving, she was recommended to follow-up with [facility] neurology as outpatient.  2. Hypokalemia. Supplemented intravenously, resolved 3. Hypotension. Resolved on IV fluids 4. Urinary tract infection, given 1 dose of fosfomycin 5. Headache.  MRV was negative, patient was recommended dexamethasone and magnesium by  neurologist before discharge. Unfortunately, patient had her IV line discontinued, so we will be giving her dexamethasone orally and magnesium will be ordered for her upon discharge as outpatient. Patient is to follow-up with neurologist for further recommendations in regards to if any ongoing headaches  DISCHARGE CONDITIONS:   Stable  CONSULTS OBTAINED:  Treatment Team:  Pauletta Browns, MD  DRUG ALLERGIES:   Allergies  Allergen Reactions  . Latex Rash and Other (See Comments)    Reaction:  Burning     DISCHARGE MEDICATIONS:   Discharge Medication List as of 12/20/2014 12:10 PM    START taking these medications   Details  magnesium oxide (MAG-OX) 400 (241.3 MG) MG tablet Take 1 tablet (400 mg total) by mouth every 4 (four) hours as needed (Mag < 1.8)., Starting 12/20/2014, Until Discontinued, Print      CONTINUE these medications which have CHANGED   Details  levETIRAcetam (KEPPRA) 500 MG tablet Take 1 tablet (500 mg total) by mouth 2 (two) times daily., Starting 12/20/2014, Until Discontinued, Print      CONTINUE these medications which have NOT CHANGED   Details  albuterol (PROVENTIL HFA;VENTOLIN HFA) 108 (90 BASE) MCG/ACT inhaler Inhale 2 puffs into the lungs every 6 (six) hours as needed for wheezing or shortness of breath., Until Discontinued, Historical Med    cephALEXin (KEFLEX) 500 MG capsule Take 1 capsule (500 mg total) by mouth 4 (four) times daily., Starting 10/26/2014, Until Discontinued, Print    lidocaine-hydrocortisone (ANAMANTEL HC) 3-0.5 % CREA Place 1 Applicatorful rectally 2 (two) times daily., Starting 10/26/2014, Until Discontinued, Print    metroNIDAZOLE (FLAGYL) 500 MG tablet Take 1  tablet (500 mg total) by mouth 2 (two) times daily. X 7 days, Starting 10/26/2014, Until Discontinued, Print         DISCHARGE INSTRUCTIONS:    Patient is to follow-up with [facility] neurology as primary care physician at [facility] clinic  If you experience worsening of  your admission symptoms, develop shortness of breath, life threatening emergency, suicidal or homicidal thoughts you must seek medical attention immediately by calling 911 or calling your MD immediately  if symptoms less severe.  You Must read complete instructions/literature along with all the possible adverse reactions/side effects for all the Medicines you take and that have been prescribed to you. Take any new Medicines after you have completely understood and accept all the possible adverse reactions/side effects.   Please note  You were cared for by a hospitalist during your hospital stay. If you have any questions about your discharge medications or the care you received while you were in the hospital after you are discharged, you can call the unit and asked to speak with the hospitalist on call if the hospitalist that took care of you is not available. Once you are discharged, your primary care physician will handle any further medical issues. Please note that NO REFILLS for any discharge medications will be authorized once you are discharged, as it is imperative that you return to your primary care physician (or establish a relationship with a primary care physician if you do not have one) for your aftercare needs so that they can reassess your need for medications and monitor your lab values.    Today   CHIEF COMPLAINT:   Chief Complaint  Patient presents with  . Headache  . Seizures    HISTORY OF PRESENT ILLNESS:  Veronica Brooks  is a 19 y.o. female  who presented to the hospital with recurrent seizures. On arrival to the hospital she was afebrile. However, intermittently hypotensive with systolic blood pressure in 90s. Patient admitted of losing control of her bladder and was postictal. Head CT scan without contrast was unremarkable. Patient was loaded with 1 g of Keppra and admitted to the hospital. Patient was seen by neurologist and underwent MRI of the brain which was unremarkable  due to headaches. She also had MRV of the brain which was also normal. Discussion by problem 1. Recurrent seizure. The patient underwent seizure workup with MRI of brain, electroencephalogram, all of which were unremarkable , neurology consultation was obtained. Patient was advised to continue  Keppra at 500 mg twice daily dose orally, patient was consulted about seizure precautions, driving, she was recommended to follow-up with [facility] neurology as outpatient.  2. Hypokalemia. Supplemented intravenously, resolved 3. Hypotension. Resolved on IV fluids 4. Urinary tract infection, given 1 dose of fosfomycin 5. Headache.  MRV was negative, patient was recommended dexamethasone and magnesium by neurologist before discharge. Unfortunately, patient had her IV line discontinued, so we will be giving her dexamethasone orally and magnesium will be ordered for her upon discharge as outpatient. Patient is to follow-up with neurologist for further recommendations in regards to if any ongoing headaches   VITAL SIGNS:  Blood pressure 116/65, pulse 63, temperature 98.1 F (36.7 C), temperature source Oral, resp. rate 18, height 5' (1.524 m), weight 52.708 kg (116 lb 3.2 oz), last menstrual period 11/24/2014, SpO2 100 %, unknown if currently breastfeeding.  I/O:   Intake/Output Summary (Last 24 hours) at 12/20/14 1331 Last data filed at 12/20/14 0944  Gross per 24 hour  Intake  360 ml  Output      0 ml  Net    360 ml    PHYSICAL EXAMINATION:  GENERAL:  19 y.o.-year-old patient lying in the bed with no acute distress.  EYES: Pupils equal, round, reactive to light and accommodation. No scleral icterus. Extraocular muscles intact.  HEENT: Head atraumatic, normocephalic. Oropharynx and nasopharynx clear.  NECK:  Supple, no jugular venous distention. No thyroid enlargement, no tenderness.  LUNGS: Normal breath sounds bilaterally, no wheezing, rales,rhonchi or crepitation. No use of accessory muscles of  respiration.  CARDIOVASCULAR: S1, S2 normal. No murmurs, rubs, or gallops.  ABDOMEN: Soft, non-tender, non-distended. Bowel sounds present. No organomegaly or mass.  EXTREMITIES: No pedal edema, cyanosis, or clubbing.  NEUROLOGIC: Cranial nerves II through XII are intact. Muscle strength 5/5 in all extremities. Sensation intact. Gait not checked.  PSYCHIATRIC: The patient is alert and oriented x 3.  SKIN: No obvious rash, lesion, or ulcer.   DATA REVIEW:   CBC  Recent Labs Lab 12/18/14 2318  WBC 7.6  HGB 12.8  HCT 39.4  PLT 267    Chemistries   Recent Labs Lab 12/18/14 2318  NA 140  K 3.6  CL 106  CO2 27  GLUCOSE 88  BUN 9  CREATININE 0.75  CALCIUM 9.5  AST 19  ALT 15  ALKPHOS 50  BILITOT 0.3    Cardiac Enzymes No results for input(s): TROPONINI in the last 168 hours.  Microbiology Results  Results for orders placed or performed during the hospital encounter of 12/14/14  Urine culture     Status: None   Collection Time: 12/14/14  3:30 PM  Result Value Ref Range Status   Specimen Description URINE, CATHETERIZED  Final   Special Requests Normal  Final   Culture >=100,000 COLONIES/mL ESCHERICHIA COLI  Final   Report Status 12/16/2014 FINAL  Final   Organism ID, Bacteria ESCHERICHIA COLI  Final      Susceptibility   Escherichia coli - MIC*    AMPICILLIN >=32 RESISTANT Resistant     CEFTAZIDIME <=1 SENSITIVE Sensitive     CEFAZOLIN <=4 SENSITIVE Sensitive     CEFTRIAXONE <=1 SENSITIVE Sensitive     CIPROFLOXACIN <=0.25 SENSITIVE Sensitive     GENTAMICIN <=1 SENSITIVE Sensitive     IMIPENEM <=0.25 SENSITIVE Sensitive     TRIMETH/SULFA <=20 SENSITIVE Sensitive     NITROFURANTOIN Value in next row Sensitive      SENSITIVE<=16    PIP/TAZO Value in next row Sensitive      SENSITIVE<=4    LEVOFLOXACIN Value in next row Sensitive      SENSITIVE<=0.12    * >=100,000 COLONIES/mL ESCHERICHIA COLI    RADIOLOGY:  Mr Laqueta Jean Wo Contrast  12/19/2014  CLINICAL  DATA:  Seizure EXAM: MRI HEAD WITHOUT AND WITH CONTRAST TECHNIQUE: Multiplanar, multiecho pulse sequences of the brain and surrounding structures were obtained without and with intravenous contrast. CONTRAST:  10mL MULTIHANCE GADOBENATE DIMEGLUMINE 529 MG/ML IV SOLN COMPARISON:  None. FINDINGS: Image quality degraded by mild motion. Ventricle size normal. Cerebral volume normal. Pituitary normal in size. Negative for Chiari malformation. Negative for acute or chronic infarction Negative for demyelinating disease. Cerebral white matter normal. Brainstem and cerebellum normal Negative for hemorrhage or fluid collection. Negative for mass or edema Medial temporal lobe normal in signal and volume. Negative for mesial temporal sclerosis. Postcontrast imaging reveals normal enhancement. Mild mucosal edema in the paranasal sinuses. IMPRESSION: Normal MRI of the brain with contrast. Image quality degraded  by mild motion. Mild mucosal edema paranasal sinuses. Electronically Signed   By: Marlan Palauharles  Clark M.D.   On: 12/19/2014 10:42   Mr Susie CassetteVenogram Head  12/19/2014  CLINICAL DATA:  Headache.  Seizures. EXAM: MR VENOGRAM the HEAD WITHOUT CONTRAST TECHNIQUE: Angiographic images of the intracranial venous structures were obtained using MRV technique without intravenous contrast. COMPARISON:  None. FINDINGS: The dural sinuses are patent. The transverse sinuses codominant. The straight sinus and deep cerebral veins are patent. IMPRESSION: Normal MR venogram. FLUOROSCOPY TIME:  Radiation Exposure Index (as provided by the fluoroscopic device): MRI brain from the same day Electronically Signed   By: Marin Robertshristopher  Mattern M.D.   On: 12/19/2014 19:28    EKG:   Orders placed or performed during the hospital encounter of 12/14/14  . ED EKG  . ED EKG      Management plans discussed with the patient, family and they are in agreement.  CODE STATUS:     Code Status Orders        Start     Ordered   12/19/14 0220  Full  code   Continuous     12/19/14 0219      TOTAL TIME TAKING CARE OF THIS PATIENT: 40 minutes.    Katharina CaperVAICKUTE,Isham Smitherman M.D on 12/20/2014 at 1:31 PM  Between 7am to 6pm - Pager - [phone]  After 6pm go to [email or web address] - password [email or web address] [facility]  [facility]  Office  [phone]  CC: Primary care physician; [facility]

## 2014-12-20 NOTE — Progress Notes (Signed)
[facility] - [place] at [facility]   PATIENT NAME: Veronica Brooks    MR#:  [id number]  DATE OF BIRTH:  07-30-1995  SUBJECTIVE:  CHIEF COMPLAINT:   Chief Complaint  Patient presents with  . Headache  . Seizures   Patient is a 19 year old female who presented to the hospital with recurrent seizures. On arrival to the hospital she was afebrile. However, intermittently hypotensive with systolic blood pressure in 90s. Patient admitted of losing control of her bladder and was postictal. Head CT scan without contrast was unremarkable. Patient was loaded with 1 g of Keppra and admitted to the hospital. She remains somnolent at present and not able to provide review of systems.    Review of Systems  Unable to perform ROS: mental acuity   patient is somnolent  VITAL SIGNS: Blood pressure 113/67, pulse 65, temperature 98.1 F (36.7 C), temperature source Oral, resp. rate 18, height 5' (1.524 m), weight 52.708 kg (116 lb 3.2 oz), last menstrual period 11/24/2014, SpO2 99 %, unknown if currently breastfeeding.  PHYSICAL EXAMINATION:   GENERAL:  19 y.o.-year-old patient lying in the bed with no acute distress. Somnolent, difficult to arouse, however, able to converse whenever more awake, opens her eyes and follows commands EYES: Pupils equal, round, reactive to light and accommodation. No scleral icterus. Extraocular muscles intact.  HEENT: Head atraumatic, normocephalic. Oropharynx and nasopharynx clear.  NECK:  Supple, no jugular venous distention. No thyroid enlargement, no tenderness.  LUNGS: Normal breath sounds bilaterally, no wheezing, rales,rhonchi or crepitation. No use of accessory muscles of respiration.  CARDIOVASCULAR: S1, S2 normal. No murmurs, rubs, or gallops.  ABDOMEN: Soft, nontender, nondistended. Bowel sounds present. No organomegaly or mass.  EXTREMITIES: No pedal edema, cyanosis, or clubbing.  NEUROLOGIC: Cranial nerves II through XII are intact.  Muscle strength 5/5 in all extremities. Sensation intact. Gait not checked.  PSYCHIATRIC: The patient is somnolent and oriented x 3.  SKIN: No obvious rash, lesion, or ulcer.   ORDERS/RESULTS REVIEWED:   CBC  Recent Labs Lab 12/14/14 1435 12/18/14 2318  WBC 5.6 7.6  HGB 12.4 12.8  HCT 38.0 39.4  PLT 253 267  MCV 93.0 93.2  MCH 30.4 30.2  MCHC 32.7 32.4  RDW 13.1 13.2   ------------------------------------------------------------------------------------------------------------------  Chemistries   Recent Labs Lab 12/14/14 1435 12/18/14 2318  NA 139 140  K 3.3* 3.6  CL 106 106  CO2 25 27  GLUCOSE 98 88  BUN 11 9  CREATININE 0.79 0.75  CALCIUM 9.3 9.5  AST 23 19  ALT 16 15  ALKPHOS 50 50  BILITOT 1.0 0.3   ------------------------------------------------------------------------------------------------------------------ estimated creatinine clearance is 81.2 mL/min (by C-G formula based on Cr of 0.75). ------------------------------------------------------------------------------------------------------------------  Recent Labs  12/18/14 2318  TSH 0.463    Cardiac Enzymes No results for input(s): CKMB, TROPONINI, MYOGLOBIN in the last 168 hours.  Invalid input(s): CK ------------------------------------------------------------------------------------------------------------------ Invalid input(s): POCBNP ---------------------------------------------------------------------------------------------------------------  RADIOLOGY: Mr Laqueta Jean Wo Contrast  12/19/2014  CLINICAL DATA:  Seizure EXAM: MRI HEAD WITHOUT AND WITH CONTRAST TECHNIQUE: Multiplanar, multiecho pulse sequences of the brain and surrounding structures were obtained without and with intravenous contrast. CONTRAST:  10mL MULTIHANCE GADOBENATE DIMEGLUMINE 529 MG/ML IV SOLN COMPARISON:  None. FINDINGS: Image quality degraded by mild motion. Ventricle size normal. Cerebral volume normal. Pituitary  normal in size. Negative for Chiari malformation. Negative for acute or chronic infarction Negative for demyelinating disease. Cerebral white matter normal. Brainstem and cerebellum normal Negative for hemorrhage or fluid collection.  Negative for mass or edema Medial temporal lobe normal in signal and volume. Negative for mesial temporal sclerosis. Postcontrast imaging reveals normal enhancement. Mild mucosal edema in the paranasal sinuses. IMPRESSION: Normal MRI of the brain with contrast. Image quality degraded by mild motion. Mild mucosal edema paranasal sinuses. Electronically Signed   By: Marlan Palauharles  Clark M.D.   On: 12/19/2014 10:42   Mr Susie CassetteVenogram Head  12/19/2014  CLINICAL DATA:  Headache.  Seizures. EXAM: MR VENOGRAM the HEAD WITHOUT CONTRAST TECHNIQUE: Angiographic images of the intracranial venous structures were obtained using MRV technique without intravenous contrast. COMPARISON:  None. FINDINGS: The dural sinuses are patent. The transverse sinuses codominant. The straight sinus and deep cerebral veins are patent. IMPRESSION: Normal MR venogram. FLUOROSCOPY TIME:  Radiation Exposure Index (as provided by the fluoroscopic device): MRI brain from the same day Electronically Signed   By: Marin Robertshristopher  Mattern M.D.   On: 12/19/2014 19:28    EKG:  Orders placed or performed during the hospital encounter of 12/14/14  . ED EKG  . ED EKG    ASSESSMENT AND PLAN:  Principal Problem:   Seizure (HCC) Active Problems:   Postictal headache   Hypotension   Hypokalemia   UTI (urinary tract infection)   Headache 1. Recurrent seizure. Get seizure workup with MRI of brain, electroencephalogram and neurology consultation, continue patient on Keppra orally, seizure precautions, neuro checks 2. Hypokalemia. Supplement intravenously 3. Hypotension. Continue patient on IV fluids, follow clinically 4. Urinary tract infection, give patient 1 dose of fosfomycin after cultures are taken 5. Headache. Tylenol  as needed. Follow clinically. May need to MRV. If continues   Management plans discussed with the patient, family and they are in agreement.   DRUG ALLERGIES:  Allergies  Allergen Reactions  . Latex Rash and Other (See Comments)    Reaction:  Burning     CODE STATUS:     Code Status Orders        Start     Ordered   12/19/14 0220  Full code   Continuous     12/19/14 0219      TOTAL TIME TAKING CARE OF THIS PATIENT: 40 minutes.    Katharina CaperVAICKUTE,Tamra Koos M.D on 12/20/2014 at 7:12 AM  Between 7am to 6pm - Pager - [phone]  After 6pm go to [email or web address] - password [email or web address] [facility]  [facility]  Office  [phone]  CC: Primary care physician; [facility]

## 2016-03-08 ENCOUNTER — Encounter: Payer: Self-pay | Admitting: Intensive Care

## 2016-03-08 ENCOUNTER — Observation Stay
Admission: EM | Admit: 2016-03-08 | Discharge: 2016-03-10 | Disposition: A | Discharge status: Other Acute Inpatient Hospital | Payer: Medicaid Other | Attending: Internal Medicine | Admitting: Internal Medicine

## 2016-03-08 DIAGNOSIS — R4689 Other symptoms and signs involving appearance and behavior: Secondary | ICD-10-CM

## 2016-03-08 DIAGNOSIS — F17210 Nicotine dependence, cigarettes, uncomplicated: Secondary | ICD-10-CM | POA: Diagnosis not present

## 2016-03-08 DIAGNOSIS — Z79899 Other long term (current) drug therapy: Secondary | ICD-10-CM | POA: Insufficient documentation

## 2016-03-08 DIAGNOSIS — R45851 Suicidal ideations: Secondary | ICD-10-CM

## 2016-03-08 DIAGNOSIS — T391X2A Poisoning by 4-Aminophenol derivatives, intentional self-harm, initial encounter: Principal | ICD-10-CM | POA: Insufficient documentation

## 2016-03-08 DIAGNOSIS — R4589 Other symptoms and signs involving emotional state: Secondary | ICD-10-CM

## 2016-03-08 DIAGNOSIS — F329 Major depressive disorder, single episode, unspecified: Secondary | ICD-10-CM | POA: Diagnosis not present

## 2016-03-08 DIAGNOSIS — T391X1A Poisoning by 4-Aminophenol derivatives, accidental (unintentional), initial encounter: Secondary | ICD-10-CM | POA: Diagnosis present

## 2016-03-08 DIAGNOSIS — G40909 Epilepsy, unspecified, not intractable, without status epilepticus: Secondary | ICD-10-CM

## 2016-03-08 DIAGNOSIS — F32A Depression, unspecified: Secondary | ICD-10-CM

## 2016-03-08 HISTORY — DX: Depression, unspecified: F32.A

## 2016-03-08 HISTORY — DX: Major depressive disorder, single episode, unspecified: F32.9

## 2016-03-08 HISTORY — DX: Poisoning by 4-aminophenol derivatives, intentional self-harm, initial encounter: T39.1X2A

## 2016-03-08 LAB — CBC WITH DIFFERENTIAL/PLATELET
BASOS PCT: 1 %
Basophils Absolute: 0 10*3/uL (ref 0–0.1)
Eosinophils Absolute: 0.5 10*3/uL (ref 0–0.7)
Eosinophils Relative: 7 %
HEMATOCRIT: 35.9 % (ref 35.0–47.0)
HEMOGLOBIN: 12 g/dL (ref 12.0–16.0)
LYMPHS ABS: 3 10*3/uL (ref 1.0–3.6)
Lymphocytes Relative: 40 %
MCH: 31.3 pg (ref 26.0–34.0)
MCHC: 33.5 g/dL (ref 32.0–36.0)
MCV: 93.5 fL (ref 80.0–100.0)
MONOS PCT: 6 %
Monocytes Absolute: 0.5 10*3/uL (ref 0.2–0.9)
NEUTROS ABS: 3.4 10*3/uL (ref 1.4–6.5)
NEUTROS PCT: 46 %
Platelets: 293 10*3/uL (ref 150–440)
RBC: 3.84 MIL/uL (ref 3.80–5.20)
RDW: 13.1 % (ref 11.5–14.5)
WBC: 7.5 10*3/uL (ref 3.6–11.0)

## 2016-03-08 LAB — URINE DRUG SCREEN, QUALITATIVE (ARMC ONLY)
AMPHETAMINES, UR SCREEN: NOT DETECTED
BARBITURATES, UR SCREEN: NOT DETECTED
BENZODIAZEPINE, UR SCRN: NOT DETECTED
CANNABINOID 50 NG, UR ~~LOC~~: NOT DETECTED
Cocaine Metabolite,Ur ~~LOC~~: NOT DETECTED
MDMA (Ecstasy)Ur Screen: NOT DETECTED
Methadone Scn, Ur: NOT DETECTED
Opiate, Ur Screen: NOT DETECTED
PHENCYCLIDINE (PCP) UR S: NOT DETECTED
Tricyclic, Ur Screen: NOT DETECTED

## 2016-03-08 LAB — COMPREHENSIVE METABOLIC PANEL
ALBUMIN: 4.4 g/dL (ref 3.5–5.0)
ALK PHOS: 49 U/L (ref 38–126)
ALT: 19 U/L (ref 14–54)
ANION GAP: 5 (ref 5–15)
AST: 19 U/L (ref 15–41)
BILIRUBIN TOTAL: 0.6 mg/dL (ref 0.3–1.2)
BUN: 12 mg/dL (ref 6–20)
CALCIUM: 9.3 mg/dL (ref 8.9–10.3)
CO2: 28 mmol/L (ref 22–32)
CREATININE: 0.78 mg/dL (ref 0.44–1.00)
Chloride: 107 mmol/L (ref 101–111)
GFR calc Af Amer: >60 mL/min (ref >60)
GFR calc non Af Amer: >60 mL/min (ref >60)
GLUCOSE: 99 mg/dL (ref 65–99)
Potassium: 3.9 mmol/L (ref 3.5–5.1)
Sodium: 140 mmol/L (ref 135–145)
TOTAL PROTEIN: 7.9 g/dL (ref 6.5–8.1)

## 2016-03-08 LAB — SALICYLATE LEVEL: Salicylate Lvl: <7 mg/dL (ref 2.8–30.0)

## 2016-03-08 LAB — PROTIME-INR
INR: 1.11
PROTHROMBIN TIME: 14.4 s (ref 11.4–15.2)

## 2016-03-08 LAB — ACETAMINOPHEN LEVEL: Acetaminophen (Tylenol), Serum: 148 ug/mL — ABNORMAL HIGH (ref 10–30)

## 2016-03-08 LAB — ETHANOL

## 2016-03-08 LAB — PREGNANCY, URINE: PREG TEST UR: NEGATIVE

## 2016-03-08 MED ORDER — SODIUM CHLORIDE 0.9% FLUSH
3.0000 mL | Freq: Two times a day (BID) | INTRAVENOUS | Status: DC
  Administered 2016-03-08 – 2016-03-10 (×3): 3 mL via INTRAVENOUS

## 2016-03-08 MED ORDER — SODIUM CHLORIDE 0.9 % IV SOLN
Freq: Once | INTRAVENOUS | Status: AC
  Administered 2016-03-08: 16:00:00 via INTRAVENOUS

## 2016-03-08 MED ORDER — ACETYLCYSTEINE 200 MG/ML IV SOLN: 15.0000 mg/kg/h | INTRAVENOUS | Status: DC

## 2016-03-08 MED ORDER — ONDANSETRON HCL 4 MG PO TABS: 4.0000 mg | ORAL_TABLET | Freq: Four times a day (QID) | ORAL | Status: DC | PRN

## 2016-03-08 MED ORDER — FAMOTIDINE IN NACL 20-0.9 MG/50ML-% IV SOLN
20.0000 mg | Freq: Two times a day (BID) | INTRAVENOUS | Status: DC
  Filled 2016-03-08 (×2): qty 50

## 2016-03-08 MED ORDER — ALBUTEROL SULFATE (2.5 MG/3ML) 0.083% IN NEBU: 2.5000 mg | INHALATION_SOLUTION | Freq: Four times a day (QID) | RESPIRATORY_TRACT | Status: DC | PRN

## 2016-03-08 MED ORDER — DEXTROSE 5 % IV SOLN
15.0000 mg/kg/h | INTRAVENOUS | Status: DC
  Administered 2016-03-08: 15 mg/kg/h via INTRAVENOUS
  Filled 2016-03-08: qty 150

## 2016-03-08 MED ORDER — ALBUTEROL SULFATE HFA 108 (90 BASE) MCG/ACT IN AERS: 2.0000 | INHALATION_SPRAY | Freq: Four times a day (QID) | RESPIRATORY_TRACT | Status: DC | PRN

## 2016-03-08 MED ORDER — MAGNESIUM OXIDE 400 (241.3 MG) MG PO TABS
400.0000 mg | ORAL_TABLET | Freq: Every day | ORAL | Status: DC
  Administered 2016-03-09 – 2016-03-10 (×2): 400 mg via ORAL
  Filled 2016-03-08 (×2): qty 1

## 2016-03-08 MED ORDER — FAMOTIDINE 20 MG PO TABS
20.0000 mg | ORAL_TABLET | Freq: Two times a day (BID) | ORAL | Status: DC
  Administered 2016-03-08 – 2016-03-10 (×4): 20 mg via ORAL
  Filled 2016-03-08 (×4): qty 1

## 2016-03-08 MED ORDER — LEVETIRACETAM 500 MG PO TABS
500.0000 mg | ORAL_TABLET | Freq: Two times a day (BID) | ORAL | Status: DC
  Administered 2016-03-08 – 2016-03-10 (×4): 500 mg via ORAL
  Filled 2016-03-08 (×4): qty 1

## 2016-03-08 MED ORDER — ACETYLCYSTEINE LOAD VIA INFUSION
150.0000 mg/kg | Freq: Once | INTRAVENOUS | Status: AC
  Administered 2016-03-08: 8370 mg via INTRAVENOUS
  Filled 2016-03-08: qty 210

## 2016-03-08 MED ORDER — DOCUSATE SODIUM 100 MG PO CAPS
100.0000 mg | ORAL_CAPSULE | Freq: Two times a day (BID) | ORAL | Status: DC
  Administered 2016-03-08 – 2016-03-10 (×4): 100 mg via ORAL
  Filled 2016-03-08 (×4): qty 1

## 2016-03-08 MED ORDER — SODIUM CHLORIDE 0.9 % IV SOLN
INTRAVENOUS | Status: DC
  Administered 2016-03-08 – 2016-03-09 (×2): via INTRAVENOUS

## 2016-03-08 MED ORDER — BISACODYL 10 MG RE SUPP: 10.0000 mg | Freq: Every day | RECTAL | Status: DC | PRN

## 2016-03-08 MED ORDER — ONDANSETRON HCL 4 MG/2ML IJ SOLN
4.0000 mg | Freq: Four times a day (QID) | INTRAMUSCULAR | Status: DC | PRN
  Administered 2016-03-08: 4 mg via INTRAVENOUS
  Filled 2016-03-08: qty 2

## 2016-03-08 NOTE — ED Provider Notes (Signed)
[facility] Emergency Department Provider Note        Time seen: ----------------------------------------- 3:45 PM on 03/08/2016 -----------------------------------------    I have reviewed the triage vital signs and the nursing notes.   HISTORY  Chief Complaint Suicidal    HPI Veronica Brooks is a 21 y.o. female who presents to the ER for possible overdose. Patient states she has had thoughts of hurting herself, she hears voices telling her to herself. She states around 11 AM she took some Tylenol or what she thought was pain medication that she is not really sure. She is not sure how much she took. Mother called her and stated she took 20 Tylenol because she wanted to die. She denies fevers, chills or other complaints.   Past Medical History:  Diagnosis Date  . Bronchitis   . Depression   . Heart murmur    Does not require cardiologist  . Medical history non-contributory     Patient Active Problem List   Diagnosis Date Noted  . Seizure (HCC) 12/19/2014  . Hypotension 12/19/2014  . Hypokalemia 12/19/2014  . UTI (urinary tract infection) 12/19/2014  . Postictal headache 12/19/2014  . Headache 12/19/2014  . Pregnancy 03/23/2014  . UTI in pregnancy 03/21/2014  . Suspected macroscopic fetus 03/21/2014  . LGA (large for gestational age) fetus affecting management of mother 03/21/2014  . Fetal renal anomaly   . Fetal macrosomia   . [redacted] weeks gestation of pregnancy   . High risk teen pregnancy in third trimester 02/14/2014  . [redacted] weeks gestation of pregnancy   . Late prenatal care 01/17/2014    Past Surgical History:  Procedure Laterality Date  . NO PAST SURGERIES      Allergies Latex  Social History Social History  Substance Use Topics  . Smoking status: Current Some Day Smoker    Types: Cigarettes  . Smokeless tobacco: Never Used  . Alcohol use No    Review of Systems Constitutional: Negative for fever. Cardiovascular: Negative for  chest pain. Respiratory: Negative for shortness of breath. Gastrointestinal: Negative for abdominal pain, vomiting and diarrhea. Genitourinary: Negative for dysuria. Musculoskeletal: Negative for back pain. Skin: Negative for rash. Neurological: Negative for headaches, focal weakness or numbness. Psychiatric: Positive for suicidal ideation, depression 10-point ROS otherwise negative.  ____________________________________________   PHYSICAL EXAM:  VITAL SIGNS: ED Triage Vitals  Enc Vitals Group     BP 03/08/16 1537 126/69     Pulse Rate 03/08/16 1537 67     Resp 03/08/16 1537 18     Temp 03/08/16 1537 98.5 F (36.9 C)     Temp Source 03/08/16 1537 Oral     SpO2 03/08/16 1537 100 %     Weight 03/08/16 1538 123 lb (55.8 kg)     Height 03/08/16 1538 5\' 1"  (1.549 m)     Head Circumference --      Peak Flow --      Pain Score --      Pain Loc --      Pain Edu? --      Excl. in GC? --    Constitutional: Alert and oriented. Well appearing and in no distress. Eyes: Conjunctivae are normal. PERRL. Normal extraocular movements. ENT   Head: Normocephalic and atraumatic.   Nose: No congestion/rhinnorhea.   Mouth/Throat: Mucous membranes are moist.   Neck: No stridor. Cardiovascular: Normal rate, regular rhythm. No murmurs, rubs, or gallops. Respiratory: Normal respiratory effort without tachypnea nor retractions. Breath sounds are clear and  equal bilaterally. No wheezes/rales/rhonchi. Gastrointestinal: Soft and nontender. Normal bowel sounds Musculoskeletal: Nontender with normal range of motion in all extremities. No lower extremity tenderness nor edema. Neurologic:  Normal speech and language. No gross focal neurologic deficits are appreciated.  Skin:  Skin is warm, dry and intact. No rash noted. Psychiatric: Depressed mood and affect ____________________________________________  ED COURSE:  Pertinent labs & imaging results that were available during my care of  the patient were reviewed by me and considered in my medical decision making (see chart for details). Clinical Course    Patient presents to the ER for depression and possible overdose, we will assess with labs and reevaluate.  EKG: Interpreted by me, sinus rhythm rate of 64 bpm, normal PR interval, normal QRS, normal QT, normal axis.  Procedures ____________________________________________   LABS (pertinent positives/negatives)  Labs Reviewed  ACETAMINOPHEN LEVEL - Abnormal; Notable for the following:       Result Value   Acetaminophen (Tylenol), Serum 148 (*)    All other components within normal limits  CBC WITH DIFFERENTIAL/PLATELET  COMPREHENSIVE METABOLIC PANEL  URINE DRUG SCREEN, QUALITATIVE ([facility] ONLY)  ETHANOL  SALICYLATE LEVEL  PREGNANCY, URINE    ____________________________________________  FINAL ASSESSMENT AND PLAN  depression, suicidal ideation, Tylenol overdose  Plan: Patient with labs  as dictated above. Patient's 4 hour Tylenol level is at the toxic range according to the [place] Matthew nomogram. She will be started on acetylcysteine and admitted on acetylcysteine drip. I will discuss with the hospitalist for admission.   Emily Filbert, MD   Note: This dictation was prepared with Dragon dictation. Any transcriptional errors that result from this process are unintentional    Emily Filbert, MD 03/08/16 [phone]

## 2016-03-08 NOTE — ED Triage Notes (Addendum)
Patient arrived by EMS for possible Tylenol overdose and suicidal ideations. Tylenol ingestion happened around 2:30pm 03/08/2016 per mother. Pt states "I took Tylenol around 11:00" Per mother, patient called her and stated "I took about 20 Tylenol because I want to die" Mother called PD/EMS. Patient ambulatory into ED with NAD noted.

## 2016-03-08 NOTE — Progress Notes (Signed)
Contacted Dr. Emmit PomfretHugelmeyer. Pt attempted to walk to bathroom and become very dizzy and weak. Pt was helped by the aide to return to the bed. VSS,. Pt has also received PRN zofran for nausea. No new orders at this time.

## 2016-03-08 NOTE — ED Notes (Signed)
Poison Control called back for vitals and recommendations. Poison controls recommendations already in process

## 2016-03-08 NOTE — H&P (Signed)
History and Physical    [name] ZOX:[id number] DOB: 09/11/1995 DOA: 03/08/2016  Referring physician: Dr. Mayford Knife PCP: [facility]  Specialists: none  Chief Complaint: HA and depression  HPI: Veronica Brooks is a 21 y.o. female has a past medical history significant for depression who presents to ER after intentional Tylenol OD trying to commit suicide. Tylenol levels in ER are elevated. Pt c/o depression and HA. She is now admitted. No hx of previous suicide attempt per pt. No fever. Denies CP or SOB. No N/V/D  Review of Systems: The patient denies anorexia, fever, weight loss,, vision loss, decreased hearing, hoarseness, chest pain, syncope, dyspnea on exertion, peripheral edema, balance deficits, hemoptysis, abdominal pain, melena, hematochezia, severe indigestion/heartburn, hematuria, incontinence, genital sores, muscle weakness, suspicious skin lesions, transient blindness, difficulty walking, depression, unusual weight change, abnormal bleeding, enlarged lymph nodes, angioedema, and breast masses.   Past Medical History:  Diagnosis Date  . Bronchitis   . Depression   . Heart murmur    Does not require cardiologist  . Medical history non-contributory    Past Surgical History:  Procedure Laterality Date  . NO PAST SURGERIES     Social History:  reports that she has been smoking Cigarettes.  She has never used smokeless tobacco. She reports that she does not drink alcohol or use drugs.  Allergies  Allergen Reactions  . Latex Rash and Other (See Comments)    Reaction:  Burning     Family History  Problem Relation Age of Onset  . Diabetes Maternal Grandmother   . Cancer Maternal Grandfather     Prior to Admission medications   Medication Sig Start Date End Date Taking? Authorizing Provider  albuterol (PROVENTIL HFA;VENTOLIN HFA) 108 (90 BASE) MCG/ACT inhaler Inhale 2 puffs into the lungs every 6 (six) hours as needed for wheezing or shortness of breath.     Historical Provider, MD  cephALEXin (KEFLEX) 500 MG capsule Take 1 capsule (500 mg total) by mouth 4 (four) times daily. Patient not taking: Reported on 12/18/2014 10/26/14   Hayden Rasmussen, NP  levETIRAcetam (KEPPRA) 500 MG tablet Take 1 tablet (500 mg total) by mouth 2 (two) times daily. 12/20/14   Katharina Caper, MD  lidocaine-hydrocortisone (ANAMANTEL HC) 3-0.5 % CREA Place 1 Applicatorful rectally 2 (two) times daily. Patient not taking: Reported on 12/18/2014 10/26/14   Hayden Rasmussen, NP  magnesium oxide (MAG-OX) 400 (241.3 MG) MG tablet Take 1 tablet (400 mg total) by mouth every 4 (four) hours as needed (Mag < 1.8). 12/20/14   Katharina Caper, MD  metroNIDAZOLE (FLAGYL) 500 MG tablet Take 1 tablet (500 mg total) by mouth 2 (two) times daily. X 7 days Patient not taking: Reported on 12/18/2014 10/26/14   Hayden Rasmussen, NP   Physical Exam: Vitals:   03/08/16 1537 03/08/16 1538  BP: 126/69   Pulse: 67   Resp: 18   Temp: 98.5 F (36.9 C)   TempSrc: Oral   SpO2: 100%   Weight:  55.8 kg (123 lb)  Height:  5\' 1"  (1.549 m)     General:  No apparent distress, WDWN, Brock/AT  Eyes: PERRL, EOMI, no scleral icterus, conjunctiva clear  ENT: moist oropharynx without exudate, TM's benign, dentition good  Neck: supple, no lymphadenopathy. No bruits or thyromegaly  Cardiovascular: regular rate without MRG; 2+ peripheral pulses, no JVD, no peripheral edema  Respiratory: CTA biL, good air movement without wheezing, rhonchi or crackled. Respiratory effort normal  Abdomen: soft, non tender to palpation, positive  bowel sounds, no guarding, no rebound  Skin: no rashes or lesions  Musculoskeletal: normal bulk and tone, no joint swelling  Psychiatric: normal mood and affect, A&OX3  Neurologic: CN 2-12 grossly intact, Motor strength 5/5 in all 4 groups with symmetric DTR's and non=-focal sensory exam  Labs on Admission:  Basic Metabolic Panel:  Recent Labs Lab 03/08/16 1546  NA 140  K 3.9  CL 107   CO2 28  GLUCOSE 99  BUN 12  CREATININE 0.78  CALCIUM 9.3   Liver Function Tests:  Recent Labs Lab 03/08/16 1546  AST 19  ALT 19  ALKPHOS 49  BILITOT 0.6  PROT 7.9  ALBUMIN 4.4   No results for input(s): LIPASE, AMYLASE in the last 168 hours. No results for input(s): AMMONIA in the last 168 hours. CBC:  Recent Labs Lab 03/08/16 1546  WBC 7.5  NEUTROABS 3.4  HGB 12.0  HCT 35.9  MCV 93.5  PLT 293   Cardiac Enzymes: No results for input(s): CKTOTAL, CKMB, CKMBINDEX, TROPONINI in the last 168 hours.  BNP (last 3 results) No results for input(s): BNP in the last 8760 hours.  ProBNP (last 3 results) No results for input(s): PROBNP in the last 8760 hours.  CBG: No results for input(s): GLUCAP in the last 168 hours.  Radiological Exams on Admission: No results found.  EKG: Independently reviewed.  Assessment/Plan Principal Problem:   Acetaminophen overdose, intentional self-harm, initial encounter [facility](HCC) Active Problems:   Seizure disorder (HCC)   Depression   Suicidal behavior   Will observe on floor with telemetry and suicidal precautions. Begin Acetadote infusion. Cpnsult Psych. Repeat labs in AM.  Diet: clear liquids Fluids: NS@125  DVT Prophylaxis: TED hose  Code Status: FULL  Family Communication: none  Disposition Plan: per Psych  Time spent: 50 min

## 2016-03-08 NOTE — Consult Note (Signed)
Pt presents with APAP overdose. [facility]. Pt was started on NAC, 150mg /kg load followed by a 15mg /kg/hr drip. APAP level was elevated, but baseline labs normal. Still awaiting baseline INR. Will recheck AST/ALT and APAP level at 22hr from intital of drip. Pharmacy to continue to monitor  Olene FlossMelissa D Chai Routh, Pharm.D, BCPS Clinical Pharmacist

## 2016-03-08 NOTE — ED Notes (Signed)
Pt dressed out by this tech. Pt calm and cooperative during the process. Pt belongings consisted of a pair of black bedroom slippers, leopard bra, a pair of black leggings, pink cell phone, white cell phone charger, a pair of white ear phones, multicolored jacket, black and white head wrap and a white ring which was placed in a specimen cup then placed in bag with pt belongings.

## 2016-03-08 NOTE — ED Notes (Signed)
Belongings sent up bedside with patient

## 2016-03-08 NOTE — Progress Notes (Signed)
Patient admitted to floor; denies pain at this time; sitter at bedside; pleasant at this time. Suicide precautions in place; Will continue to monitor.

## 2016-03-09 DIAGNOSIS — T391X2A Poisoning by 4-Aminophenol derivatives, intentional self-harm, initial encounter: Secondary | ICD-10-CM

## 2016-03-09 LAB — COMPREHENSIVE METABOLIC PANEL
ALBUMIN: 2.9 g/dL — AB (ref 3.5–5.0)
ALK PHOS: 32 U/L — AB (ref 38–126)
ALT: 14 U/L (ref 14–54)
ALT: 15 U/L (ref 14–54)
ANION GAP: 4 — AB (ref 5–15)
AST: 13 U/L — AB (ref 15–41)
AST: 17 U/L (ref 15–41)
Albumin: 3.6 g/dL (ref 3.5–5.0)
Alkaline Phosphatase: 43 U/L (ref 38–126)
Anion gap: 4 — ABNORMAL LOW (ref 5–15)
BILIRUBIN TOTAL: 0.6 mg/dL (ref 0.3–1.2)
BUN: 6 mg/dL (ref 6–20)
BUN: 6 mg/dL (ref 6–20)
CALCIUM: 8 mg/dL — AB (ref 8.9–10.3)
CO2: 22 mmol/L (ref 22–32)
CO2: 23 mmol/L (ref 22–32)
CREATININE: 0.62 mg/dL (ref 0.44–1.00)
CREATININE: 0.69 mg/dL (ref 0.44–1.00)
Calcium: 8.8 mg/dL — ABNORMAL LOW (ref 8.9–10.3)
Chloride: 112 mmol/L — ABNORMAL HIGH (ref 101–111)
Chloride: 111 mmol/L (ref 101–111)
GFR calc Af Amer: >60 mL/min (ref >60)
GFR calc Af Amer: >60 mL/min (ref >60)
GFR calc non Af Amer: >60 mL/min (ref >60)
GLUCOSE: 86 mg/dL (ref 65–99)
Glucose, Bld: 105 mg/dL — ABNORMAL HIGH (ref 65–99)
POTASSIUM: 3.7 mmol/L (ref 3.5–5.1)
Potassium: 3.6 mmol/L (ref 3.5–5.1)
SODIUM: 138 mmol/L (ref 135–145)
Sodium: 138 mmol/L (ref 135–145)
TOTAL PROTEIN: 6.7 g/dL (ref 6.5–8.1)
Total Bilirubin: 0.6 mg/dL (ref 0.3–1.2)
Total Protein: 5.5 g/dL — ABNORMAL LOW (ref 6.5–8.1)

## 2016-03-09 LAB — CBC
HCT: 32 % — ABNORMAL LOW (ref 35.0–47.0)
HEMOGLOBIN: 10.9 g/dL — AB (ref 12.0–16.0)
MCH: 31.7 pg (ref 26.0–34.0)
MCHC: 34 g/dL (ref 32.0–36.0)
MCV: 93.1 fL (ref 80.0–100.0)
Platelets: 258 10*3/uL (ref 150–440)
RBC: 3.44 MIL/uL — ABNORMAL LOW (ref 3.80–5.20)
RDW: 12.5 % (ref 11.5–14.5)
WBC: 7.1 10*3/uL (ref 3.6–11.0)

## 2016-03-09 LAB — ACETAMINOPHEN LEVEL: ACETAMINOPHEN (TYLENOL), SERUM: 10 ug/mL (ref 10–30)

## 2016-03-09 LAB — PROTIME-INR
INR: 1.24
PROTHROMBIN TIME: 15.7 s — AB (ref 11.4–15.2)

## 2016-03-09 MED ORDER — IBUPROFEN 400 MG PO TABS
200.0000 mg | ORAL_TABLET | ORAL | Status: AC
  Administered 2016-03-09: 200 mg via ORAL
  Filled 2016-03-09: qty 1

## 2016-03-09 NOTE — Plan of Care (Signed)
Problem: Health Behavior/Discharge Planning: Goal: Ability to manage health-related needs will improve Outcome: Not Progressing Pt continues to report thoughts of SI. Pt is quiet and withdrawn.

## 2016-03-09 NOTE — Progress Notes (Signed)
[facility] [place] [facility]   PATIENT NAME: Veronica GoreDeja Brooks    MR#:  [id number]  DATE OF BIRTH:  [birth year]  SUBJECTIVE: Seen at bedside, denies nausea or vomiting. No abdominal pain. Tylenol level this morning around 5 AM normal to 10, liver functions AST, ALT, INR also within normal range, INR is less  than 2. Normal AST, normal ALT.called poison  control center at [phone] spoke with Virgilio FreesJene won (RN), he suggested to discontinue N-acetylcysteine drip. Patient received around 16 hours of advised to restrict drip so far. And her liver functions this morning within normal range including Tylenol level of 10. That meets the criteria to the stop NAC drip.Marland Kitchen. And we will start the patient on regular diet.   CHIEF COMPLAINT:   Chief Complaint  Patient presents with  . Suicidal    REVIEW OF SYSTEMS:   ROS CONSTITUTIONAL: No fever, fatigue or weakness.  EYES: No blurred or double vision.  EARS, NOSE, AND THROAT: No tinnitus or ear pain.  RESPIRATORY: No cough, shortness of breath, wheezing or hemoptysis.  CARDIOVASCULAR: No chest pain, orthopnea, edema.  GASTROINTESTINAL: No nausea, vomiting, diarrhea or abdominal pain.  GENITOURINARY: No dysuria, hematuria.  ENDOCRINE: No polyuria, nocturia,  HEMATOLOGY: No anemia, easy bruising or bleeding SKIN: No rash or lesion. MUSCULOSKELETAL: No joint pain or arthritis.   NEUROLOGIC: No tingling, numbness, weakness.  PSYCHIATRY: depression,.   DRUG ALLERGIES:   Allergies  Allergen Reactions  . Latex Rash and Other (See Comments)    Reaction:  Burning     VITALS:  Blood pressure (!) 122/57, pulse (!) 58, temperature 98.2 F (36.8 C), temperature source Oral, resp. rate 19, height 5\' 1"  (1.549 m), weight 58.7 kg (129 lb 8 oz), last menstrual period 02/29/2016, SpO2 100 %, unknown if currently breastfeeding.  PHYSICAL EXAMINATION:  GENERAL:  21 y.o.-year-old patient lying in the bed with no acute distress.   EYES: Pupils equal, round, reactive to light and accommodation. No scleral icterus. Extraocular muscles intact.  HEENT: Head atraumatic, normocephalic. Oropharynx and nasopharynx clear.  NECK:  Supple, no jugular venous distention. No thyroid enlargement, no tenderness.  LUNGS: Normal breath sounds bilaterally, no wheezing, rales,rhonchi or crepitation. No use of accessory muscles of respiration.  CARDIOVASCULAR: S1, S2 normal. No murmurs, rubs, or gallops.  ABDOMEN: Soft, nontender, nondistended. Bowel sounds present. No organomegaly or mass.  EXTREMITIES: No pedal edema, cyanosis, or clubbing.  NEUROLOGIC: Cranial nerves II through XII are intact. Muscle strength 5/5 in all extremities. Sensation intact. Gait not checked.  PSYCHIATRIC: The patient is alert and oriented x 3.  SKIN: No obvious rash, lesion, or ulcer.    LABORATORY PANEL:   CBC  Recent Labs Lab 03/09/16 0458  WBC 7.1  HGB 10.9*  HCT 32.0*  PLT 258   ------------------------------------------------------------------------------------------------------------------  Chemistries   Recent Labs Lab 03/09/16 0458  NA 138  K 3.6  CL 112*  CO2 22  GLUCOSE 86  BUN 6  CREATININE 0.62  CALCIUM 8.0*  AST 13*  ALT 14  ALKPHOS 32*  BILITOT 0.6   ------------------------------------------------------------------------------------------------------------------  Cardiac Enzymes No results for input(s): TROPONINI in the last 168 hours. ------------------------------------------------------------------------------------------------------------------  RADIOLOGY:  No results found.  EKG:   Orders placed or performed during the hospital encounter of 03/08/16  . ED EKG  . ED EKG    ASSESSMENT AND PLAN:  Tylenol Overdose with Suicidal Attempt: Patient Received NAC  Drip  For 17 Hours. Patient liver function this morning 5  a.m. normal including Tylenol level of 10. Patient's Tylenol level was 148 and she came.  Poison control recommended discontinuing the N-acetylcysteine drip based on her liver function data, Tylenol level, INR from this morning. Further indication to continue the drip, no further indication to repeat liver functions or tylenol level.d/c iv fluids,start regular diet.medically stable  To trasfer to psych if they think she needs inpt psych, #2 suicidal attempt: Continue IVC, psychiatric consult.  Medically stable.   All the records are reviewed and case discussed with Care Management/Social Workerr. Management plans discussed with the patient, family and they are in agreement.  CODE STATUS:full  TOTAL TIME TAKING CARE OF THIS PATIENT:35 minminutes.   POSSIBLE D/C IN  1-2 DAYS, DEPENDING ON CLINICAL CONDITION.   Katha Hamming M.D on 03/09/2016 at 11:57 AM  Between 7am to 6pm - Pager - [phone]  After 6pm go to [email or web address] - password [email or web address] [facility]  [facility]  Office  [phone]  CC: Primary care physician; [facility]   Note: This dictation was prepared with Dragon dictation along with smaller phrase technology. Any transcriptional errors that result from this process are unintentional.

## 2016-03-09 NOTE — Consult Note (Signed)
[facility] Consult   Reason for Consult:  Consult for 21 year old woman who is in the hospital after an overdose of acetaminophen Referring Physician:  Vianne Bulls Patient Identification: Veronica Brooks MRN:  [id number] Principal Diagnosis: Acetaminophen overdose, intentional self-harm, initial encounter [facility]) Diagnosis:   Patient Active Problem List   Diagnosis Date Noted  . Acetaminophen overdose, intentional self-harm, initial encounter ([place]) [T39.1X2A] 03/08/2016  . Depression [F32.9] 03/08/2016  . Suicidal behavior [R46.89] 03/08/2016  . Tylenol toxicity [T39.1X1A] 03/08/2016  . Seizure disorder ([place]) [F00.712] 12/19/2014  . Hypotension [I95.9] 12/19/2014  . Hypokalemia [E87.6] 12/19/2014  . UTI (urinary tract infection) [N39.0] 12/19/2014  . Postictal headache [G44.89] 12/19/2014  . Headache [R51] 12/19/2014  . Pregnancy [Z34.90] 03/23/2014  . UTI in pregnancy [O23.40] 03/21/2014  . Suspected macroscopic fetus [O36.60X0] 03/21/2014  . LGA (large for gestational age) fetus affecting management of mother [O36.60X0] 03/21/2014  . 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 Fetal macrosomia [O36.60X0]   . [redacted] weeks gestation of pregnancy [Z3A.38]   . High risk teen pregnancy in third trimester [O09.893] 02/14/2014  . [redacted] weeks gestation of pregnancy [Z3A.31]   . Late prenatal care [O09.30] 01/17/2014    Total Time spent with patient: 1 hour  Subjective:   Veronica Brooks is a 21 y.o. female patient admitted with "I took an overdose of Tylenol".  HPI:  Patient interviewed. Chart reviewed. This is a 21 year old woman who says that yesterday she took an overdose of Tylenol with the intention of wanting to kill her self. She was at home with her grandmother and her 2 young children. She says that she had been thinking about it for a while. Mood is been depressed for a long time. Sleep is okay. Appetite is okay. Says that she feels down and negative a lot. She also tells me that she hears  the voice of Jesus and the devil talking to her fairly regularly. She is not receiving any kind of outpatient mental health treatment. She is not able to relate any specific stress current or past to explain the recent events.  Social history: Has 2 young children ages 55 and 2. Lives with her grandmother. Not working outside the home.  Medical history: Patient has seizures that developed in the last few years. Unclear etiology. Takes Keppra for them. Says that she last had a seizure a couple months ago but doesn't have much of a sense of them. Otherwise no known medical problems  Substance abuse history: Patient denies abuse of alcohol or drugs.  Past Psychiatric History: No past psychiatric history known of. Never tried to kill her self before. No past history of treatment for depression or any other mental health problems.  Risk to Self: Is patient at risk for suicide?: Yes Risk to Others:   Prior Inpatient Therapy:   Prior Outpatient Therapy:    Past Medical History:  Past Medical History:  Diagnosis Date  . Bronchitis   . Depression   . Heart murmur    Does not require cardiologist  . Medical history non-contributory     Past Surgical History:  Procedure Laterality Date  . NO PAST SURGERIES     Family History:  Family History  Problem Relation Age of Onset  . Diabetes Maternal Grandmother   . Cancer Maternal Grandfather    Family Psychiatric  History: She says she had a cousin and an aunt with depression and think she had an aunt who tried to kill her self. Social History:  History  Alcohol Use No     History  Drug Use No    Social History   Social History  . Marital status: Single    Spouse name: N/A  . Number of children: N/A  . Years of education: N/A   Social History Main Topics  . Smoking status: Current Some Day Smoker    Types: Cigarettes  . Smokeless tobacco: Never Used  . Alcohol use No  . Drug use: No  . Sexual activity: Yes    Birth control/  protection: None   Other Topics Concern  . None   Social History Narrative  . None   Additional Social History:    Allergies:   Allergies  Allergen Reactions  . Latex Rash and Other (See Comments)    Reaction:  Burning     Labs:  Results for orders placed or performed during the hospital encounter of 03/08/16 (from the past 48 hour(s))  CBC with Differential/Platelet     Status: None   Collection Time: 03/08/16  3:46 PM  Result Value Ref Range   WBC 7.5 3.6 - 11.0 K/uL   RBC 3.84 3.80 - 5.20 MIL/uL   Hemoglobin 12.0 12.0 - 16.0 g/dL   HCT 35.9 35.0 - 47.0 %   MCV 93.5 80.0 - 100.0 fL   MCH 31.3 26.0 - 34.0 pg   MCHC 33.5 32.0 - 36.0 g/dL   RDW 13.1 11.5 - 14.5 %   Platelets 293 150 - 440 K/uL   Neutrophils Relative % 46 %   Neutro Abs 3.4 1.4 - 6.5 K/uL   Lymphocytes Relative 40 %   Lymphs Abs 3.0 1.0 - 3.6 K/uL   Monocytes Relative 6 %   Monocytes Absolute 0.5 0.2 - 0.9 K/uL   Eosinophils Relative 7 %   Eosinophils Absolute 0.5 0 - 0.7 K/uL   Basophils Relative 1 %   Basophils Absolute 0.0 0 - 0.1 K/uL  Comprehensive metabolic panel     Status: None   Collection Time: 03/08/16  3:46 PM  Result Value Ref Range   Sodium 140 135 - 145 mmol/L   Potassium 3.9 3.5 - 5.1 mmol/L   Chloride 107 101 - 111 mmol/L   CO2 28 22 - 32 mmol/L   Glucose, Bld 99 65 - 99 mg/dL   BUN 12 6 - 20 mg/dL   Creatinine, Ser 0.78 0.44 - 1.00 mg/dL   Calcium 9.3 8.9 - 10.3 mg/dL   Total Protein 7.9 6.5 - 8.1 g/dL   Albumin 4.4 3.5 - 5.0 g/dL   AST 19 15 - 41 U/L   ALT 19 14 - 54 U/L   Alkaline Phosphatase 49 38 - 126 U/L   Total Bilirubin 0.6 0.3 - 1.2 mg/dL   GFR calc non Af Amer >60 >60 mL/min   GFR calc Af Amer >60 >60 mL/min    Comment: (NOTE) The eGFR has been calculated using the CKD EPI equation. This calculation has not been validated in all clinical situations. eGFR's persistently <60 mL/min signify possible Chronic Kidney Disease.    Anion gap 5 5 - 15  Urine Drug  Screen, Qualitative ([facility] only)     Status: None   Collection Time: 03/08/16  3:46 PM  Result Value Ref Range   Tricyclic, Ur Screen NONE DETECTED NONE DETECTED   Amphetamines, Ur Screen NONE DETECTED NONE DETECTED   MDMA (Ecstasy)Ur Screen NONE DETECTED NONE DETECTED   Cocaine Metabolite,Ur [place] NONE DETECTED NONE DETECTED   Opiate, Ur  Screen NONE DETECTED NONE DETECTED   Phencyclidine (PCP) Ur S NONE DETECTED NONE DETECTED   Cannabinoid 50 Ng, Ur  NONE DETECTED NONE DETECTED   Barbiturates, Ur Screen NONE DETECTED NONE DETECTED   Benzodiazepine, Ur Scrn NONE DETECTED NONE DETECTED   Methadone Scn, Ur NONE DETECTED NONE DETECTED    Comment: (NOTE) 242  Tricyclics, urine               Cutoff 1000 ng/mL 200  Amphetamines, urine             Cutoff 1000 ng/mL 300  MDMA (Ecstasy), urine           Cutoff 500 ng/mL 400  Cocaine Metabolite, urine       Cutoff 300 ng/mL 500  Opiate, urine                   Cutoff 300 ng/mL 600  Phencyclidine (PCP), urine      Cutoff 25 ng/mL 700  Cannabinoid, urine              Cutoff 50 ng/mL 800  Barbiturates, urine             Cutoff 200 ng/mL 900  Benzodiazepine, urine           Cutoff 200 ng/mL 1000 Methadone, urine                Cutoff 300 ng/mL 1100 1200 The urine drug screen provides only a preliminary, unconfirmed 1300 analytical test result and should not be used for non-medical 1400 purposes. Clinical consideration and professional judgment should 1500 be applied to any positive drug screen result due to possible 1600 interfering substances. A more specific alternate chemical method 1700 must be used in order to obtain a confirmed analytical result.  1800 Gas chromato graphy / mass spectrometry (GC/MS) is the preferred 1900 confirmatory method.   Ethanol     Status: None   Collection Time: 03/08/16  3:46 PM  Result Value Ref Range   Alcohol, Ethyl (B) <5 <5 mg/dL    Comment:        LOWEST DETECTABLE LIMIT FOR SERUM ALCOHOL IS 5 mg/dL FOR  MEDICAL PURPOSES ONLY   Acetaminophen level     Status: Abnormal   Collection Time: 03/08/16  3:46 PM  Result Value Ref Range   Acetaminophen (Tylenol), Serum 148 (H) 10 - 30 ug/mL    Comment:        THERAPEUTIC CONCENTRATIONS VARY SIGNIFICANTLY. A RANGE OF 10-30 ug/mL MAY BE AN EFFECTIVE CONCENTRATION FOR MANY PATIENTS. HOWEVER, SOME ARE BEST TREATED AT CONCENTRATIONS OUTSIDE THIS RANGE. ACETAMINOPHEN CONCENTRATIONS >150 ug/mL AT 4 HOURS AFTER INGESTION AND >50 ug/mL AT 12 HOURS AFTER INGESTION ARE OFTEN ASSOCIATED WITH TOXIC REACTIONS.   Salicylate level     Status: None   Collection Time: 03/08/16  3:46 PM  Result Value Ref Range   Salicylate Lvl <3.5 2.8 - 30.0 mg/dL  Pregnancy, urine     Status: None   Collection Time: 03/08/16  3:46 PM  Result Value Ref Range   Preg Test, Ur NEGATIVE NEGATIVE  Protime-INR     Status: None   Collection Time: 03/08/16  7:08 PM  Result Value Ref Range   Prothrombin Time 14.4 11.4 - 15.2 seconds   INR 1.11   Acetaminophen level     Status: None   Collection Time: 03/09/16  4:58 AM  Result Value Ref Range   Acetaminophen (Tylenol), Serum 10 10 - 30  ug/mL    Comment:        THERAPEUTIC CONCENTRATIONS VARY SIGNIFICANTLY. A RANGE OF 10-30 ug/mL MAY BE AN EFFECTIVE CONCENTRATION FOR MANY PATIENTS. HOWEVER, SOME ARE BEST TREATED AT CONCENTRATIONS OUTSIDE THIS RANGE. ACETAMINOPHEN CONCENTRATIONS >150 ug/mL AT 4 HOURS AFTER INGESTION AND >50 ug/mL AT 12 HOURS AFTER INGESTION ARE OFTEN ASSOCIATED WITH TOXIC REACTIONS.   Comprehensive metabolic panel     Status: Abnormal   Collection Time: 03/09/16  4:58 AM  Result Value Ref Range   Sodium 138 135 - 145 mmol/L   Potassium 3.6 3.5 - 5.1 mmol/L   Chloride 112 (H) 101 - 111 mmol/L   CO2 22 22 - 32 mmol/L   Glucose, Bld 86 65 - 99 mg/dL   BUN 6 6 - 20 mg/dL   Creatinine, Ser 0.62 0.44 - 1.00 mg/dL   Calcium 8.0 (L) 8.9 - 10.3 mg/dL   Total Protein 5.5 (L) 6.5 - 8.1 g/dL   Albumin  2.9 (L) 3.5 - 5.0 g/dL   AST 13 (L) 15 - 41 U/L   ALT 14 14 - 54 U/L   Alkaline Phosphatase 32 (L) 38 - 126 U/L   Total Bilirubin 0.6 0.3 - 1.2 mg/dL   GFR calc non Af Amer >60 >60 mL/min   GFR calc Af Amer >60 >60 mL/min    Comment: (NOTE) The eGFR has been calculated using the CKD EPI equation. This calculation has not been validated in all clinical situations. eGFR's persistently <60 mL/min signify possible Chronic Kidney Disease.    Anion gap 4 (L) 5 - 15  CBC     Status: Abnormal   Collection Time: 03/09/16  4:58 AM  Result Value Ref Range   WBC 7.1 3.6 - 11.0 K/uL   RBC 3.44 (L) 3.80 - 5.20 MIL/uL   Hemoglobin 10.9 (L) 12.0 - 16.0 g/dL   HCT 32.0 (L) 35.0 - 47.0 %   MCV 93.1 80.0 - 100.0 fL   MCH 31.7 26.0 - 34.0 pg   MCHC 34.0 32.0 - 36.0 g/dL   RDW 12.5 11.5 - 14.5 %   Platelets 258 150 - 440 K/uL  Protime-INR     Status: Abnormal   Collection Time: 03/09/16  4:58 AM  Result Value Ref Range   Prothrombin Time 15.7 (H) 11.4 - 15.2 seconds   INR 1.24   Comprehensive metabolic panel     Status: Abnormal   Collection Time: 03/09/16  4:12 PM  Result Value Ref Range   Sodium 138 135 - 145 mmol/L   Potassium 3.7 3.5 - 5.1 mmol/L   Chloride 111 101 - 111 mmol/L   CO2 23 22 - 32 mmol/L   Glucose, Bld 105 (H) 65 - 99 mg/dL   BUN 6 6 - 20 mg/dL   Creatinine, Ser 0.69 0.44 - 1.00 mg/dL   Calcium 8.8 (L) 8.9 - 10.3 mg/dL   Total Protein 6.7 6.5 - 8.1 g/dL   Albumin 3.6 3.5 - 5.0 g/dL   AST 17 15 - 41 U/L   ALT 15 14 - 54 U/L   Alkaline Phosphatase 43 38 - 126 U/L   Total Bilirubin 0.6 0.3 - 1.2 mg/dL   GFR calc non Af Amer >60 >60 mL/min   GFR calc Af Amer >60 >60 mL/min    Comment: (NOTE) The eGFR has been calculated using the CKD EPI equation. This calculation has not been validated in all clinical situations. eGFR's persistently <60 mL/min signify possible Chronic Kidney Disease.  Anion gap 4 (L) 5 - 15  Acetaminophen level     Status: Abnormal   Collection  Time: 03/09/16  4:12 PM  Result Value Ref Range   Acetaminophen (Tylenol), Serum <10 (L) 10 - 30 ug/mL    Comment:        THERAPEUTIC CONCENTRATIONS VARY SIGNIFICANTLY. A RANGE OF 10-30 ug/mL MAY BE AN EFFECTIVE CONCENTRATION FOR MANY PATIENTS. HOWEVER, SOME ARE BEST TREATED AT CONCENTRATIONS OUTSIDE THIS RANGE. ACETAMINOPHEN CONCENTRATIONS >150 ug/mL AT 4 HOURS AFTER INGESTION AND >50 ug/mL AT 12 HOURS AFTER INGESTION ARE OFTEN ASSOCIATED WITH TOXIC REACTIONS.     Current Facility-Administered Medications  Medication Dose Route Frequency Provider Last Rate Last Dose  . albuterol (PROVENTIL) (2.5 MG/3ML) 0.083% nebulizer solution 2.5 mg  2.5 mg Nebulization Q6H PRN Idelle Crouch, MD      . bisacodyl (DULCOLAX) suppository 10 mg  10 mg Rectal Daily PRN Idelle Crouch, MD      . docusate sodium (COLACE) capsule 100 mg  100 mg Oral BID Idelle Crouch, MD   100 mg at 03/09/16 1001  . famotidine (PEPCID) tablet 20 mg  20 mg Oral Q12H Alexis Hugelmeyer, DO   20 mg at 03/09/16 1001  . levETIRAcetam (KEPPRA) tablet 500 mg  500 mg Oral BID Idelle Crouch, MD   500 mg at 03/09/16 1001  . magnesium oxide (MAG-OX) tablet 400 mg  400 mg Oral Daily Idelle Crouch, MD   400 mg at 03/09/16 1001  . ondansetron (ZOFRAN) tablet 4 mg  4 mg Oral Q6H PRN Idelle Crouch, MD       Or  . ondansetron [facility]) injection 4 mg  4 mg Intravenous Q6H PRN Idelle Crouch, MD   4 mg at 03/08/16 1948  . sodium chloride flush (NS) 0.9 % injection 3 mL  3 mL Intravenous Q12H Idelle Crouch, MD   3 mL at 03/08/16 2204    Musculoskeletal: Strength & Muscle Tone: within normal limits Gait & Station: normal Patient leans: N/A  Psychiatric Specialty Exam: Physical Exam  Nursing note and vitals reviewed. Constitutional: She appears well-developed and well-nourished.  HENT:  Head: Normocephalic and atraumatic.  Eyes: Conjunctivae are normal. Pupils are equal, round, and reactive to light.  Neck:  Normal range of motion.  Cardiovascular: Normal heart sounds.   Respiratory: Effort normal. No respiratory distress.  GI: Soft.  Musculoskeletal: Normal range of motion.  Neurological: She is alert.  Skin: Skin is warm and dry.  Psychiatric: Her speech is delayed. She is slowed. Cognition and memory are normal. She expresses impulsivity. She exhibits a depressed mood. She expresses suicidal ideation.    Review of Systems  Constitutional: Negative.   HENT: Negative.   Eyes: Negative.   Respiratory: Negative.   Cardiovascular: Negative.   Gastrointestinal: Negative.   Musculoskeletal: Negative.   Skin: Negative.   Neurological: Negative.   Psychiatric/Behavioral: Positive for depression and suicidal ideas. Negative for hallucinations, memory loss and substance abuse. The patient is not nervous/anxious and does not have insomnia.     Blood pressure 130/64, pulse (!) 59, temperature 97.9 F (36.6 C), temperature source Oral, resp. rate 17, height _0  (1.549 m), weight 58.7 kg (129 lb 8 oz), last menstrual period 02/29/2016, SpO2 100 %, unknown if currently breastfeeding.Body mass index is 24.47 kg/m.  General Appearance: Casual  Eye Contact:  Fair  Speech:  Slow  Volume:  Decreased  Mood:  Depressed  Affect:  Blunt  Thought  Process:  Goal Directed  Orientation:  Full (Time, Place, and Person)  Thought Content:  Logical  Suicidal Thoughts:  Yes.  with intent/plan  Homicidal Thoughts:  No  Memory:  Immediate;   Good Recent;   Fair Remote;   Fair  Judgement:  Impaired  Insight:  Shallow  Psychomotor Activity:  Decreased  Concentration:  Concentration: Fair  Recall:  [employer] of Knowledge:  Fair  Language:  Fair  Akathisia:  No  Handed:  Right  AIMS (if indicated):     Assets:  Desire for Improvement Housing Physical Health Social Support  ADL's:  Intact  Cognition:  WNL  Sleep:        Treatment Plan Summary: Plan 21 year old woman who made a very serious and  nearly fatal suicide attempt. Multiple symptoms of severe depression. Also possible psychotic symptoms. Patient clearly should be admitted to the psychiatric hospital. Case reviewed with TTS. Continue IVC. I'm not going to start any medicine yet at this point and will defer probably to the inpatient team. He was informed of the plan then was not disagreeable  Disposition: Recommend psychiatric Inpatient admission when medically cleared. Supportive therapy provided about ongoing stressors.  Alethia Berthold, MD 03/09/2016 7:09 PM

## 2016-03-10 ENCOUNTER — Inpatient Hospital Stay
Admission: AD | Admit: 2016-03-10 | Discharge: 2016-03-11 | DRG: 885 | Disposition: A | Discharge status: Home / Self Care | Payer: No Typology Code available for payment source | Source: Intra-hospital | Attending: Psychiatry | Admitting: Psychiatry

## 2016-03-10 DIAGNOSIS — Z79899 Other long term (current) drug therapy: Secondary | ICD-10-CM

## 2016-03-10 DIAGNOSIS — Z820 Family history of epilepsy and other diseases of the nervous system: Secondary | ICD-10-CM

## 2016-03-10 DIAGNOSIS — F332 Major depressive disorder, recurrent severe without psychotic features: Secondary | ICD-10-CM | POA: Diagnosis present

## 2016-03-10 DIAGNOSIS — G40909 Epilepsy, unspecified, not intractable, without status epilepticus: Secondary | ICD-10-CM | POA: Diagnosis present

## 2016-03-10 DIAGNOSIS — Z888 Allergy status to other drugs, medicaments and biological substances status: Secondary | ICD-10-CM

## 2016-03-10 DIAGNOSIS — Z818 Family history of other mental and behavioral disorders: Secondary | ICD-10-CM | POA: Diagnosis not present

## 2016-03-10 DIAGNOSIS — N390 Urinary tract infection, site not specified: Secondary | ICD-10-CM | POA: Diagnosis present

## 2016-03-10 DIAGNOSIS — Z809 Family history of malignant neoplasm, unspecified: Secondary | ICD-10-CM

## 2016-03-10 DIAGNOSIS — F17210 Nicotine dependence, cigarettes, uncomplicated: Secondary | ICD-10-CM | POA: Diagnosis present

## 2016-03-10 DIAGNOSIS — T1491XA Suicide attempt, initial encounter: Secondary | ICD-10-CM | POA: Diagnosis present

## 2016-03-10 DIAGNOSIS — T391X2A Poisoning by 4-Aminophenol derivatives, intentional self-harm, initial encounter: Secondary | ICD-10-CM | POA: Diagnosis present

## 2016-03-10 DIAGNOSIS — Z833 Family history of diabetes mellitus: Secondary | ICD-10-CM

## 2016-03-10 DIAGNOSIS — G4700 Insomnia, unspecified: Secondary | ICD-10-CM | POA: Diagnosis present

## 2016-03-10 DIAGNOSIS — Z91040 Latex allergy status: Secondary | ICD-10-CM

## 2016-03-10 MED ORDER — TRAZODONE HCL 100 MG PO TABS
100.0000 mg | ORAL_TABLET | Freq: Every evening | ORAL | Status: DC | PRN
  Administered 2016-03-11: 100 mg via ORAL
  Filled 2016-03-10: qty 1

## 2016-03-10 MED ORDER — MAGNESIUM HYDROXIDE 400 MG/5ML PO SUSP: 30.0000 mL | Freq: Every day | ORAL | Status: DC | PRN

## 2016-03-10 MED ORDER — LEVETIRACETAM 500 MG PO TABS
500.0000 mg | ORAL_TABLET | Freq: Two times a day (BID) | ORAL | Status: DC
  Administered 2016-03-11 (×2): 500 mg via ORAL
  Filled 2016-03-10 (×2): qty 1

## 2016-03-10 NOTE — Discharge Summary (Signed)
Veronica Brooks, is a 21 y.o. female  DOB 27-Oct-1995  MRN [id number].  Admission date:  03/08/2016  Admitting Physician  Marguarite Arbour, MD  Discharge Date:  03/10/2016   Primary MD  [facility]  Recommendations for primary care physician for things to follow:   Patient is stable for discharge to psych unit.   Admission Diagnosis  Suicidal ideation [R45.851] Tylenol overdose, intentional self-harm, initial encounter ([facility]) [T39.1X2A]   Discharge Diagnosis  Suicidal ideation [R45.851] Tylenol overdose, intentional self-harm, initial encounter ([facility]) [T39.1X2A]   Principal Problem:   Acetaminophen overdose, intentional self-harm, initial encounter [facility]) Active Problems:   Seizure disorder ([facility])   Depression   Suicidal behavior   Tylenol toxicity      Past Medical History:  Diagnosis Date  . Bronchitis   . Depression   . Heart murmur    Does not require cardiologist  . Medical history non-contributory     Past Surgical History:  Procedure Laterality Date  . NO PAST SURGERIES         History of present illness and  Hospital Course:     Kindly see H&P for history of present illness and admission details, please review complete Labs, Consult reports and Test reports for all details in brief  HPI  from the history and physical done on the day of admission * -21 year-old female patient admitted to medical unit because of  Tylenol overdose with suicidal attempt.   Hospital Course  #1 acetaminophen overdose with suicidal attempt: Admitted to medical unit, patient's Tylenol level on admission 148, normal LFTs. Patient to CBC, renal function within normal limits on admission. Admitted to medical unit, poison control center informed, started on N-acetylcysteine infusion. Patient received an intestinal system  for about 17 hours.  liver functions, INR stayed within normal limits, a Tylenol level yesterday morning at 5 AM showed 10. So I called poison control center again, they recommended to discontinue N-acetylcysteine because of normal liver function testing, normal INR. So we discontinued N-acetylcysteine, IV hydration, started on regular diet. Patient to medical history stable for discharge to psych unit, continue IVC, one-to-one observation. Seen by psychiatric test because of suicide attempt with overdose,    Discharge Condition:stable   Follow UP      Discharge Instructions  and  Discharge Medications      Allergies as of 03/10/2016      Reactions   Latex Rash, Other (See Comments)   Reaction:  Burning       Medication List    STOP taking these medications   magnesium oxide 400 (241.3 Mg) MG tablet Commonly known as:  MAG-OX     TAKE these medications   albuterol 108 (90 Base) MCG/ACT inhaler Commonly known as:  PROVENTIL HFA;VENTOLIN HFA Inhale 2 puffs into the lungs every 6 (six) hours as needed for wheezing or shortness of breath.   levETIRAcetam 500 MG tablet Commonly known as:  KEPPRA Take 1 tablet (500 mg total) by mouth 2 (two) times daily.         Diet and Activity recommendation: See Discharge Instructions above   Consults obtained - psych   Major procedures and Radiology Reports - PLEASE review detailed and final reports for all details, in brief -     No results found.  Micro Results    No results found for this or any previous visit (from the past 240 hour(s)).     Today   Subjective:   Veronica Brooks today  StableStable for transfer to psych unit.  Objective:   Blood pressure 100/65, pulse 77, temperature 98 F (36.7 C), temperature source Oral, resp. rate 18, height 5\' 1"  (1.549 m), weight 58.6 kg (129 lb 3.2 oz), last menstrual period 02/29/2016, SpO2 100 %, unknown if currently breastfeeding.   Intake/Output Summary (Last 24  hours) at 03/10/16 0925 Last data filed at 03/09/16 2337  Gross per 24 hour  Intake              723 ml  Output             2950 ml  Net            -2227 ml    Exam Awake Alert, Oriented x 3, No new F.N deficits, Normal affect [place].AT,PERRAL Supple Neck,No JVD, No cervical lymphadenopathy appriciated.  Symmetrical Chest wall movement, Good air movement bilaterally, CTAB RRR,No Gallops,Rubs or new Murmurs, No Parasternal Heave +ve B.Sounds, Abd Soft, Non tender, No organomegaly appriciated, No rebound -guarding or rigidity. No Cyanosis, Clubbing or edema, No new Rash or bruise  Data Review   CBC w Diff:  Lab Results  Component Value Date   WBC 7.1 03/09/2016   HGB 10.9 (L) 03/09/2016   HGB 13.6 04/29/2012   HCT 32.0 (L) 03/09/2016   HCT 41.9 04/29/2012   PLT 258 03/09/2016   PLT 260 04/29/2012   LYMPHOPCT 40 03/08/2016   LYMPHOPCT 48.3 04/29/2012   MONOPCT 6 03/08/2016   MONOPCT 6.3 04/29/2012   EOSPCT 7 03/08/2016   EOSPCT 5.7 04/29/2012   BASOPCT 1 03/08/2016   BASOPCT 0.3 04/29/2012    CMP:  Lab Results  Component Value Date   NA 138 03/09/2016   NA 141 04/29/2012   K 3.7 03/09/2016   K 3.8 04/29/2012   CL 111 03/09/2016   CL 111 (H) 04/29/2012   CO2 23 03/09/2016   CO2 24 04/29/2012   BUN 6 03/09/2016   BUN 16 04/29/2012   CREATININE 0.69 03/09/2016   CREATININE 0.90 04/29/2012   PROT 6.7 03/09/2016   PROT 7.7 04/29/2012   ALBUMIN 3.6 03/09/2016   ALBUMIN 4.0 04/29/2012   BILITOT 0.6 03/09/2016   BILITOT 0.6 04/29/2012   ALKPHOS 43 03/09/2016   ALKPHOS 95 04/29/2012   AST 17 03/09/2016   AST 19 04/29/2012   ALT 15 03/09/2016   ALT 27 04/29/2012  .   Total Time in preparing paper work, data evaluation and todays exam - 35 minutes  Adalberto Metzgar M.D on 03/10/2016 at 9:25 AM    Note: This dictation was prepared with Dragon dictation along with smaller phrase technology. Any transcriptional errors that result from this process are  unintentional.

## 2016-03-10 NOTE — BH Assessment (Addendum)
[facility] Dr. Toni Amendlapacs.  Attending Physician will be Dr. Ardyth HarpsHernandez.   Patient [facility] room 306, [facility] Nurse Edwena BundeJanet J.   Intake Paper Work has been signed and placed on patient chart.  2C staff is aware of the admission [name](Marlyin, 2C Sect; Annabelle, Patient's Nurse).  Patient pre-admitted by Patient Access Clydie Braun(Karen)

## 2016-03-10 NOTE — Consult Note (Signed)
[place] Consult   Reason for Consult:  Consult for 21 year old woman who is in the hospital after an overdose of acetaminophen Referring Physician:  Vianne Bulls Patient Identification: Veronica Brooks MRN:  [id number] Principal Diagnosis: Acetaminophen overdose, intentional self-harm, initial encounter [facility]) Diagnosis:   Patient Active Problem List   Diagnosis Date Noted  . Acetaminophen overdose, intentional self-harm, initial encounter ([place]) [T39.1X2A] 03/08/2016  . Depression [F32.9] 03/08/2016  . Suicidal behavior [R46.89] 03/08/2016  . Tylenol toxicity [T39.1X1A] 03/08/2016  . Seizure disorder ([place]) [N62.952] 12/19/2014  . Hypotension [I95.9] 12/19/2014  . Hypokalemia [E87.6] 12/19/2014  . UTI (urinary tract infection) [N39.0] 12/19/2014  . Postictal headache [G44.89] 12/19/2014  . Headache [R51] 12/19/2014  . Pregnancy [Z34.90] 03/23/2014  . UTI in pregnancy [O23.40] 03/21/2014  . Suspected macroscopic fetus [O36.60X0] 03/21/2014  . LGA (large for gestational age) fetus affecting management of mother [O36.60X0] 03/21/2014  . 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 Fetal macrosomia [O36.60X0]   . [redacted] weeks gestation of pregnancy [Z3A.38]   . High risk teen pregnancy in third trimester [O09.893] 02/14/2014  . [redacted] weeks gestation of pregnancy [Z3A.31]   . Late prenatal care [O09.30] 01/17/2014    Total Time spent with patient: 20 minutes  Subjective:   Veronica Brooks is a 21 y.o. female patient admitted with "I took an overdose of Tylenol".  Follow-up for Tuesday the 19th. Patient seen. She denies acute suicidal intent but her affect is still depressed down and flat. Still showing signs and symptoms of depression. Medical service has discontinued acetylcysteine and she appears to be stable for transfer to psychiatry.  HPI:  Patient interviewed. Chart reviewed. This is a 21 year old woman who says that yesterday she took an overdose of Tylenol with the intention of wanting to  kill her self. She was at home with her grandmother and her 2 young children. She says that she had been thinking about it for a while. Mood is been depressed for a long time. Sleep is okay. Appetite is okay. Says that she feels down and negative a lot. She also tells me that she hears the voice of Jesus and the devil talking to her fairly regularly. She is not receiving any kind of outpatient mental health treatment. She is not able to relate any specific stress current or past to explain the recent events.  Social history: Has 2 young children ages 67 and 2. Lives with her grandmother. Not working outside the home.  Medical history: Patient has seizures that developed in the last few years. Unclear etiology. Takes Keppra for them. Says that she last had a seizure a couple months ago but doesn't have much of a sense of them. Otherwise no known medical problems  Substance abuse history: Patient denies abuse of alcohol or drugs.  Past Psychiatric History: No past psychiatric history known of. Never tried to kill her self before. No past history of treatment for depression or any other mental health problems.  Risk to Self: Is patient at risk for suicide?: Yes Risk to Others:   Prior Inpatient Therapy:   Prior Outpatient Therapy:    Past Medical History:  Past Medical History:  Diagnosis Date  . Bronchitis   . Depression   . Heart murmur    Does not require cardiologist  . Medical history non-contributory     Past Surgical History:  Procedure Laterality Date  . NO PAST SURGERIES     Family History:  Family History  Problem Relation Age of Onset  .  Diabetes Maternal Grandmother   . Cancer Maternal Grandfather    Family Psychiatric  History: She says she had a cousin and an aunt with depression and think she had an aunt who tried to kill her self. Social History:  History  Alcohol Use No     History  Drug Use No    Social History   Social History  . Marital status: Single     Spouse name: N/A  . Number of children: N/A  . Years of education: N/A   Social History Main Topics  . Smoking status: Current Some Day Smoker    Types: Cigarettes  . Smokeless tobacco: Never Used  . Alcohol use No  . Drug use: No  . Sexual activity: Yes    Birth control/ protection: None   Other Topics Concern  . None   Social History Narrative  . None   Additional Social History:    Allergies:   Allergies  Allergen Reactions  . Latex Rash and Other (See Comments)    Reaction:  Burning     Labs:  Results for orders placed or performed during the hospital encounter of 03/08/16 (from the past 48 hour(s))  Acetaminophen level     Status: None   Collection Time: 03/09/16  4:58 AM  Result Value Ref Range   Acetaminophen (Tylenol), Serum 10 10 - 30 ug/mL    Comment:        THERAPEUTIC CONCENTRATIONS VARY SIGNIFICANTLY. A RANGE OF 10-30 ug/mL MAY BE AN EFFECTIVE CONCENTRATION FOR MANY PATIENTS. HOWEVER, SOME ARE BEST TREATED AT CONCENTRATIONS OUTSIDE THIS RANGE. ACETAMINOPHEN CONCENTRATIONS >150 ug/mL AT 4 HOURS AFTER INGESTION AND >50 ug/mL AT 12 HOURS AFTER INGESTION ARE OFTEN ASSOCIATED WITH TOXIC REACTIONS.   Comprehensive metabolic panel     Status: Abnormal   Collection Time: 03/09/16  4:58 AM  Result Value Ref Range   Sodium 138 135 - 145 mmol/L   Potassium 3.6 3.5 - 5.1 mmol/L   Chloride 112 (H) 101 - 111 mmol/L   CO2 22 22 - 32 mmol/L   Glucose, Bld 86 65 - 99 mg/dL   BUN 6 6 - 20 mg/dL   Creatinine, Ser 0.62 0.44 - 1.00 mg/dL   Calcium 8.0 (L) 8.9 - 10.3 mg/dL   Total Protein 5.5 (L) 6.5 - 8.1 g/dL   Albumin 2.9 (L) 3.5 - 5.0 g/dL   AST 13 (L) 15 - 41 U/L   ALT 14 14 - 54 U/L   Alkaline Phosphatase 32 (L) 38 - 126 U/L   Total Bilirubin 0.6 0.3 - 1.2 mg/dL   GFR calc non Af Amer >60 >60 mL/min   GFR calc Af Amer >60 >60 mL/min    Comment: (NOTE) The eGFR has been calculated using the CKD EPI equation. This calculation has not been validated in  all clinical situations. eGFR's persistently <60 mL/min signify possible Chronic Kidney Disease.    Anion gap 4 (L) 5 - 15  CBC     Status: Abnormal   Collection Time: 03/09/16  4:58 AM  Result Value Ref Range   WBC 7.1 3.6 - 11.0 K/uL   RBC 3.44 (L) 3.80 - 5.20 MIL/uL   Hemoglobin 10.9 (L) 12.0 - 16.0 g/dL   HCT 32.0 (L) 35.0 - 47.0 %   MCV 93.1 80.0 - 100.0 fL   MCH 31.7 26.0 - 34.0 pg   MCHC 34.0 32.0 - 36.0 g/dL   RDW 12.5 11.5 - 14.5 %   Platelets 258  150 - 440 K/uL  Protime-INR     Status: Abnormal   Collection Time: 03/09/16  4:58 AM  Result Value Ref Range   Prothrombin Time 15.7 (H) 11.4 - 15.2 seconds   INR 1.24   Comprehensive metabolic panel     Status: Abnormal   Collection Time: 03/09/16  4:12 PM  Result Value Ref Range   Sodium 138 135 - 145 mmol/L   Potassium 3.7 3.5 - 5.1 mmol/L   Chloride 111 101 - 111 mmol/L   CO2 23 22 - 32 mmol/L   Glucose, Bld 105 (H) 65 - 99 mg/dL   BUN 6 6 - 20 mg/dL   Creatinine, Ser 0.69 0.44 - 1.00 mg/dL   Calcium 8.8 (L) 8.9 - 10.3 mg/dL   Total Protein 6.7 6.5 - 8.1 g/dL   Albumin 3.6 3.5 - 5.0 g/dL   AST 17 15 - 41 U/L   ALT 15 14 - 54 U/L   Alkaline Phosphatase 43 38 - 126 U/L   Total Bilirubin 0.6 0.3 - 1.2 mg/dL   GFR calc non Af Amer >60 >60 mL/min   GFR calc Af Amer >60 >60 mL/min    Comment: (NOTE) The eGFR has been calculated using the CKD EPI equation. This calculation has not been validated in all clinical situations. eGFR's persistently <60 mL/min signify possible Chronic Kidney Disease.    Anion gap 4 (L) 5 - 15  Acetaminophen level     Status: Abnormal   Collection Time: 03/09/16  4:12 PM  Result Value Ref Range   Acetaminophen (Tylenol), Serum <10 (L) 10 - 30 ug/mL    Comment:        THERAPEUTIC CONCENTRATIONS VARY SIGNIFICANTLY. A RANGE OF 10-30 ug/mL MAY BE AN EFFECTIVE CONCENTRATION FOR MANY PATIENTS. HOWEVER, SOME ARE BEST TREATED AT CONCENTRATIONS OUTSIDE THIS RANGE. ACETAMINOPHEN  CONCENTRATIONS >150 ug/mL AT 4 HOURS AFTER INGESTION AND >50 ug/mL AT 12 HOURS AFTER INGESTION ARE OFTEN ASSOCIATED WITH TOXIC REACTIONS.     Current Facility-Administered Medications  Medication Dose Route Frequency Provider Last Rate Last Dose  . albuterol (PROVENTIL) (2.5 MG/3ML) 0.083% nebulizer solution 2.5 mg  2.5 mg Nebulization Q6H PRN Idelle Crouch, MD      . bisacodyl (DULCOLAX) suppository 10 mg  10 mg Rectal Daily PRN Idelle Crouch, MD      . docusate sodium (COLACE) capsule 100 mg  100 mg Oral BID Idelle Crouch, MD   100 mg at 03/10/16 1003  . famotidine (PEPCID) tablet 20 mg  20 mg Oral Q12H Alexis Hugelmeyer, DO   20 mg at 03/10/16 1003  . levETIRAcetam (KEPPRA) tablet 500 mg  500 mg Oral BID Idelle Crouch, MD   500 mg at 03/10/16 1003  . magnesium oxide (MAG-OX) tablet 400 mg  400 mg Oral Daily Idelle Crouch, MD   400 mg at 03/10/16 1003  . ondansetron (ZOFRAN) tablet 4 mg  4 mg Oral Q6H PRN Idelle Crouch, MD       Or  . ondansetron [facility]) injection 4 mg  4 mg Intravenous Q6H PRN Idelle Crouch, MD   4 mg at 03/08/16 1948  . sodium chloride flush (NS) 0.9 % injection 3 mL  3 mL Intravenous Q12H Idelle Crouch, MD   3 mL at 03/10/16 1000    Musculoskeletal: Strength & Muscle Tone: within normal limits Gait & Station: normal Patient leans: N/A  Psychiatric Specialty Exam: Physical Exam  Nursing note and vitals  reviewed. Constitutional: She appears well-developed and well-nourished.  HENT:  Head: Normocephalic and atraumatic.  Eyes: Conjunctivae are normal. Pupils are equal, round, and reactive to light.  Neck: Normal range of motion.  Cardiovascular: Normal heart sounds.   Respiratory: Effort normal. No respiratory distress.  GI: Soft.  Musculoskeletal: Normal range of motion.  Neurological: She is alert.  Skin: Skin is warm and dry.  Psychiatric: Her speech is delayed. She is slowed. Cognition and memory are normal. She expresses  impulsivity. She exhibits a depressed mood. She expresses suicidal ideation.    Review of Systems  Constitutional: Negative.   HENT: Negative.   Eyes: Negative.   Respiratory: Negative.   Cardiovascular: Negative.   Gastrointestinal: Negative.   Musculoskeletal: Negative.   Skin: Negative.   Neurological: Negative.   Psychiatric/Behavioral: Positive for depression and suicidal ideas. Negative for hallucinations, memory loss and substance abuse. The patient is not nervous/anxious and does not have insomnia.     Blood pressure 100/65, pulse 77, temperature 98 F (36.7 C), temperature source Oral, resp. rate 18, height _0  (1.549 m), weight 58.6 kg (129 lb 3.2 oz), last menstrual period 02/29/2016, SpO2 100 %, unknown if currently breastfeeding.Body mass index is 24.41 kg/m.  General Appearance: Casual  Eye Contact:  Fair  Speech:  Slow  Volume:  Decreased  Mood:  Depressed  Affect:  Blunt  Thought Process:  Goal Directed  Orientation:  Full (Time, Place, and Person)  Thought Content:  Logical  Suicidal Thoughts:  Yes.  with intent/plan  Homicidal Thoughts:  No  Memory:  Immediate;   Good Recent;   Fair Remote;   Fair  Judgement:  Impaired  Insight:  Shallow  Psychomotor Activity:  Decreased  Concentration:  Concentration: Fair  Recall:  [employer] of Knowledge:  Fair  Language:  Fair  Akathisia:  No  Handed:  Right  AIMS (if indicated):     Assets:  Desire for Improvement Housing Physical Health Social Support  ADL's:  Intact  Cognition:  WNL  Sleep:        Treatment Plan Summary: Plan Case reviewed with TTS. I will put in transfer orders. Patient can be transferred to the psychiatric service as soon as a bed is available. Continue IVC. I have still not started any antidepressant medication but when necessary medicine will be provided. Patient understands the plan.  Disposition: Recommend psychiatric Inpatient admission when medically cleared. Supportive therapy  provided about ongoing stressors.  Alethia Berthold, MD 03/10/2016 8:02 PM

## 2016-03-11 DIAGNOSIS — F332 Major depressive disorder, recurrent severe without psychotic features: Principal | ICD-10-CM

## 2016-03-11 LAB — LIPID PANEL
CHOL/HDL RATIO: 4.2 ratio
Cholesterol: 144 mg/dL (ref 0–200)
HDL: 34 mg/dL — AB (ref >40)
LDL CALC: 91 mg/dL (ref 0–99)
Triglycerides: 93 mg/dL (ref <150)
VLDL: 19 mg/dL (ref 0–40)

## 2016-03-11 LAB — TSH: TSH: 1.133 u[IU]/mL (ref 0.350–4.500)

## 2016-03-11 LAB — LEVETIRACETAM LEVEL: Levetiracetam Lvl: 8.7 ug/mL — ABNORMAL LOW (ref 10.0–40.0)

## 2016-03-11 MED ORDER — SERTRALINE HCL 50 MG PO TABS: 50.0000 mg | ORAL_TABLET | Freq: Every day | ORAL | Status: DC

## 2016-03-11 MED ORDER — FOSFOMYCIN TROMETHAMINE 3 G PO PACK
3.0000 g | PACK | Freq: Once | ORAL | Status: DC
  Filled 2016-03-11: qty 3

## 2016-03-11 MED ORDER — LEVETIRACETAM 500 MG PO TABS: 500.0000 mg | ORAL_TABLET | Freq: Two times a day (BID) | ORAL | 6 refills | Status: DC

## 2016-03-11 MED ORDER — SERTRALINE HCL 50 MG PO TABS: 50.0000 mg | ORAL_TABLET | Freq: Every day | ORAL | 2 refills | Status: DC

## 2016-03-11 NOTE — Progress Notes (Deleted)
Patient ID: Veronica Brooks, female   DOB: 12/03/1995, 21 y.o.   MRN: [id number]  Patient is discharging within 24 hours of admission. Psychosocial assessment by CSW is not required. Patient will follow-up with [facility] for outpatient services including medication management and outpatient therapy.   Maylene Crocker G. Garnette CzechSampson MSW, LCSWA 03/11/2016 11:08 AM

## 2016-03-11 NOTE — Tx Team (Signed)
Initial Treatment Plan 03/11/2016 3:50 AM [name] [id number]    PATIENT STRESSORS: Financial difficulties Occupational concerns   PATIENT STRENGTHS: Ability for insight Average or above average intelligence Communication skills General fund of knowledge Motivation for treatment/growth   PATIENT IDENTIFIED PROBLEMS:   Suicidal Ideation  Depression                 DISCHARGE CRITERIA:  Improved stabilization in mood, thinking, and/or behavior  PRELIMINARY DISCHARGE PLAN: Outpatient therapy  PATIENT/FAMILY INVOLVEMENT: This treatment plan has been presented to and reviewed with the patient, Veronica Brooks, and/or family member.  The patient and family have been given the opportunity to ask questions and make suggestions.  Marion DownerHerbin, Jettson Crable Denaye, RN 03/11/2016, 3:50 AM

## 2016-03-11 NOTE — H&P (Signed)
Psychiatric Admission Assessment Adult  Patient Identification: Veronica Brooks MRN:  [id number] Date of Evaluation:  03/11/2016 Chief Complaint:  depression Principal Diagnosis: Severe recurrent major depression without psychotic features [facility]) Diagnosis:   Patient Active Problem List   Diagnosis Date Noted  . Severe recurrent major depression without psychotic features (HCC) [F33.2] 03/10/2016  . Acetaminophen overdose, intentional self-harm, initial encounter (HCC) [T39.1X2A] 03/08/2016  . Depression [F32.9] 03/08/2016  . Suicidal behavior [R46.89] 03/08/2016  . Tylenol toxicity [T39.1X1A] 03/08/2016  . Seizure disorder (HCC) [G40.909] 12/19/2014  . Hypotension [I95.9] 12/19/2014  . Hypokalemia [E87.6] 12/19/2014  . UTI (urinary tract infection) [N39.0] 12/19/2014  . Postictal headache [G44.89] 12/19/2014  . Headache [R51] 12/19/2014  . Pregnancy [Z34.90] 03/23/2014  . UTI in pregnancy [O23.40] 03/21/2014  . Suspected macroscopic fetus [O36.60X0] 03/21/2014  . LGA (large for gestational age) fetus affecting management of mother [O36.60X0] 03/21/2014  . 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 Fetal macrosomia [O36.60X0]   . [redacted] weeks gestation of pregnancy [Z3A.38]   . High risk teen pregnancy in third trimester [O09.893] 02/14/2014  . [redacted] weeks gestation of pregnancy [Z3A.31]   . Late prenatal care [O09.30] 01/17/2014   History of Present Illness:   Identifying data. Veronica Brooks is a 21 year old female with a history of untreated depression.  Chief complaint. "This was a big mistake."  History of present illness. Information was obtained from the patient and the chart. The patient reports symptoms of depression since she was 21 years old. She never received any treatment for depression. In the past few weeks she has been under considerable stress. She was trying to obtain an independent apartment but it turned out to be impossible. She started getting increasingly depressed with poor sleep,  decreased appetite, anhedonia, feeling of guilt and hopelessness worthlessness, poor energy and concentration, social isolation crying spells. She denies having suicidal ideation or planning her overdose. On Sunday she received another disappointing news and took an overdose of Tylenol. Immediately after she called her mother and came to the hospital. She was briefly hospitalized on medical floor for treatment of Tylenol overdose. She was transferred to psychiatry for further treatment. The patient denies any psychotic symptoms or symptoms suggestive of bipolar mania. She reports one anxiety attack at the time when she was taking Tylenol but no panic disorder symptoms. She denies PTSD or OCD symptoms as well. She does not use alcohol or illicit substances.  Past psychiatric history. She was never hospitalized or attempted suicide before. She was never treated with medications or therapy.  Family psychiatric history. Her aunt was diagnosed with depression after the diagnosis of cancer.  Social history. She did not graduate from high school as she has her first baby at the age of 28. She lives with her grandmother and great grandmother who has Alzheimer's. She takes care of her two children ages 27 and 2. She has Medicaid and receives food stamps. She is unemployed as there is no transportation. At the patient reports another major stressor in her life. Her father who has been in prison for most of her life is being released from prison in 3 weeks' time. The patient believes that in her life she is not in a place that would get her father's approval.  Total Time spent with patient: 1 hour  Is the patient at risk to self? No.  Has the patient been a risk to self in the past 6 months? Yes.    Has the patient been a risk to  self within the distant past? No.  Is the patient a risk to others? No.  Has the patient been a risk to others in the past 6 months? No.  Has the patient been a risk to others within the  distant past? No.   Prior Inpatient Therapy:   Prior Outpatient Therapy:    Alcohol Screening: 1. How often do you have a drink containing alcohol?: Never 9. Have you or someone else been injured as a result of your drinking?: No 10. Has a relative or friend or a doctor or another health worker been concerned about your drinking or suggested you cut down?: No Alcohol Use Disorder Identification Test Final Score (AUDIT): 0 Brief Intervention: AUDIT score less than 7 or less-screening does not suggest unhealthy drinking-brief intervention not indicated Substance Abuse History in the last 12 months:  No. Consequences of Substance Abuse: NA Previous Psychotropic Medications: No  Psychological Evaluations: No  Past Medical History:  Past Medical History:  Diagnosis Date  . Bronchitis   . Depression   . Heart murmur    Does not require cardiologist  . Medical history non-contributory     Past Surgical History:  Procedure Laterality Date  . NO PAST SURGERIES     Family History:  Family History  Problem Relation Age of Onset  . Diabetes Maternal Grandmother   . Cancer Maternal Grandfather    Tobacco Screening: Have you used any form of tobacco in the last 30 days? (Cigarettes, Smokeless Tobacco, Cigars, and/or Pipes): Yes Tobacco use, Select all that apply: 5 or more cigarettes per day Are you interested in Tobacco Cessation Medications?: No, patient refused Counseled patient on smoking cessation including recognizing danger situations, developing coping skills and basic information about quitting provided: Refused/Declined practical counseling Social History:  History  Alcohol Use No     History  Drug Use No    Additional Social History:                           Allergies:   Allergies  Allergen Reactions  . Latex Rash and Other (See Comments)    Reaction:  Burning    Lab Results:  Results for orders placed or performed during the hospital encounter of  03/10/16 (from the past 48 hour(s))  Lipid panel     Status: Abnormal   Collection Time: 03/11/16  7:15 AM  Result Value Ref Range   Cholesterol 144 0 - 200 mg/dL   Triglycerides 93 <914 mg/dL   HDL 34 (L) >78 mg/dL   Total CHOL/HDL Ratio 4.2 RATIO   VLDL 19 0 - 40 mg/dL   LDL Cholesterol 91 0 - 99 mg/dL    Comment:        Total Cholesterol/HDL:CHD Risk Coronary Heart Disease Risk Table                     Men   Women  1/2 Average Risk   3.4   3.3  Average Risk       5.0   4.4  2 X Average Risk   9.6   7.1  3 X Average Risk  23.4   11.0        Use the calculated Patient Ratio above and the CHD Risk Table to determine the patient's CHD Risk.        ATP III CLASSIFICATION (LDL):  <100     mg/dL   Optimal  295-621  mg/dL  Near or Above                    Optimal  130-159  mg/dL   Borderline  [id number]  mg/dL   High  >914>190     mg/dL   Very High   TSH     Status: None   Collection Time: 03/11/16  7:15 AM  Result Value Ref Range   TSH 1.133 0.350 - 4.500 uIU/mL    Comment: Performed by a 3rd Generation assay with a functional sensitivity of <=0.01 uIU/mL.    Blood Alcohol level:  Lab Results  Component Value Date   ETH <5 03/08/2016    Metabolic Disorder Labs:  Lab Results  Component Value Date   HGBA1C 5.4 12/18/2014   No results found for: PROLACTIN Lab Results  Component Value Date   CHOL 144 03/11/2016   TRIG 93 03/11/2016   HDL 34 (L) 03/11/2016   CHOLHDL 4.2 03/11/2016   VLDL 19 03/11/2016   LDLCALC 91 03/11/2016    Current Medications: Current Facility-Administered Medications  Medication Dose Route Frequency Provider Last Rate Last Dose  . levETIRAcetam (KEPPRA) tablet 500 mg  500 mg Oral BID Jimmy FootmanAndrea Hernandez-Gonzalez, MD   500 mg at 03/11/16 0911  . magnesium hydroxide (MILK OF MAGNESIA) suspension 30 mL  30 mL Oral Daily PRN Audery AmelJohn T Clapacs, MD      . traZODone (DESYREL) tablet 100 mg  100 mg Oral QHS PRN Audery AmelJohn T Clapacs, MD   100 mg at 03/11/16 0051    PTA Medications: Prescriptions Prior to Admission  Medication Sig Dispense Refill Last Dose  . albuterol (PROVENTIL HFA;VENTOLIN HFA) 108 (90 BASE) MCG/ACT inhaler Inhale 2 puffs into the lungs every 6 (six) hours as needed for wheezing or shortness of breath.   prn at prn  . levETIRAcetam (KEPPRA) 500 MG tablet Take 1 tablet (500 mg total) by mouth 2 (two) times daily. 60 tablet 6 03/08/2016 at 0800    Musculoskeletal: Strength & Muscle Tone: within normal limits Gait & Station: normal Patient leans: N/A  Psychiatric Specialty Exam: I reviewed physical exam performed on medical floor and agree with the findings. Physical Exam  Nursing note and vitals reviewed.   Review of Systems  Psychiatric/Behavioral: Positive for depression.  All other systems reviewed and are negative.   Blood pressure 109/61, pulse 81, temperature 98.5 F (36.9 C), temperature source Oral, resp. rate 18, height 5\' 3"  (1.6 m), weight 56.7 kg (125 lb), last menstrual period 02/29/2016, SpO2 100 %, unknown if currently breastfeeding.Body mass index is 22.14 kg/m.  See SRA.                                                      Treatment Plan Summary: Daily contact with patient to assess and evaluate symptoms and progress in treatment and Medication management   Veronica Brooks is a 21 year old female with a history of untreated depression admitted after suicide attempt by Tylenol overdose in the context of severe social stressors.  1. Suicidal ideation. The patient adamantly denies any thoughts, intentions, or plans to herself or others. She is forward thinking and optimistic about the future. She is a loving mother and granddaughter.  2. Mood. We will start Zoloft for depression.   3. UTI. She was treated on the medical floor.  4. Tylenol overdose. She was treated with affect (pain on the medical floor Tylenol level is less than 10.  5. Insomnia. Trazodone is available.  6. Seizure  disorder. She is on Keppra.  7. Disposition. She will be discharged to her grandmother's place. She will follow up with [facility].   Observation Level/Precautions:  15 minute checks  Laboratory:  CBC Chemistry Profile UDS UA  Psychotherapy:    Medications:    Consultations:    Discharge Concerns:    Estimated LOS:  Other:     Physician Treatment Plan for Primary Diagnosis: Severe recurrent major depression without psychotic features (HCC) Long Term Goal(s): Improvement in symptoms so as ready for discharge  Short Term Goals: Ability to identify changes in lifestyle to reduce recurrence of condition will improve, Ability to verbalize feelings will improve, Ability to disclose and discuss suicidal ideas, Ability to demonstrate self-control will improve, Ability to identify and develop effective coping behaviors will improve, Ability to maintain clinical measurements within normal limits will improve, Compliance with prescribed medications will improve and Ability to identify triggers associated with substance abuse/mental health issues will improve  Physician Treatment Plan for Secondary Diagnosis: Principal Problem:   Severe recurrent major depression without psychotic features (HCC) Active Problems:   Seizure disorder (HCC)   UTI (urinary tract infection)   Acetaminophen overdose, intentional self-harm, initial encounter (HCC)  Long Term Goal(s): NA  Short Term Goals: NA  I certify that inpatient services furnished can reasonably be expected to improve the patient's condition.    Kristine Linea, MD 1/10/201811:03 AM

## 2016-03-11 NOTE — [facility] Counselor (Signed)
Patient is discharging within 24 hours of admission. Psychosocial assessment by CSW is not required. Patient will follow-up with [facility] for outpatient services including medication management and outpatient therapy.  Christen Wardrop G. Garnette CzechSampson MSW, LCSWA 03/11/2016 11:09 AM

## 2016-03-11 NOTE — [facility] Suicide Risk Assessment (Signed)
[facility] Admission Suicide Risk Assessment   Nursing information obtained from:  Patient Demographic factors:  Adolescent or young adult Current Mental Status:  NA Loss Factors:  Financial problems / change in socioeconomic status Historical Factors:  Family history of mental illness or substance abuse Risk Reduction Factors:  Living with another person, especially a relative, Responsible for children under 22 years of age, Sense of responsibility to family  Total Time spent with patient: 1 hour Principal Problem: Severe recurrent major depression without psychotic features (HCC) Diagnosis:   Patient Active Problem List   Diagnosis Date Noted  . Severe recurrent major depression without psychotic features (HCC) [F33.2] 03/10/2016  . Acetaminophen overdose, intentional self-harm, initial encounter (HCC) [T39.1X2A] 03/08/2016  . Depression [F32.9] 03/08/2016  . Suicidal behavior [R46.89] 03/08/2016  . Tylenol toxicity [T39.1X1A] 03/08/2016  . Seizure disorder (HCC) [G40.909] 12/19/2014  . Hypotension [I95.9] 12/19/2014  . Hypokalemia [E87.6] 12/19/2014  . UTI (urinary tract infection) [N39.0] 12/19/2014  . Postictal headache [G44.89] 12/19/2014  . Headache [R51] 12/19/2014  . Pregnancy [Z34.90] 03/23/2014  . UTI in pregnancy [O23.40] 03/21/2014  . Suspected macroscopic fetus [O36.60X0] 03/21/2014  . LGA (large for gestational age) fetus affecting management of mother [O36.60X0] 03/21/2014  . 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 Fetal macrosomia [O36.60X0]   . [redacted] weeks gestation of pregnancy [Z3A.38]   . High risk teen pregnancy in third trimester [O09.893] 02/14/2014  . [redacted] weeks gestation of pregnancy [Z3A.31]   . Late prenatal care [O09.30] 01/17/2014   Subjective Data: Overdose.  Continued Clinical Symptoms:  Alcohol Use Disorder Identification Test Final Score (AUDIT): 0 The "Alcohol Use Disorders Identification Test", Guidelines for Use in Primary Care, Second Edition.  World  Science writer [facility]). Score between 0-7:  no or low risk or alcohol related problems. Score between 8-15:  moderate risk of alcohol related problems. Score between 16-19:  high risk of alcohol related problems. Score 20 or above:  warrants further diagnostic evaluation for alcohol dependence and treatment.   CLINICAL FACTORS:   Depression:   Impulsivity Epilepsy   Musculoskeletal: Strength & Muscle Tone: within normal limits Gait & Station: normal Patient leans: N/A  Psychiatric Specialty Exam: Physical Exam  Nursing note and vitals reviewed.   Review of Systems  Psychiatric/Behavioral: Positive for depression.  All other systems reviewed and are negative.   Blood pressure 109/61, pulse 81, temperature 98.5 F (36.9 C), temperature source Oral, resp. rate 18, height 5\' 3"  (1.6 m), weight 56.7 kg (125 lb), last menstrual period 02/29/2016, SpO2 100 %, unknown if currently breastfeeding.Body mass index is 22.14 kg/m.  General Appearance: Casual  Eye Contact:  Good  Speech:  Clear and Coherent  Volume:  Normal  Mood:  Euthymic  Affect:  Appropriate  Thought Process:  Goal Directed and Descriptions of Associations: Intact  Orientation:  Full (Time, Place, and Person)  Thought Content:  WDL  Suicidal Thoughts:  No  Homicidal Thoughts:  No  Memory:  Immediate;   Fair Recent;   Fair Remote;   Fair  Judgement:  Impaired  Insight:  Present  Psychomotor Activity:  Normal  Concentration:  Concentration: Fair and Attention Span: Fair  Recall:  [employer] of Knowledge:  Fair  Language:  Fair  Akathisia:  No  Handed:  Right  AIMS (if indicated):     Assets:  Communication Skills Desire for Improvement Financial Resources/Insurance Housing Physical Health Resilience Social Support  ADL's:  Intact  Cognition:  WNL  Sleep:  COGNITIVE FEATURES THAT CONTRIBUTE TO RISK:  None    SUICIDE RISK:   Minimal: No identifiable suicidal ideation.  Patients  presenting with no risk factors but with morbid ruminations; may be classified as minimal risk based on the severity of the depressive symptoms   PLAN OF CARE: Hospital admission, medication management, discharge planning.  Ms. Early CharsSiler is a 21 year old female with a history of untreated depression admitted after suicide attempt by Tylenol overdose in the context of severe social stressors.  1. Suicidal ideation. The patient adamantly denies any thoughts, intentions, or plans to herself or others. She is forward thinking and optimistic about the future. She is a loving mother and granddaughter.  2. Mood. We will start Zoloft for depression.   3. UTI. She was treated on the medical floor.  4. Tylenol overdose. She was treated with affect (pain on the medical floor Tylenol level is less than 10.  5. Insomnia. Trazodone is available.  6. Seizure disorder. She is on Keppra.  7. Disposition. She will be discharge to her grandmother's place. She will follow up with [facility].    I certify that inpatient services furnished can reasonably be expected to improve the patient's condition.  Kristine LineaJolanta Pucilowska, MD 03/11/2016, 10:58 AM

## 2016-03-11 NOTE — Discharge Summary (Signed)
Physician Discharge Summary Note  Patient:  Veronica Brooks is an 21 y.o., female MRN:  [id number] DOB:  01/15/1996 Patient phone:  [phone] (home)  Patient address:   [address] [place] [address],  Total Time spent with patient: 1 hour  Date of Admission:  03/10/2016 Date of Discharge: 03/11/2016  Reason for Admission:  Overdose.  Identifying data. Ms. Lofland is a 21 year old female with a history of untreated depression.  Chief complaint. "This was a big mistake."  History of present illness. Information was obtained from the patient and the chart. The patient reports symptoms of depression since she was 21 years old. She never received any treatment for depression. In the past few weeks she has been under considerable stress. She was trying to obtain an independent apartment but it turned out to be impossible. She started getting increasingly depressed with poor sleep, decreased appetite, anhedonia, feeling of guilt and hopelessness worthlessness, poor energy and concentration, social isolation crying spells. She denies having suicidal ideation or planning her overdose. On Sunday she received another disappointing news and took an overdose of Tylenol. Immediately after she called her mother and came to the hospital. She was briefly hospitalized on medical floor for treatment of Tylenol overdose. She was transferred to psychiatry for further treatment. The patient denies any psychotic symptoms or symptoms suggestive of bipolar mania. She reports one anxiety attack at the time when she was taking Tylenol but no panic disorder symptoms. She denies PTSD or OCD symptoms as well. She does not use alcohol or illicit substances.  Past psychiatric history. She was never hospitalized or attempted suicide before. She was never treated with medications or therapy.  Family psychiatric history. Her aunt was diagnosed with depression after the diagnosis of cancer.  Social history. She  did not graduate from high school as she has her first baby at the age of 41. She lives with her grandmother and great grandmother who has Alzheimer's. She takes care of her two children ages 69 and 2. She has Medicaid and receives food stamps. She is unemployed as there is no transportation. At the patient reports another major stressor in her life. Her father who has been in prison for most of her life is being released from prison in 3 weeks' time. The patient believes that in her life she is not in a place that would get her father's approval.  Principal Problem: Severe recurrent major depression without psychotic features [facility]) Discharge Diagnoses: Patient Active Problem List   Diagnosis Date Noted  . Severe recurrent major depression without psychotic features (HCC) [F33.2] 03/10/2016  . Acetaminophen overdose, intentional self-harm, initial encounter (HCC) [T39.1X2A] 03/08/2016  . Depression [F32.9] 03/08/2016  . Suicidal behavior [R46.89] 03/08/2016  . Tylenol toxicity [T39.1X1A] 03/08/2016  . Seizure disorder (HCC) [G40.909] 12/19/2014  . Hypotension [I95.9] 12/19/2014  . Hypokalemia [E87.6] 12/19/2014  . UTI (urinary tract infection) [N39.0] 12/19/2014  . Postictal headache [G44.89] 12/19/2014  . Headache [R51] 12/19/2014  . Pregnancy [Z34.90] 03/23/2014  . UTI in pregnancy [O23.40] 03/21/2014  . Suspected macroscopic fetus [O36.60X0] 03/21/2014  . LGA (large for gestational age) fetus affecting management of mother [O36.60X0] 03/21/2014  . 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 Fetal macrosomia [O36.60X0]   . [redacted] weeks gestation of pregnancy [Z3A.38]   . High risk teen pregnancy in third trimester [O09.893] 02/14/2014  . [redacted] weeks gestation of pregnancy [Z3A.31]   . Late prenatal care [O09.30] 01/17/2014    Past Medical History:  Past Medical History:  Diagnosis Date  . Bronchitis   . Depression   . Heart murmur    Does not require cardiologist  . Medical history  non-contributory     Past Surgical History:  Procedure Laterality Date  . NO PAST SURGERIES     Family History:  Family History  Problem Relation Age of Onset  . Diabetes Maternal Grandmother   . Cancer Maternal Grandfather    Social History:  History  Alcohol Use No     History  Drug Use No    Social History   Social History  . Marital status: Single    Spouse name: N/A  . Number of children: N/A  . Years of education: N/A   Social History Main Topics  . Smoking status: Current Some Day Smoker    Types: Cigarettes  . Smokeless tobacco: Never Used  . Alcohol use No  . Drug use: No  . Sexual activity: Yes    Birth control/ protection: None   Other Topics Concern  . None   Social History Narrative  . None    Hospital Course:    Ms. Cleland is a 21 year old female with a history of untreated depression admitted after suicide attempt by Tylenol overdose in the context of severe social stressors.  1. Suicidal ideation. The patient adamantly denies any thoughts, intentions, or plans to herself or others. She is forward thinking and optimistic about the future. She is a loving mother and granddaughter.  2. Mood. We started Zoloft for depression.   3. UTI. She was given a dose of Fosfomycin.   4. Tylenol overdose. She was treated with acetylcysteine infusion on the medical floor. Tylenol level is less than 10.  5. Insomnia. Trazodone was available.  6. Seizure disorder. She is on Keppra.  7. Disposition. She was discharged to her grandmother's place. She will follow up with [facility].  Physical Findings: AIMS: Facial and Oral Movements Muscles of Facial Expression: None, normal Lips and Perioral Area: None, normal Jaw: None, normal Tongue: None, normal,Extremity Movements Upper (arms, wrists, hands, fingers): None, normal Lower (legs, knees, ankles, toes): None, normal, Trunk Movements Neck, shoulders, hips: None, normal, Overall Severity Severity of  abnormal movements (highest score from questions above): None, normal Incapacitation due to abnormal movements: None, normal Patient's awareness of abnormal movements (rate only patient's report): No Awareness, Dental Status Current problems with teeth and/or dentures?: No Does patient usually wear dentures?: No  CIWA:    COWS:     Musculoskeletal: Strength & Muscle Tone: within normal limits Gait & Station: normal Patient leans: N/A  Psychiatric Specialty Exam: Physical Exam  Nursing note and vitals reviewed.   Review of Systems  Neurological: Positive for seizures.  Psychiatric/Behavioral: Positive for depression.  All other systems reviewed and are negative.   Blood pressure 109/61, pulse 81, temperature 98.5 F (36.9 C), temperature source Oral, resp. rate 18, height 5\' 3"  (1.6 m), weight 56.7 kg (125 lb), last menstrual period 02/29/2016, SpO2 100 %, unknown if currently breastfeeding.Body mass index is 22.14 kg/m.  General Appearance: Casual  Eye Contact:  Good  Speech:  Clear and Coherent  Volume:  Normal  Mood:  Euthymic  Affect:  Appropriate  Thought Process:  Goal Directed and Descriptions of Associations: Intact  Orientation:  Full (Time, Place, and Person)  Thought Content:  WDL  Suicidal Thoughts:  No  Homicidal Thoughts:  No  Memory:  Immediate;   Fair Recent;   Fair Remote;   Fair  Judgement:  Impaired  Insight:  Present  Psychomotor Activity:  Normal  Concentration:  Concentration: Fair and Attention Span: Fair  Recall:  F[employer]Fair  Fund of Knowledge:  Fair  Language:  Fair  Akathisia:  No  Handed:  Right  AIMS (if indicated):     Assets:  Communication Skills Desire for Improvement Financial Resources/Insurance Housing Physical Health Resilience Social Support  ADL's:  Intact  Cognition:  WNL  Sleep:        Have you used any form of tobacco in the last 30 days? (Cigarettes, Smokeless Tobacco, Cigars, and/or Pipes): Yes  Has this patient used  any form of tobacco in the last 30 days? (Cigarettes, Smokeless Tobacco, Cigars, and/or Pipes) Yes, Yes, A prescription for an FDA-approved tobacco cessation medication was offered at discharge and the patient refused  Blood Alcohol level:  Lab Results  Component Value Date   [facility] <5 03/08/2016    Metabolic Disorder Labs:  Lab Results  Component Value Date   HGBA1C 5.4 12/18/2014   No results found for: PROLACTIN Lab Results  Component Value Date   CHOL 144 03/11/2016   TRIG 93 03/11/2016   HDL 34 (L) 03/11/2016   CHOLHDL 4.2 03/11/2016   VLDL 19 03/11/2016   LDLCALC 91 03/11/2016    See Psychiatric Specialty Exam and Suicide Risk Assessment completed by Attending Physician prior to discharge.  Discharge destination:  Home  Is patient on multiple antipsychotic therapies at discharge:  No   Has Patient had three or more failed trials of antipsychotic monotherapy by history:  No  Recommended Plan for Multiple Antipsychotic Therapies: NA  Discharge Instructions    Diet - low sodium heart healthy    Complete by:  As directed    Increase activity slowly    Complete by:  As directed      Allergies as of 03/11/2016      Reactions   Latex Rash, Other (See Comments)   Reaction:  Burning       Medication List    TAKE these medications     Indication  albuterol 108 (90 Base) MCG/ACT inhaler Commonly known as:  PROVENTIL HFA;VENTOLIN HFA Inhale 2 puffs into the lungs every 6 (six) hours as needed for wheezing or shortness of breath.  Indication:  Asthma   levETIRAcetam 500 MG tablet Commonly known as:  KEPPRA Take 1 tablet (500 mg total) by mouth 2 (two) times daily.  Indication:  Muscular Spasm or Twitch occurring with Seizures   sertraline 50 MG tablet Commonly known as:  ZOLOFT Take 1 tablet (50 mg total) by mouth daily.  Indication:  Major Depressive Disorder      Follow-up Information    [facility]. Go on 03/18/2016.   Why:  Follow-up at 7:15am  with [facility] for outpatient services including medication management. Please bring photo I.D, insurance info,current meds & discharge summary to first appointment. Contact harvey bryant at [phone] if any additional questions. Contact information: [address] [place] [address] [phone]           Follow-up recommendations:  Activity:  As tolerated. Diet:  Regular. Other:  Keep follow-up appointments.  Comments:    Signed: Kristine LineaJolanta Kirstin Kugler, MD 03/11/2016, 11:24 AM

## 2016-03-11 NOTE — [facility] Suicide Risk Assessment (Signed)
[facility]   Principal Problem: Severe recurrent major depression without psychotic features [facility](HCC) Discharge Diagnoses:  Patient Active Problem List   Diagnosis Date Noted  . Severe recurrent major depression without psychotic features (HCC) [F33.2] 03/10/2016  . Acetaminophen overdose, intentional self-harm, initial encounter (HCC) [T39.1X2A] 03/08/2016  . Depression [F32.9] 03/08/2016  . Suicidal behavior [R46.89] 03/08/2016  . Tylenol toxicity [T39.1X1A] 03/08/2016  . Seizure disorder (HCC) [G40.909] 12/19/2014  . Hypotension [I95.9] 12/19/2014  . Hypokalemia [E87.6] 12/19/2014  . UTI (urinary tract infection) [N39.0] 12/19/2014  . Postictal headache [G44.89] 12/19/2014  . Headache [R51] 12/19/2014  . Pregnancy [Z34.90] 03/23/2014  . UTI in pregnancy [O23.40] 03/21/2014  . Suspected macroscopic fetus [O36.60X0] 03/21/2014  . LGA (large for gestational age) fetus affecting management of mother [O36.60X0] 03/21/2014  . 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 Fetal macrosomia [O36.60X0]   . [redacted] weeks gestation of pregnancy [Z3A.38]   . High risk teen pregnancy in third trimester [O09.893] 02/14/2014  . [redacted] weeks gestation of pregnancy [Z3A.31]   . Late prenatal care [O09.30] 01/17/2014    Total Time spent with patient: 1 hour  Musculoskeletal: Strength & Muscle Tone: within normal limits Gait & Station: normal Patient leans: N/A  Psychiatric Specialty Exam: Review of Systems  Psychiatric/Behavioral: Positive for depression.  All other systems reviewed and are negative.   Blood pressure 109/61, pulse 81, temperature 98.5 F (36.9 C), temperature source Oral, resp. rate 18, height 5\' 3"  (1.6 m), weight 56.7 kg (125 lb), last menstrual period 02/29/2016, SpO2 100 %, unknown if currently breastfeeding.Body mass index is 22.14 kg/m.  General Appearance: Casual  Eye Contact::  Good  Speech:  Clear and Coherent409  Volume:  Normal  Mood:  Euthymic   Affect:  Appropriate  Thought Process:  Goal Directed and Descriptions of Associations: Intact  Orientation:  Full (Time, Place, and Person)  Thought Content:  WDL  Suicidal Thoughts:  No  Homicidal Thoughts:  No  Memory:  Immediate;   Fair Recent;   Fair Remote;   Fair  Judgement:  Impaired  Insight:  Present  Psychomotor Activity:  Normal  Concentration:  Fair  Recall:  FiservFair  Fund of Knowledge:Fair  Language: Fair  Akathisia:  No  Handed:  Right  AIMS (if indicated):     Assets:  Communication Skills Desire for Improvement Financial Resources/Insurance Housing Physical Health Resilience Social Support  Sleep:     Cognition: WNL  ADL's:  Intact   Mental Status Per Nursing Assessment::   On Admission:  NA  Demographic Factors:  Adolescent or young adult, Low socioeconomic status and Unemployed  Loss Factors: Financial problems/change in socioeconomic status  Historical Factors: Family history of mental illness or substance abuse and Impulsivity  Risk Reduction Factors:   Responsible for children under 22 years of age, Sense of responsibility to family, Living with another person, especially a relative and Positive social support  Continued Clinical Symptoms:  Depression:   Impulsivity Epilepsy  Cognitive Features That Contribute To Risk:  None    Suicide Risk:  Minimal: No identifiable suicidal ideation.  Patients presenting with no risk factors but with morbid ruminations; may be classified as minimal risk based on the severity of the depressive symptoms  Follow-up Information    [facility]. Go on 03/18/2016.   Why:  Follow-up at 7:15am with [facility] for outpatient services including medication management. Please bring photo I.D, insurance info,current meds & discharge summary to first appointment. Contact harvey  bryant at [phone] if any additional questions. Contact information: [address] [place] [address] [phone]            Plan Of Care/Follow-up recommendations:  Activity:  As tolerated. Diet:  Regular. Other:  Keep follow-up appointments.  Kristine Linea, MD 03/11/2016, 11:20 AM

## 2016-03-11 NOTE — Progress Notes (Signed)
  [facility] :  Will you be returning to the same living situation after discharge:  Yes,  home with mother At discharge, do you have transportation home?: Yes,  mother's boyfriend Do you have the ability to pay for your medications: Yes,  patient has insurance  Release of information consent forms completed and in the chart;  Patient's signature needed at discharge.  Patient to Follow up at: Follow-up Information    [facility].   Why:  Follow-up at 7:15am with [facility] for outpatient services including medication management. Please bring photo I.D, insurance info,current meds & discharge summary to first appointment. Contact harvey bryant at [phone] if any additional questions. Contact information: [address] [place] [address] [phone]           Next level of care provider has access to [facility] Link:no  Safety Planning and Suicide Prevention discussed: Yes,  with patient and with mother  Have you used any form of tobacco in the last 30 days? (Cigarettes, Smokeless Tobacco, Cigars, and/or Pipes): Yes  Has patient been referred to the Quitline?: Patient refused referral  Patient has been referred for addiction treatment: Yes  Hymie Gorr G. Garnette CzechSampson MSW, LCSWA 03/11/2016, 11:26 AM

## 2016-03-11 NOTE — [facility] Suicide Risk Assessment (Signed)
[facility] INPATIENT:  Family/Significant Other Suicide Prevention Education  Suicide Prevention Education:  Education Completed;Veronica Brooks(mother [phone]), has been identified by the patient as the family member/significant other with whom the patient will be residing, and identified as the person(s) who will aid the patient in the event of a mental health crisis (suicidal ideations/suicide attempt).  With written consent from the patient, the family member/significant other has been provided the following suicide prevention education, prior to the and/or following the discharge of the patient. Mother states patient will be staying with her when she discharges.   The suicide prevention education provided includes the following:  Suicide risk factors  Suicide prevention and interventions  National Suicide Hotline telephone number  [facility] assessment telephone number  [facility] Emergency Assistance 911  [facility] and/or Residential Mobile Crisis Unit telephone number  Request made of family/significant other to:  Remove weapons (e.g., guns, rifles, knives), all items previously/currently identified as safety concern. Mother confirms that patient has no access to guns or weapons.    Remove drugs/medications (over-the-counter, prescriptions, illicit drugs), all items previously/currently identified as a safety concern.  The family member/significant other verbalizes understanding of the suicide prevention education information provided.  The family member/significant other agrees to remove the items of safety concern listed above.  Veronica Maffei G. Garnette CzechSampson MSW, LCSWA 03/11/2016, 11:25 AM

## 2016-03-11 NOTE — Progress Notes (Signed)
D:Patient aware of discharge this shift . Patient returning home . Patient received all belonging locked up . Patient denies  Suicidal  And homicidal ideations  .  A: Writer instructed on discharge criteria  . Informed Discharge Summary Transitional Record Suicidal Assessment and prescriptions  given to patient f . Aware  Of follow up appointment . R: Patient left unit with no questions  Or concerns  With family

## 2016-03-11 NOTE — Progress Notes (Signed)
Recreation Therapy Notes  Date: 01.10.19 Time: 9:30 am Location: Craft Room  Group Topic: Self-esteem  Goal Area(s) Addresses:  Patient will write at least one positive trait about self. Patient will verbalize benefit of having a healthy self-esteem.  Behavioral Response: Did not attend  Intervention: I Am  Activity: Patients were given a worksheet with the letter I on it and were instructed to write as many positive traits about themselves inside the letter.  Education: LRT educated patients on ways to increase their self-esteem.  Education Outcome: Patient did not attend group.   Clinical Observations/Feedback: Patient did not attend group.  Ashle Stief M, LRT/CTRS 03/11/2016 10:13 AM 

## 2016-03-11 NOTE — Progress Notes (Signed)
Patient admitted to unit after intentional overdose on acetaminophen.  Patient states she is stressed about her finances and living situation.  Patient calm and cooperative.  Patient currently denies SI.  Patient oriented to unit.  No distress noted. Patient search performed.  No contraband found.

## 2016-03-12 LAB — HEMOGLOBIN A1C
Hgb A1c MFr Bld: 5.5 % (ref 4.8–5.6)
Mean Plasma Glucose: 111 mg/dL

## 2016-12-16 IMAGING — CT CT HEAD W/O CM
1 series · 16 of 26 positions shown, 20 images · non-contrast
Comparison: None.

CLINICAL DATA: 18-year-old female with acute seizure.

EXAM:
CT HEAD WITHOUT CONTRAST
TECHNIQUE: Contiguous axial images were obtained from the base of the skull
through the vertex without intravenous contrast.

[Series 2: soft tissue · axial · 0.35mm/px · z∈[-140,-26]mm · 16 of 26 slices shown, 20 images]
[im 2/26  brain]
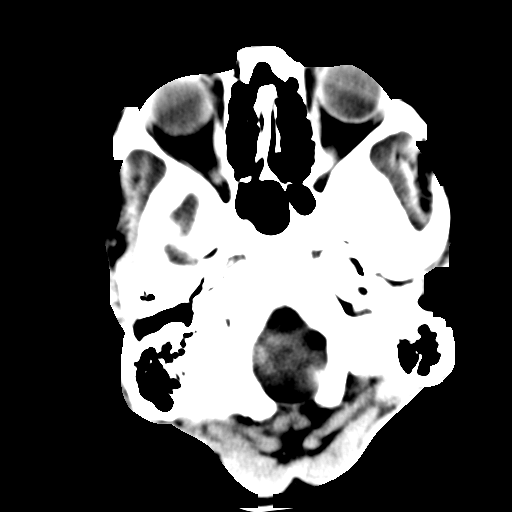
[im 2/26  bone]
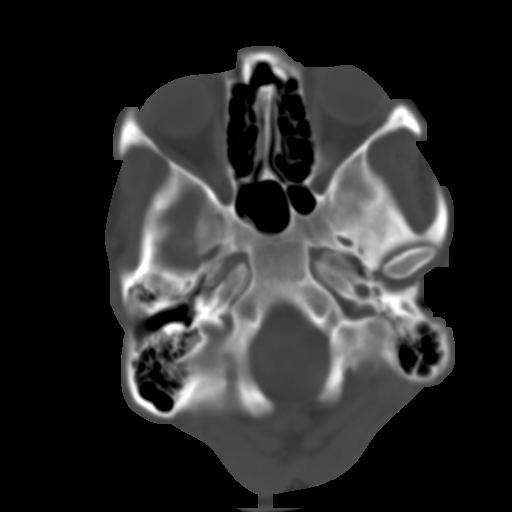
[im 4/26  brain]
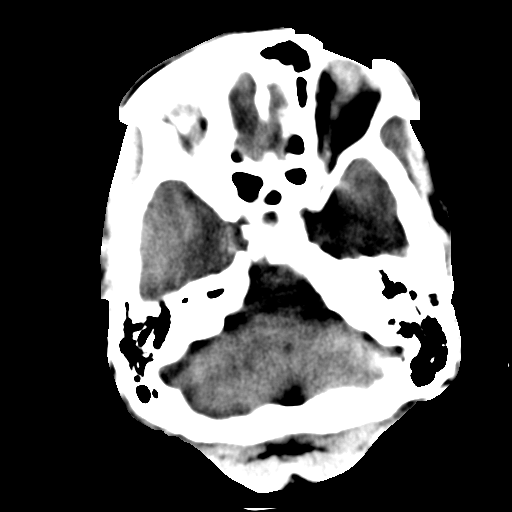
[im 5/26  brain]
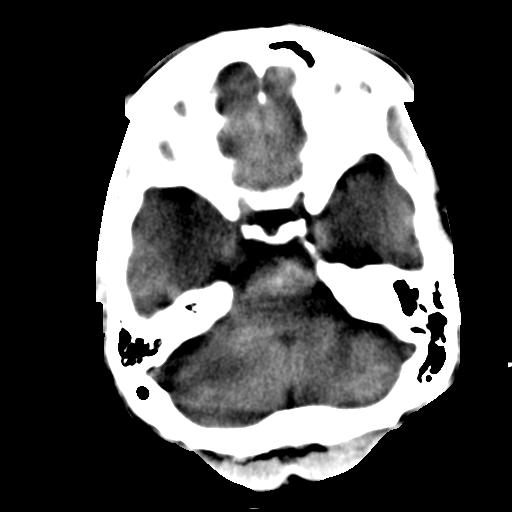
[im 7/26  brain]
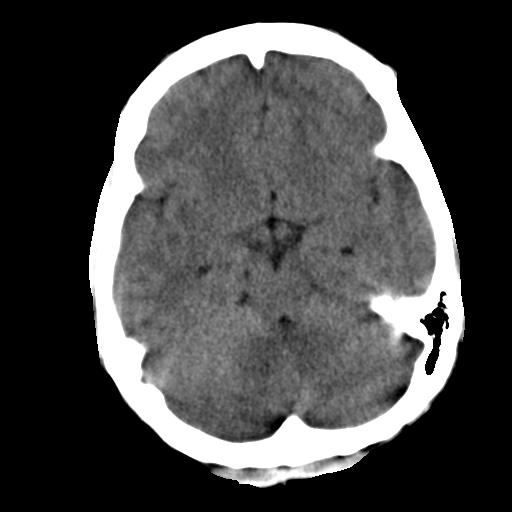
[im 8/26  brain]
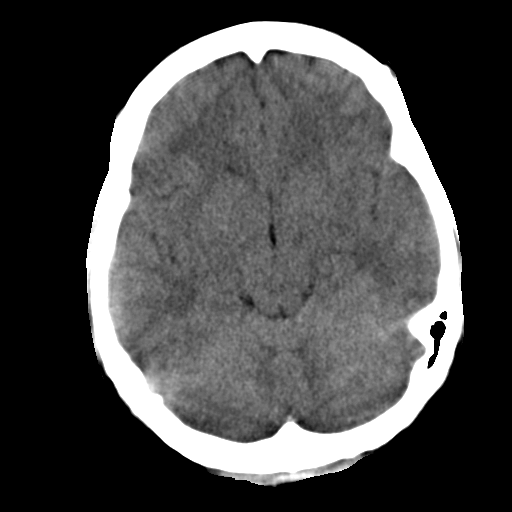
[im 8/26  bone]
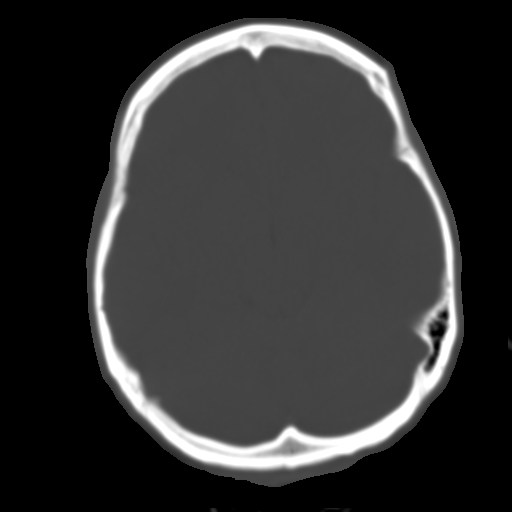
[im 10/26  brain]
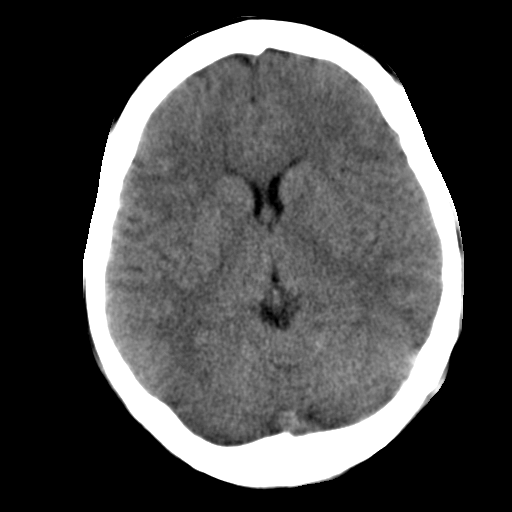
[im 11/26  brain]
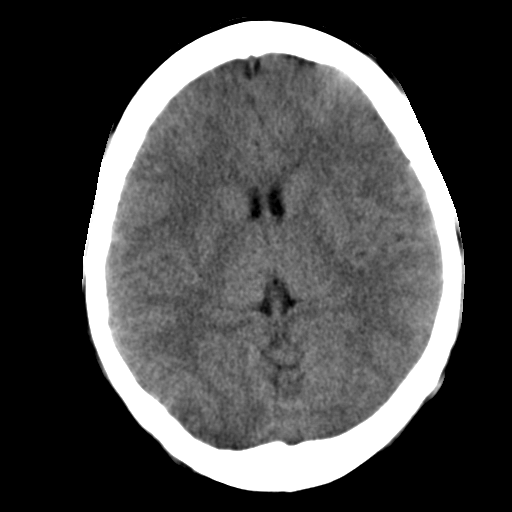
[im 13/26  brain]
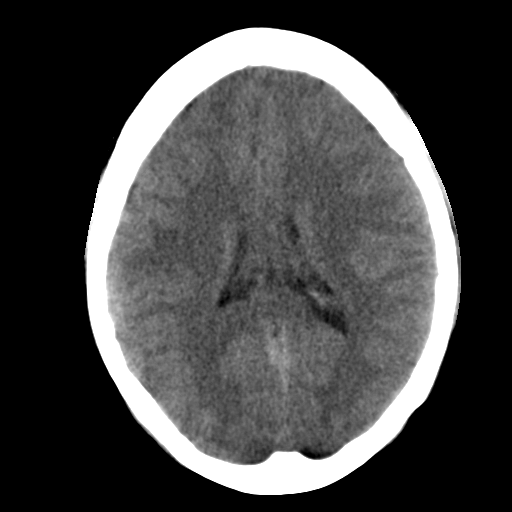
[im 14/26  brain]
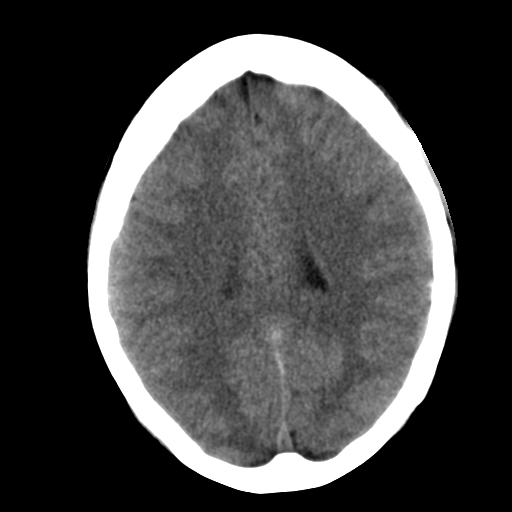
[im 14/26  bone]
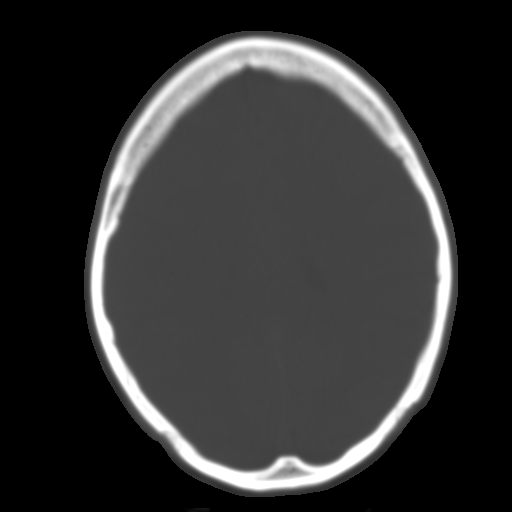
[im 16/26  brain]
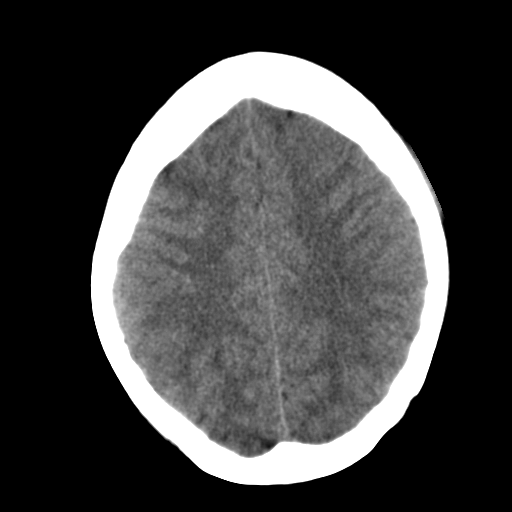
[im 17/26  brain]
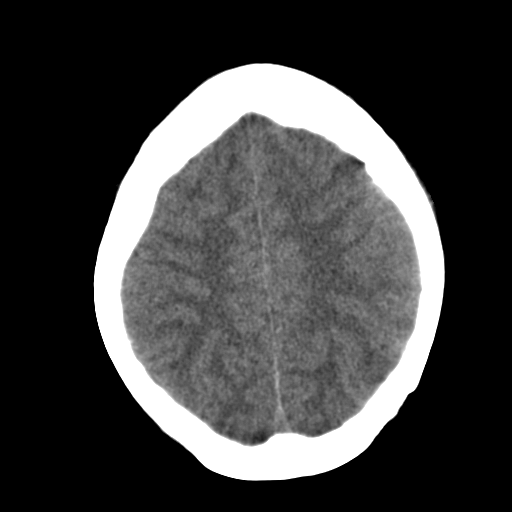
[im 19/26  brain]
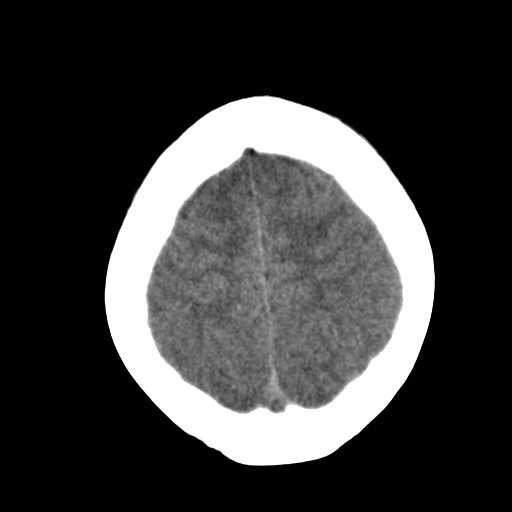
[im 20/26  brain]
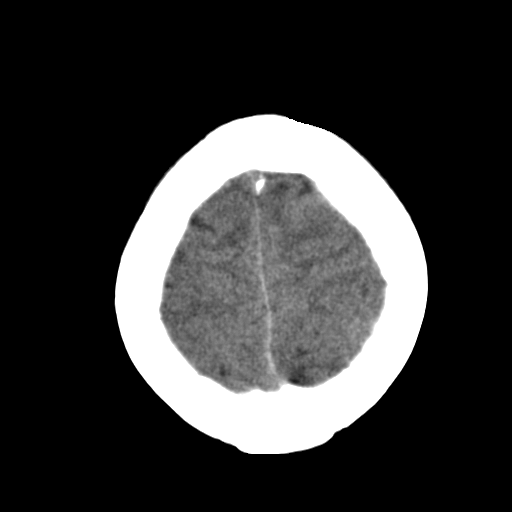
[im 20/26  bone]
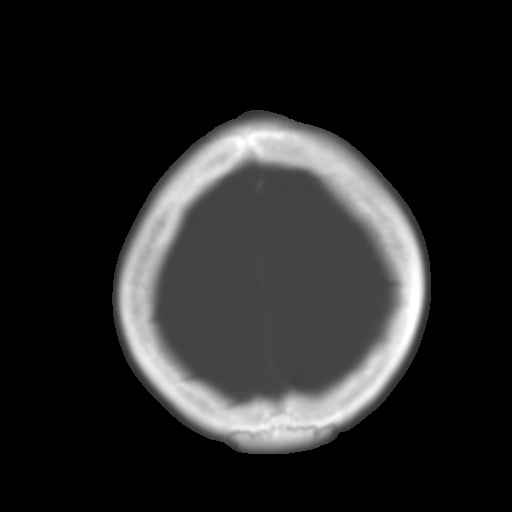
[im 22/26  brain]
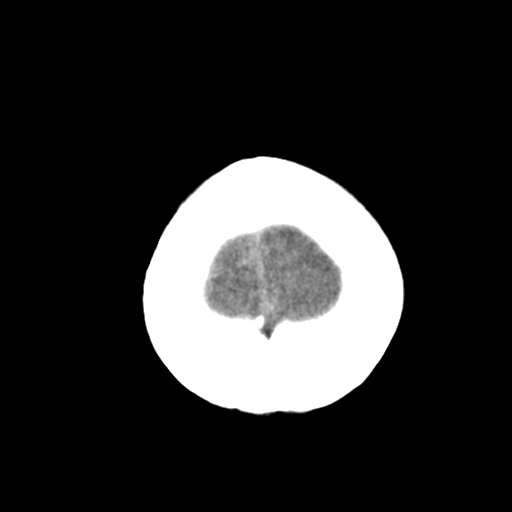
[im 23/26  brain]
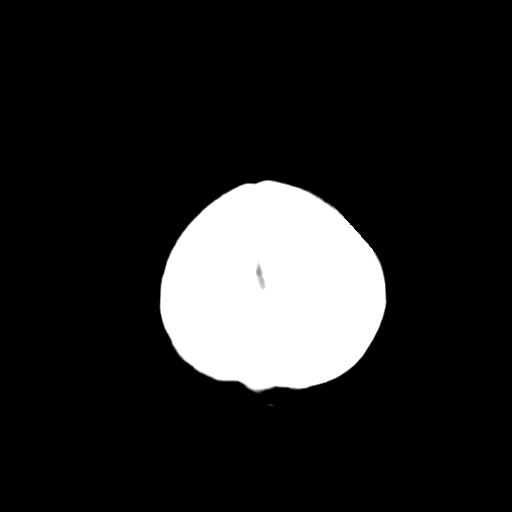
[im 25/26  brain]
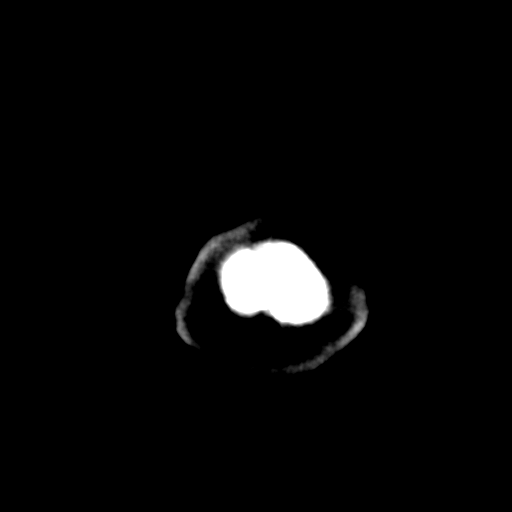

[16 of 26 positions shown; findings below may reference images not displayed]

FINDINGS: No intracranial abnormalities are identified, including mass lesion
or mass effect, hydrocephalus, extra-axial fluid collection, midline
shift, hemorrhage, or acute infarction.

The visualized bony calvarium is unremarkable.
IMPRESSION: Unremarkable noncontrast head CT.

## 2017-03-02 NOTE — L&D Delivery Note (Signed)
Patient: Veronica GoreDeja Brooks MRN: [id number]  GBS status: positive, IAP given: PCNx2  Patient is a [age] y.o. now G3P3 s/p NSVD at 741w5d, who was admitted for IOL for NRNST. SROM 1h 2229m prior to delivery with clear fluid.   Head delivered LOA. Loose nuchal/body cord present, delivered through. Shoulder and body delivered in usual fashion. Infant with spontaneous cry, placed on mother's abdomen, dried and bulb suctioned. Cord clamped x 2 after 1-minute delay, and cut by family member. Cord blood drawn. Placenta delivered spontaneously with gentle cord traction. Fundus firm with massage and Pitocin. Perineum inspected and found to have no lacerations with good hemostasis noted.  Delivery Note At 1:54 PM a viable female was delivered via Vaginal, Spontaneous (Presentation: LOA; cephalic).  APGAR: 8, 9; weight  6lb 6.3oz.   Placenta status: intact, 3-vessel cord.  Cord:  with the following complications: nuchal/body x 1, loose.    Anesthesia: epidural Episiotomy: none  Lacerations:  none Suture Repair: none Est. Blood Loss (mL):  150ml  Mom to postpartum.  Baby to Couplet care / Skin to Skin.  Veronica AbbotNimeka Kendy Brooks 02/13/2018, 2:06 PM

## 2017-07-08 DIAGNOSIS — K920 Hematemesis: Secondary | ICD-10-CM | POA: Diagnosis present

## 2017-07-08 DIAGNOSIS — K226 Gastro-esophageal laceration-hemorrhage syndrome: Secondary | ICD-10-CM | POA: Insufficient documentation

## 2017-07-08 DIAGNOSIS — Z91040 Latex allergy status: Secondary | ICD-10-CM | POA: Diagnosis not present

## 2017-07-08 DIAGNOSIS — Z79899 Other long term (current) drug therapy: Secondary | ICD-10-CM | POA: Diagnosis not present

## 2017-07-08 DIAGNOSIS — Z3201 Encounter for pregnancy test, result positive: Secondary | ICD-10-CM | POA: Diagnosis not present

## 2017-07-08 DIAGNOSIS — O210 Mild hyperemesis gravidarum: Secondary | ICD-10-CM | POA: Insufficient documentation

## 2017-07-08 DIAGNOSIS — F17210 Nicotine dependence, cigarettes, uncomplicated: Secondary | ICD-10-CM | POA: Diagnosis not present

## 2017-07-08 LAB — TYPE AND SCREEN
ABO/RH(D): O POS
ANTIBODY SCREEN: NEGATIVE

## 2017-07-08 LAB — COMPREHENSIVE METABOLIC PANEL
ALT: 37 U/L (ref 14–54)
AST: 23 U/L (ref 15–41)
Albumin: 4 g/dL (ref 3.5–5.0)
Alkaline Phosphatase: 47 U/L (ref 38–126)
Anion gap: 8 (ref 5–15)
BILIRUBIN TOTAL: 0.3 mg/dL (ref 0.3–1.2)
BUN: 9 mg/dL (ref 6–20)
CHLORIDE: 102 mmol/L (ref 101–111)
CO2: 24 mmol/L (ref 22–32)
Calcium: 9.1 mg/dL (ref 8.9–10.3)
Creatinine, Ser: 0.47 mg/dL (ref 0.44–1.00)
Glucose, Bld: 100 mg/dL — ABNORMAL HIGH (ref 65–99)
POTASSIUM: 3.7 mmol/L (ref 3.5–5.1)
Sodium: 134 mmol/L — ABNORMAL LOW (ref 135–145)
TOTAL PROTEIN: 7.6 g/dL (ref 6.5–8.1)

## 2017-07-08 LAB — CBC
HEMATOCRIT: 36.4 % (ref 35.0–47.0)
Hemoglobin: 12.4 g/dL (ref 12.0–16.0)
MCH: 32.5 pg (ref 26.0–34.0)
MCHC: 34 g/dL (ref 32.0–36.0)
MCV: 95.6 fL (ref 80.0–100.0)
Platelets: 235 10*3/uL (ref 150–440)
RBC: 3.81 MIL/uL (ref 3.80–5.20)
RDW: 13 % (ref 11.5–14.5)
WBC: 11 10*3/uL (ref 3.6–11.0)

## 2017-07-08 LAB — LIPASE, BLOOD: LIPASE: 25 U/L (ref 11–51)

## 2017-07-08 NOTE — ED Triage Notes (Addendum)
Patient c/o N/V X 2 weeks. Patient reports 2 episodes of scant amount of blood in emesis beginning today.

## 2017-07-09 ENCOUNTER — Emergency Department
Admission: EM | Admit: 2017-07-09 | Discharge: 2017-07-09 | Disposition: A | Discharge status: Home / Self Care | Payer: Medicaid Other | Attending: Emergency Medicine | Admitting: Emergency Medicine

## 2017-07-09 DIAGNOSIS — K226 Gastro-esophageal laceration-hemorrhage syndrome: Secondary | ICD-10-CM

## 2017-07-09 DIAGNOSIS — O210 Mild hyperemesis gravidarum: Secondary | ICD-10-CM

## 2017-07-09 LAB — URINALYSIS, COMPLETE (UACMP) WITH MICROSCOPIC
Bilirubin Urine: NEGATIVE
GLUCOSE, UA: NEGATIVE mg/dL
Hgb urine dipstick: NEGATIVE
Ketones, ur: NEGATIVE mg/dL
Leukocytes, UA: NEGATIVE
NITRITE: NEGATIVE
Protein, ur: NEGATIVE mg/dL
SPECIFIC GRAVITY, URINE: 1.027 (ref 1.005–1.030)
pH: 6 (ref 5.0–8.0)

## 2017-07-09 LAB — POCT PREGNANCY, URINE: PREG TEST UR: POSITIVE — AB

## 2017-07-09 MED ORDER — FAMOTIDINE 20 MG PO TABS
20.0000 mg | ORAL_TABLET | Freq: Once | ORAL | Status: AC
  Administered 2017-07-09: 20 mg via ORAL
  Filled 2017-07-09: qty 1

## 2017-07-09 MED ORDER — FAMOTIDINE 40 MG PO TABS: 40.0000 mg | ORAL_TABLET | Freq: Every evening | ORAL | 0 refills | Status: DC

## 2017-07-09 MED ORDER — METOCLOPRAMIDE HCL 5 MG/ML IJ SOLN
10.0000 mg | Freq: Once | INTRAMUSCULAR | Status: AC
  Administered 2017-07-09: 10 mg via INTRAVENOUS
  Filled 2017-07-09: qty 2

## 2017-07-09 MED ORDER — METOCLOPRAMIDE HCL 10 MG PO TABS: 10.0000 mg | ORAL_TABLET | Freq: Three times a day (TID) | ORAL | 0 refills | Status: DC | PRN

## 2017-07-09 NOTE — ED Provider Notes (Signed)
[facility] Emergency Department Provider Note   ____________________________________________   First MD Initiated Contact with Patient 07/09/17 0117     (approximate)  I have reviewed the triage vital signs and the nursing notes.   HISTORY  Chief Complaint Hematemesis    HPI Osiris Insco is a 22 y.o. female who comes into the hospital today stating that she has been vomiting and having blood in her vomit.  She states that she has been vomiting intermittently for the past 2 weeks.  Her last menstrual period was the 12th and she does not believe that she is pregnant but she has not taken any tests.  The patient reports that the blood showed up in her emesis around 2 AM.  She states that it was at first just some streaks of blood but then at 5 PM she had a couple of clots in her emesis so she decided to come in to get checked out.  The patient denies any abdominal pain and states that she feels like something is stuck in her throat.  The patient has some throat discomfort.  She denies any headaches, blurred vision, back pain, chest pain, shortness of breath, abdominal pain.  She states that she is able to keep liquids down.  She is here today for evaluation.   Past Medical History:  Diagnosis Date  . Bronchitis   . Depression   . Heart murmur    Does not require cardiologist  . Medical history non-contributory     Patient Active Problem List   Diagnosis Date Noted  . Severe recurrent major depression without psychotic features (HCC) 03/10/2016  . Acetaminophen overdose, intentional self-harm, initial encounter (HCC) 03/08/2016  . Depression 03/08/2016  . Suicidal behavior 03/08/2016  . Tylenol toxicity 03/08/2016  . Seizure disorder (HCC) 12/19/2014  . Hypotension 12/19/2014  . Hypokalemia 12/19/2014  . UTI (urinary tract infection) 12/19/2014  . Postictal headache 12/19/2014  . Headache 12/19/2014  . Pregnancy 03/23/2014  . UTI in pregnancy  03/21/2014  . Suspected macroscopic fetus 03/21/2014  . LGA (large for gestational age) fetus affecting management of mother 03/21/2014  . Fetal renal anomaly   . Fetal macrosomia   . [redacted] weeks gestation of pregnancy   . High risk teen pregnancy in third trimester 02/14/2014  . [redacted] weeks gestation of pregnancy   . Late prenatal care 01/17/2014    Past Surgical History:  Procedure Laterality Date  . NO PAST SURGERIES      Prior to Admission medications   Medication Sig Start Date End Date Taking? Authorizing Provider  albuterol (PROVENTIL HFA;VENTOLIN HFA) 108 (90 BASE) MCG/ACT inhaler Inhale 2 puffs into the lungs every 6 (six) hours as needed for wheezing or shortness of breath.    [provider]  famotidine (PEPCID) 40 MG tablet Take 1 tablet (40 mg total) by mouth every evening. 07/09/17 07/09/18  Rebecka Apley, MD  levETIRAcetam (KEPPRA) 500 MG tablet Take 1 tablet (500 mg total) by mouth 2 (two) times daily. 03/11/16   Pucilowska, Jolanta B, MD  metoCLOPramide (REGLAN) 10 MG tablet Take 1 tablet (10 mg total) by mouth every 8 (eight) hours as needed. 07/09/17   Rebecka Apley, MD  sertraline (ZOLOFT) 50 MG tablet Take 1 tablet (50 mg total) by mouth daily. 03/11/16   Pucilowska, Ellin Goodie, MD    Allergies Latex  Family History  Problem Relation Age of Onset  . Diabetes Maternal Grandmother   . Cancer Maternal Grandfather  Social History Social History   Tobacco Use  . Smoking status: Current Some Day Smoker    Types: Cigarettes  . Smokeless tobacco: Never Used  Substance Use Topics  . Alcohol use: No  . Drug use: No    Review of Systems  Constitutional: No fever/chills Eyes: No visual changes. ENT: No sore throat. Cardiovascular: Denies chest pain. Respiratory: Denies shortness of breath. Gastrointestinal: Nausea and vomiting with hematemesis no abdominal pain.  No diarrhea.  No constipation. Genitourinary: Negative for  dysuria. Musculoskeletal: Negative for back pain. Skin: Negative for rash. Neurological: Negative for headaches   ____________________________________________   PHYSICAL EXAM:  VITAL SIGNS: ED Triage Vitals  Enc Vitals Group     BP 07/08/17 2030 126/67     Pulse Rate 07/08/17 2030 83     Resp 07/08/17 2030 17     Temp 07/08/17 2030 98.8 F (37.1 C)     Temp Source 07/08/17 2030 Oral     SpO2 07/08/17 2030 98 %     Weight 07/08/17 2021 125 lb (56.7 kg)     Height --      Head Circumference --      Peak Flow --      Pain Score 07/08/17 2021 5     Pain Loc --      Pain Edu? --      Excl. in GC? --     Constitutional: Alert and oriented. Well appearing and in mild distress. Eyes: Conjunctivae are normal. PERRL. EOMI. Head: Atraumatic. Nose: No congestion/rhinnorhea. Mouth/Throat: Mucous membranes are moist.  Oropharynx non-erythematous. Cardiovascular: Normal rate, regular rhythm. Grossly normal heart sounds.  Good peripheral circulation. Respiratory: Normal respiratory effort.  No retractions. Lungs CTAB. Gastrointestinal: Soft and nontender. No distention.  Positive bowel sounds Musculoskeletal: No lower extremity tenderness nor edema.   Neurologic:  Normal speech and language.  Skin:  Skin is warm, dry and intact.  Psychiatric: Mood and affect are normal.   ____________________________________________   LABS (all labs ordered are listed, but only abnormal results are displayed)  Labs Reviewed  COMPREHENSIVE METABOLIC PANEL - Abnormal; Notable for the following components:      Result Value   Sodium 134 (*)    Glucose, Bld 100 (*)    All other components within normal limits  POCT PREGNANCY, URINE - Abnormal; Notable for the following components:   Preg Test, Ur POSITIVE (*)    All other components within normal limits  LIPASE, BLOOD  CBC  URINALYSIS, COMPLETE (UACMP) WITH MICROSCOPIC  POC URINE PREG, ED  TYPE AND SCREEN    ____________________________________________  EKG  none ____________________________________________  RADIOLOGY  ED MD interpretation:  none  Official radiology report(s): No results found.  ____________________________________________   PROCEDURES  Procedure(s) performed: None  Procedures  Critical Care performed: No  ____________________________________________   INITIAL IMPRESSION / ASSESSMENT AND PLAN / ED COURSE  As part of my medical decision making, I reviewed the following data within the electronic MEDICAL RECORD NUMBER Notes from prior ED visits and [place] Controlled Substance Database   This is a 22 year old female who comes into the hospital today with some vomiting for the past 2 weeks and some streaks and clots of blood in her emesis today.  My differential diagnosis includes gastritis, Mallory-Weiss syndrome, pregnancy  We did check some blood work on the patient.  The patient CBC and CMP are unremarkable.  Her lipase is normal and she is O+.  At this time we are awaiting the results  of her pregnancy test.  The patient will receive a dose of Reglan and Pepcid.  The patient's pregnancy test resulted positive.  That does explain the patient's vomiting.  She states though that she is able to drink fluids.  She will be discharged home and encouraged to follow-up with the health department or with OB/GYN.      ____________________________________________   FINAL CLINICAL IMPRESSION(S) / ED DIAGNOSES  Final diagnoses:  Mallory-Weiss syndrome  Hyperemesis gravidarum     ED Discharge Orders        Ordered    metoCLOPramide (REGLAN) 10 MG tablet  Every 8 hours PRN     07/09/17 0227    famotidine (PEPCID) 40 MG tablet  Every evening     07/09/17 0227       Note:  This document was prepared using Dragon voice recognition software and may include unintentional dictation errors.    Rebecka Apley, MD 07/09/17 [phone]

## 2017-07-09 NOTE — Discharge Instructions (Addendum)
It appears that you are pregnant.  Please follow-up with OB/GYN for further evaluation of your vomiting.

## 2017-07-23 LAB — OB RESULTS CONSOLE HEPATITIS B SURFACE ANTIGEN: Hepatitis B Surface Ag: NEGATIVE

## 2017-07-23 LAB — OB RESULTS CONSOLE RUBELLA ANTIBODY, IGM: Rubella: IMMUNE

## 2017-07-23 LAB — OB RESULTS CONSOLE HIV ANTIBODY (ROUTINE TESTING): HIV: NONREACTIVE

## 2017-07-23 LAB — OB RESULTS CONSOLE GC/CHLAMYDIA: Gonorrhea: NEGATIVE

## 2017-09-20 ENCOUNTER — Other Ambulatory Visit: Payer: Self-pay | Admitting: Family Medicine

## 2017-09-20 ENCOUNTER — Other Ambulatory Visit (HOSPITAL_COMMUNITY): Payer: Self-pay | Admitting: Family Medicine

## 2017-09-20 DIAGNOSIS — Z3482 Encounter for supervision of other normal pregnancy, second trimester: Secondary | ICD-10-CM

## 2017-09-28 ENCOUNTER — Other Ambulatory Visit (HOSPITAL_COMMUNITY): Payer: Self-pay | Admitting: Family Medicine

## 2017-09-28 DIAGNOSIS — Z3A18 18 weeks gestation of pregnancy: Secondary | ICD-10-CM

## 2017-09-28 DIAGNOSIS — O09292 Supervision of pregnancy with other poor reproductive or obstetric history, second trimester: Secondary | ICD-10-CM

## 2017-09-28 DIAGNOSIS — Z363 Encounter for antenatal screening for malformations: Secondary | ICD-10-CM

## 2017-09-29 ENCOUNTER — Ambulatory Visit (HOSPITAL_COMMUNITY): Payer: Medicaid Other

## 2017-09-29 ENCOUNTER — Encounter (HOSPITAL_COMMUNITY): Payer: Self-pay

## 2017-10-04 ENCOUNTER — Other Ambulatory Visit (HOSPITAL_COMMUNITY): Payer: Self-pay | Admitting: *Deleted

## 2017-10-04 ENCOUNTER — Ambulatory Visit (HOSPITAL_COMMUNITY)
Admission: RE | Admit: 2017-10-04 | Discharge: 2017-10-04 | Disposition: A | Discharge status: Home / Self Care | Payer: Medicaid Other | Source: Ambulatory Visit | Attending: Family Medicine | Admitting: Family Medicine

## 2017-10-04 DIAGNOSIS — O43122 Velamentous insertion of umbilical cord, second trimester: Secondary | ICD-10-CM | POA: Diagnosis not present

## 2017-10-04 DIAGNOSIS — O43199 Other malformation of placenta, unspecified trimester: Secondary | ICD-10-CM

## 2017-10-04 DIAGNOSIS — O99352 Diseases of the nervous system complicating pregnancy, second trimester: Secondary | ICD-10-CM | POA: Diagnosis not present

## 2017-10-04 DIAGNOSIS — Z3A18 18 weeks gestation of pregnancy: Secondary | ICD-10-CM | POA: Insufficient documentation

## 2017-10-04 DIAGNOSIS — Z363 Encounter for antenatal screening for malformations: Secondary | ICD-10-CM | POA: Diagnosis not present

## 2017-10-04 DIAGNOSIS — O09292 Supervision of pregnancy with other poor reproductive or obstetric history, second trimester: Secondary | ICD-10-CM | POA: Diagnosis not present

## 2017-10-04 DIAGNOSIS — O4442 Low lying placenta NOS or without hemorrhage, second trimester: Secondary | ICD-10-CM | POA: Diagnosis not present

## 2017-10-04 DIAGNOSIS — G40909 Epilepsy, unspecified, not intractable, without status epilepticus: Secondary | ICD-10-CM

## 2017-10-04 DIAGNOSIS — Z3A20 20 weeks gestation of pregnancy: Secondary | ICD-10-CM

## 2017-10-20 ENCOUNTER — Observation Stay (HOSPITAL_COMMUNITY)
Admission: AD | Admit: 2017-10-20 | Discharge: 2017-10-21 | Disposition: A | Discharge status: Home / Self Care | Payer: Medicaid Other | Source: Ambulatory Visit | Attending: Obstetrics and Gynecology | Admitting: Obstetrics and Gynecology

## 2017-10-20 ENCOUNTER — Other Ambulatory Visit: Payer: Self-pay

## 2017-10-20 ENCOUNTER — Encounter (HOSPITAL_COMMUNITY): Payer: Self-pay | Admitting: Emergency Medicine

## 2017-10-20 DIAGNOSIS — R1030 Lower abdominal pain, unspecified: Secondary | ICD-10-CM

## 2017-10-20 DIAGNOSIS — Z79899 Other long term (current) drug therapy: Secondary | ICD-10-CM | POA: Diagnosis not present

## 2017-10-20 DIAGNOSIS — O99332 Smoking (tobacco) complicating pregnancy, second trimester: Secondary | ICD-10-CM | POA: Diagnosis not present

## 2017-10-20 DIAGNOSIS — F17210 Nicotine dependence, cigarettes, uncomplicated: Secondary | ICD-10-CM | POA: Insufficient documentation

## 2017-10-20 DIAGNOSIS — O4692 Antepartum hemorrhage, unspecified, second trimester: Secondary | ICD-10-CM | POA: Diagnosis present

## 2017-10-20 DIAGNOSIS — O4690 Antepartum hemorrhage, unspecified, unspecified trimester: Secondary | ICD-10-CM

## 2017-10-20 DIAGNOSIS — O209 Hemorrhage in early pregnancy, unspecified: Secondary | ICD-10-CM | POA: Diagnosis present

## 2017-10-20 DIAGNOSIS — O2340 Unspecified infection of urinary tract in pregnancy, unspecified trimester: Secondary | ICD-10-CM | POA: Diagnosis present

## 2017-10-20 DIAGNOSIS — O2342 Unspecified infection of urinary tract in pregnancy, second trimester: Secondary | ICD-10-CM | POA: Diagnosis not present

## 2017-10-20 DIAGNOSIS — Z3A23 23 weeks gestation of pregnancy: Secondary | ICD-10-CM | POA: Diagnosis not present

## 2017-10-20 NOTE — ED Provider Notes (Addendum)
[facility]-EMERGENCY DEPT Provider Note   CSN: [id number] Arrival date & time: 10/20/17  2231     History   Chief Complaint Chief Complaint  Patient presents with  . Threatened Miscarriage    [redacted] weeks pregnant  . Vaginal Bleeding    HPI Veronica Brooks is a 22 y.o. female.  HPI 22 year old African-American female who is G3P3 presents to the ED for evaluation of lower abdominal cramping and vaginal bleeding.  Patient is presently [redacted] weeks pregnant.  She is followed by OB.  She states that her symptoms began early this morning.  She reports intermittent lower abdominal cramping.  She reports that he has the urge to urinate frequently and that when she urinates she feels like she has the urge to push.  Patient states that her prior 2 pregnancies were without any difficulties and progressed to term.  She states that she has not had any issues with this current pregnancy.  Patient reports some nausea earlier today.  Denies any fevers or chills he denies any change in her bowel habits.  Denies any trauma to the abdomen.  Reports seeing one clot earlier today.  She is not take anything for her symptoms.  Symptoms are intermittent. Nothing makes better or worse.  Pt denies any fever, chill, ha, vision changes, lightheadedness, dizziness, congestion, neck pain, cp, sob, cough, urinary symptoms, change in bowel habits, melena, hematochezia, lower extremity paresthesias.  Past Medical History:  Diagnosis Date  . Bronchitis   . Depression   . Heart murmur    Does not require cardiologist  . Medical history non-contributory     Patient Active Problem List   Diagnosis Date Noted  . Severe recurrent major depression without psychotic features (HCC) 03/10/2016  . Acetaminophen overdose, intentional self-harm, initial encounter (HCC) 03/08/2016  . Depression 03/08/2016  . Suicidal behavior 03/08/2016  . Tylenol toxicity 03/08/2016  . Seizure disorder (HCC) 12/19/2014  .  Hypotension 12/19/2014  . Hypokalemia 12/19/2014  . UTI (urinary tract infection) 12/19/2014  . Postictal headache 12/19/2014  . Headache 12/19/2014  . Pregnancy 03/23/2014  . UTI in pregnancy 03/21/2014  . Suspected macroscopic fetus 03/21/2014  . LGA (large for gestational age) fetus affecting management of mother 03/21/2014  . Fetal renal anomaly   . Fetal macrosomia   . [redacted] weeks gestation of pregnancy   . High risk teen pregnancy in third trimester 02/14/2014  . [redacted] weeks gestation of pregnancy   . Late prenatal care 01/17/2014    Past Surgical History:  Procedure Laterality Date  . NO PAST SURGERIES       OB History    Gravida  3   Para  2   Term  2   Preterm      AB      Living  2     SAB      TAB      Ectopic      Multiple  0   Live Births  2            Home Medications    Prior to Admission medications   Medication Sig Start Date End Date Taking? Authorizing Provider  albuterol (PROVENTIL HFA;VENTOLIN HFA) 108 (90 BASE) MCG/ACT inhaler Inhale 2 puffs into the lungs every 6 (six) hours as needed for wheezing or shortness of breath.    [provider]  famotidine (PEPCID) 40 MG tablet Take 1 tablet (40 mg total) by mouth every evening. 07/09/17 07/09/18  Lucrezia Europe  P, MD  levETIRAcetam (KEPPRA) 500 MG tablet Take 1 tablet (500 mg total) by mouth 2 (two) times daily. 03/11/16   Pucilowska, Jolanta B, MD  metoCLOPramide (REGLAN) 10 MG tablet Take 1 tablet (10 mg total) by mouth every 8 (eight) hours as needed. 07/09/17   Rebecka ApleyWebster, Allison P, MD  sertraline (ZOLOFT) 50 MG tablet Take 1 tablet (50 mg total) by mouth daily. 03/11/16   PucilowskaEllin Goodie, Jolanta B, MD    Family History Family History  Problem Relation Age of Onset  . Diabetes Maternal Grandmother   . Cancer Maternal Grandfather     Social History Social History   Tobacco Use  . Smoking status: Current Some Day Smoker    Types: Cigarettes  . Smokeless tobacco: Never Used    Substance Use Topics  . Alcohol use: No  . Drug use: No     Allergies   Latex   Review of Systems Review of Systems  All other systems reviewed and are negative.    Physical Exam Updated Vital Signs BP 120/74 (BP Location: Left Arm)   Pulse 96   Temp 98.2 F (36.8 C) (Oral)   Resp 20   Ht 5\' 1"  (1.549 m)   Wt 59 kg   LMP 06/11/2017   SpO2 100%   BMI 24.56 kg/m   Physical Exam  Constitutional: She appears well-developed and well-nourished. No distress.  HENT:  Head: Normocephalic and atraumatic.  Eyes: Right eye exhibits no discharge. Left eye exhibits no discharge. No scleral icterus.  Neck: Normal range of motion.  Cardiovascular: Normal rate, regular rhythm, normal heart sounds and intact distal pulses. Exam reveals no gallop and no friction rub.  No murmur heard. Pulmonary/Chest: No respiratory distress.  Abdominal: Normal appearance and bowel sounds are normal. There is tenderness in the right lower quadrant, suprapubic area and left lower quadrant. There is no rigidity, no rebound, no guarding, no CVA tenderness, no tenderness at McBurney's point and negative Murphy's sign.  Genitourinary:  Genitourinary Comments: Chaperone present for exam for speculum exam only. No external lesions, swelling, erythema, or rash of the labia. No erythema, bleeding, or lesions noted in the vaginal vault.  Vaginal vault with mild discharge noted.  Cervical loss is closed.  There is no blood in the vaginal vault for clots.  Rapid OB nurse was at bedside who requested speculum exam.  Musculoskeletal: Normal range of motion.  Neurological: She is alert.  Skin: Skin is warm and dry. Capillary refill takes less than 2 seconds. No pallor.  Psychiatric: Her behavior is normal. Judgment and thought content normal.  Nursing note and vitals reviewed.    ED Treatments / Results  Labs (all labs ordered are listed, but only abnormal results are displayed) Labs Reviewed - No data to  display  EKG None  Radiology No results found.  Procedures Procedures (including critical care time)  Medications Ordered in ED Medications - No data to display   Initial Impression / Assessment and Plan / ED Course  I have reviewed the triage vital signs and the nursing notes.  Pertinent labs & imaging results that were available during my care of the patient were reviewed by me and considered in my medical decision making (see chart for details).     Resents to the ED for evaluation of lower abdominal pain and vaginal bleeding she is department [redacted] weeks pregnant.  Patient reports symptoms started earlier today.  She reports 2 other normal pregnancy.  Patient does report some urinary  symptoms.  She reports small amount of blood clots noted today.  Vital signs are reassuring in the ED.  Patient does have some lower suprapubic abdominal tenderness to palpation.  Speculum exam reveals no vaginal bleeding and closed cervical office.  Discharge was noted.  Fetal heart tones were assessed by myself and a rapid OB nurse and were found to be 155 bpm.  Labs are currently pending.  OB nurse talk to Dr. Emelda FearFerguson who is the attending for OB/GYN who [facility] and [facility] monitoring.  Differential diagnosis include miscarriage, placenta previa, placenta abruptio, UTI.  Patient remains hemodynamic stable this time.  Urine return for patient [facility].  Does appear infected with positive nitrites, bacteria, red cells and white cells.  I suspect the patient's symptoms may be secondary to UTI.  [facility] antibiotics at this time in order to the ED today.  CBC shows a moderate leukocytosis of 15,000.  Hemoglobin similar to patient's baseline.  Other labs still pending.    Final Clinical Impressions(s) / ED Diagnoses   Final diagnoses:  Vaginal bleeding in pregnancy  Lower abdominal pain     ED Discharge Orders    None       Rise MuLeaphart, Kenneth T, PA-C 10/21/17 0006    Rise MuLeaphart, Kenneth T, PA-C 10/21/17 0126    Molpus, Jonny RuizJohn, MD 10/21/17 [id number]

## 2017-10-20 NOTE — ED Notes (Signed)
Rapid OB RN called 

## 2017-10-20 NOTE — ED Triage Notes (Signed)
Pt present POV with complaints of lower abdominal pain and moderate vaginal bleeding. Patient is [redacted] weeks pregnant. Patient states this began around 2100. Patient states she has hurt all day.

## 2017-10-20 NOTE — Progress Notes (Signed)
RROB called to patient's bedside [facility] with complaints of heavy vaginal bleeding and lower abdominal tenderness that started this morning; patient states it started as spotting then proceeded to be heavier like a period; patient also stated that as the bleeding became more heavy her lower abdomen became more painful; patient states pain is constant at this time but gets "worse in waves"; patient is a G3P2 at 23 weeks at this time; patient denies problems with this pregnancy or with previous pregnancies up to this point; spec exam performed by ED provider and closed cervix noted; patient also complains of pressure with urination and urine is blood tinged; FHT 150-160s at this time; Dr Emelda FearFerguson notified of patient complaints and orders given to have [facility] further testing and monitoring; made patient aware of transfer and patient verbalized understanding at this time; ED provider aware and transfer request placed; [facility] charge and patient will go to bed 313; [facility] called for transfer at this time

## 2017-10-21 ENCOUNTER — Observation Stay (HOSPITAL_BASED_OUTPATIENT_CLINIC_OR_DEPARTMENT_OTHER): Payer: Medicaid Other

## 2017-10-21 ENCOUNTER — Encounter: Payer: Self-pay | Admitting: Emergency Medicine

## 2017-10-21 DIAGNOSIS — O26892 Other specified pregnancy related conditions, second trimester: Secondary | ICD-10-CM | POA: Diagnosis not present

## 2017-10-21 DIAGNOSIS — O4442 Low lying placenta NOS or without hemorrhage, second trimester: Secondary | ICD-10-CM | POA: Diagnosis not present

## 2017-10-21 DIAGNOSIS — O09292 Supervision of pregnancy with other poor reproductive or obstetric history, second trimester: Secondary | ICD-10-CM

## 2017-10-21 DIAGNOSIS — Z79899 Other long term (current) drug therapy: Secondary | ICD-10-CM | POA: Diagnosis not present

## 2017-10-21 DIAGNOSIS — O2342 Unspecified infection of urinary tract in pregnancy, second trimester: Secondary | ICD-10-CM | POA: Diagnosis not present

## 2017-10-21 DIAGNOSIS — O4692 Antepartum hemorrhage, unspecified, second trimester: Secondary | ICD-10-CM

## 2017-10-21 DIAGNOSIS — O99332 Smoking (tobacco) complicating pregnancy, second trimester: Secondary | ICD-10-CM | POA: Diagnosis not present

## 2017-10-21 DIAGNOSIS — O43129 Velamentous insertion of umbilical cord, unspecified trimester: Secondary | ICD-10-CM

## 2017-10-21 DIAGNOSIS — Z3A23 23 weeks gestation of pregnancy: Secondary | ICD-10-CM

## 2017-10-21 DIAGNOSIS — F17210 Nicotine dependence, cigarettes, uncomplicated: Secondary | ICD-10-CM | POA: Diagnosis not present

## 2017-10-21 DIAGNOSIS — O209 Hemorrhage in early pregnancy, unspecified: Secondary | ICD-10-CM | POA: Diagnosis present

## 2017-10-21 LAB — URINALYSIS, ROUTINE W REFLEX MICROSCOPIC
BILIRUBIN URINE: NEGATIVE
Bilirubin Urine: NEGATIVE
GLUCOSE, UA: NEGATIVE mg/dL
Glucose, UA: NEGATIVE mg/dL
Ketones, ur: NEGATIVE mg/dL
Ketones, ur: NEGATIVE mg/dL
NITRITE: POSITIVE — AB
NITRITE: POSITIVE — AB
PH: 6 (ref 5.0–8.0)
PROTEIN: 100 mg/dL — AB
PROTEIN: 100 mg/dL — AB
RBC / HPF: >50 RBC/hpf — ABNORMAL HIGH (ref 0–5)
Specific Gravity, Urine: 1.02 (ref 1.005–1.030)
Specific Gravity, Urine: 1.024 (ref 1.005–1.030)
WBC, UA: >50 WBC/hpf — ABNORMAL HIGH (ref 0–5)
pH: 7 (ref 5.0–8.0)

## 2017-10-21 LAB — CBC WITH DIFFERENTIAL/PLATELET
Basophils Absolute: 0 10*3/uL (ref 0.0–0.1)
Basophils Relative: 0 %
EOS ABS: 0.3 10*3/uL (ref 0.0–0.7)
Eosinophils Relative: 2 %
HCT: 34.3 % — ABNORMAL LOW (ref 36.0–46.0)
HEMOGLOBIN: 11.3 g/dL — AB (ref 12.0–15.0)
LYMPHS ABS: 2.4 10*3/uL (ref 0.7–4.0)
Lymphocytes Relative: 16 %
MCH: 31 pg (ref 26.0–34.0)
MCHC: 32.9 g/dL (ref 30.0–36.0)
MCV: 94 fL (ref 78.0–100.0)
Monocytes Absolute: 1.1 10*3/uL — ABNORMAL HIGH (ref 0.1–1.0)
Monocytes Relative: 7 %
NEUTROS PCT: 75 %
Neutro Abs: 11.5 10*3/uL — ABNORMAL HIGH (ref 1.7–7.7)
Platelets: 268 10*3/uL (ref 150–400)
RBC: 3.65 MIL/uL — ABNORMAL LOW (ref 3.87–5.11)
RDW: 13.1 % (ref 11.5–15.5)
WBC: 15.4 10*3/uL — AB (ref 4.0–10.5)

## 2017-10-21 LAB — HCG, QUANTITATIVE, PREGNANCY: HCG, BETA CHAIN, QUANT, S: 23920 m[IU]/mL — AB (ref <5)

## 2017-10-21 LAB — COMPREHENSIVE METABOLIC PANEL
ALT: 9 U/L (ref 0–44)
AST: 13 U/L — ABNORMAL LOW (ref 15–41)
Albumin: 3.5 g/dL (ref 3.5–5.0)
Alkaline Phosphatase: 50 U/L (ref 38–126)
Anion gap: 8 (ref 5–15)
BUN: 9 mg/dL (ref 6–20)
CHLORIDE: 106 mmol/L (ref 98–111)
CO2: 22 mmol/L (ref 22–32)
Calcium: 8.8 mg/dL — ABNORMAL LOW (ref 8.9–10.3)
Creatinine, Ser: 0.57 mg/dL (ref 0.44–1.00)
Glucose, Bld: 101 mg/dL — ABNORMAL HIGH (ref 70–99)
POTASSIUM: 3.6 mmol/L (ref 3.5–5.1)
Sodium: 136 mmol/L (ref 135–145)
TOTAL PROTEIN: 7.4 g/dL (ref 6.5–8.1)
Total Bilirubin: 0.2 mg/dL — ABNORMAL LOW (ref 0.3–1.2)

## 2017-10-21 LAB — TYPE AND SCREEN
ABO/RH(D): O POS
ANTIBODY SCREEN: NEGATIVE

## 2017-10-21 MED ORDER — CALCIUM CARBONATE ANTACID 500 MG PO CHEW: 2.0000 | CHEWABLE_TABLET | ORAL | Status: DC | PRN

## 2017-10-21 MED ORDER — BETAMETHASONE SOD PHOS & ACET 6 (3-3) MG/ML IJ SUSP
12.0000 mg | INTRAMUSCULAR | Status: DC
  Administered 2017-10-21: 12 mg via INTRAMUSCULAR
  Filled 2017-10-21: qty 2

## 2017-10-21 MED ORDER — PRENATAL MULTIVITAMIN CH: 1.0000 | ORAL_TABLET | Freq: Every day | ORAL | Status: DC

## 2017-10-21 MED ORDER — ACETAMINOPHEN 325 MG PO TABS: 650.0000 mg | ORAL_TABLET | ORAL | Status: DC | PRN

## 2017-10-21 MED ORDER — DOCUSATE SODIUM 100 MG PO CAPS: 100.0000 mg | ORAL_CAPSULE | Freq: Every day | ORAL | Status: DC

## 2017-10-21 MED ORDER — ZOLPIDEM TARTRATE 5 MG PO TABS: 5.0000 mg | ORAL_TABLET | Freq: Every evening | ORAL | Status: DC | PRN

## 2017-10-21 MED ORDER — CEPHALEXIN 500 MG PO CAPS: 500.0000 mg | ORAL_CAPSULE | Freq: Four times a day (QID) | ORAL | 0 refills | Status: DC

## 2017-10-21 NOTE — H&P (Addendum)
FACULTY PRACTICE ANTEPARTUM ADMISSION HISTORY AND PHYSICAL NOTE   History of Present Illness: Veronica Brooks is a 22 y.o. G3P2002 at 156w2d admitted for light vaginal bleeding in second trimester.  Patient initially reported some sense of pressure x 1 day, denies lifting , straining,significant physical activity. Denies recent sexual activity. Patient reports the fetal movement as active. Patient reports uterine contraction  activity as none. Patient reports  vaginal bleeding as less flow than a normal period. Patient describes fluid per vagina as None. Fetal presentation is unsure.  Patient Active Problem List   Diagnosis Date Noted  . Vaginal bleeding in pregnancy, second trimester 10/21/2017  . Severe recurrent major depression without psychotic features (HCC) 03/10/2016  . Acetaminophen overdose, intentional self-harm, initial encounter (HCC) 03/08/2016  . Depression 03/08/2016  . Suicidal behavior 03/08/2016  . Tylenol toxicity 03/08/2016  . Seizure disorder (HCC) 12/19/2014  . Hypotension 12/19/2014  . Hypokalemia 12/19/2014  . UTI (urinary tract infection) 12/19/2014  . Postictal headache 12/19/2014  . Headache 12/19/2014  . Pregnancy 03/23/2014  . UTI in pregnancy 03/21/2014  . Suspected macroscopic fetus 03/21/2014  . Fetal macrosomia   . High risk teen pregnancy in third trimester 02/14/2014    Past Medical History:  Diagnosis Date  . Bronchitis   . Depression   . Heart murmur    Does not require cardiologist  . Medical history non-contributory     Past Surgical History:  Procedure Laterality Date  . NO PAST SURGERIES      OB History  Gravida Para Term Preterm AB Living  3 2 2     2   SAB TAB Ectopic Multiple Live Births        0 2    # Outcome Date GA Lbr Len/2nd Weight Sex Delivery Anes PTL Lv  3 Current           2 Term 03/23/14 6939w0d 04:14 / 00:12 3610 g M Vag-Spont None  LIV  1 Term 03/13/12 8262w0d  3629 g F Vag-Spont None N LIV    Social History    Socioeconomic History  . Marital status: Single    Spouse name: Not on file  . Number of children: Not on file  . Years of education: Not on file  . Highest education level: Not on file  Occupational History  . Not on file  Social Needs  . Financial resource strain: Not on file  . Food insecurity:    Worry: Not on file    Inability: Not on file  . Transportation needs:    Medical: Not on file    Non-medical: Not on file  Tobacco Use  . Smoking status: Current Some Day Smoker    Types: Cigarettes  . Smokeless tobacco: Never Used  Substance and Sexual Activity  . Alcohol use: No  . Drug use: No  . Sexual activity: Yes    Birth control/protection: None  Lifestyle  . Physical activity:    Days per week: Not on file    Minutes per session: Not on file  . Stress: Not on file  Relationships  . Social connections:    Talks on phone: Not on file    Gets together: Not on file    Attends religious service: Not on file    Active member of club or organization: Not on file    Attends meetings of clubs or organizations: Not on file    Relationship status: Not on file  Other Topics Concern  . Not on  file  Social History Narrative  . Not on file    Family History  Problem Relation Age of Onset  . Diabetes Maternal Grandmother   . Cancer Maternal Grandfather     Allergies  Allergen Reactions  . Latex Rash and Other (See Comments)    Reaction:  Burning     Medications Prior to Admission  Medication Sig Dispense Refill Last Dose  . albuterol (PROVENTIL HFA;VENTOLIN HFA) 108 (90 BASE) MCG/ACT inhaler Inhale 2 puffs into the lungs every 6 (six) hours as needed for wheezing or shortness of breath.   prn at prn  . famotidine (PEPCID) 40 MG tablet Take 1 tablet (40 mg total) by mouth every evening. 14 tablet 0   . levETIRAcetam (KEPPRA) 500 MG tablet Take 1 tablet (500 mg total) by mouth 2 (two) times daily. 60 tablet 6   . metoCLOPramide (REGLAN) 10 MG tablet Take 1 tablet  (10 mg total) by mouth every 8 (eight) hours as needed. 20 tablet 0   . sertraline (ZOLOFT) 50 MG tablet Take 1 tablet (50 mg total) by mouth daily. 30 tablet 2     Review of Systems - History obtained from chart review and the patient as well as Rapid Response nurse  Vitals:  BP (!) 117/53 (BP Location: Left Arm)   Pulse 81   Temp 98.6 F (37 C) (Oral)   Resp 20   Ht 5\' 1"  (1.549 m)   Wt 59 kg   LMP 05/11/2017   SpO2 100%   BMI 24.56 kg/m  Physical Examination: CONSTITUTIONAL: Well-developed, well-nourished female in no acute distress.  HENT:  Normocephalic, atraumatic, External right and left ear normal. Oropharynx is clear and moist EYES: Conjunctivae and EOM are normal. Pupils are equal, round, and reactive to light. No scleral icterus.  NECK: Normal range of motion, supple, no masses SKIN: Skin is warm and dry. No rash noted. Not diaphoretic. No erythema. No pallor. NEUROLGIC: Alert and oriented to person, place, and time. Normal reflexes, muscle tone coordination. No cranial nerve deficit noted. PSYCHIATRIC: Normal mood and affect. Normal behavior. Normal judgment and thought content. CARDIOVASCULAR: Normal heart rate noted, regular rhythm RESPIRATORY: Effort and breath sounds normal, no problems with respiration noted ABDOMEN: Soft, nontender, nondistended, gravid. MUSCULOSKELETAL: Normal range of motion. No edema and no tenderness. 2+ distal pulses.  Cervix: Not evaluated. and found by prior ED provider by speculum exam to be closed, minimal blood,/ Long/ and fetal presentation is unsure. Membranes:intact Fetal Monitoring:Baseline: 145 bpm, Variability: Good {> 6 bpm), Accelerations: Non-reactive but appropriate for gestational age and Decelerations: Absent Tocometer: Flat  Labs:  Results for orders placed or performed during the hospital encounter of 10/20/17 (from the past 24 hour(s))  CBC with Differential   Collection Time: 10/20/17 11:35 PM  Result Value Ref Range    WBC 15.4 (H) 4.0 - 10.5 K/uL   RBC 3.65 (L) 3.87 - 5.11 MIL/uL   Hemoglobin 11.3 (L) 12.0 - 15.0 g/dL   HCT 16.1 (L) 09.6 - 04.5 %   MCV 94.0 78.0 - 100.0 fL   MCH 31.0 26.0 - 34.0 pg   MCHC 32.9 30.0 - 36.0 g/dL   RDW 40.9 81.1 - 91.4 %   Platelets 268 150 - 400 K/uL   Neutrophils Relative % 75 %   Neutro Abs 11.5 (H) 1.7 - 7.7 K/uL   Lymphocytes Relative 16 %   Lymphs Abs 2.4 0.7 - 4.0 K/uL   Monocytes Relative 7 %   Monocytes  Absolute 1.1 (H) 0.1 - 1.0 K/uL   Eosinophils Relative 2 %   Eosinophils Absolute 0.3 0.0 - 0.7 K/uL   Basophils Relative 0 %   Basophils Absolute 0.0 0.0 - 0.1 K/uL  hCG, quantitative, pregnancy   Collection Time: 10/20/17 11:35 PM  Result Value Ref Range   hCG, Beta Chain, Quant, S 23,920 (H) <5 mIU/mL  Comprehensive metabolic panel   Collection Time: 10/20/17 11:35 PM  Result Value Ref Range   Sodium 136 135 - 145 mmol/L   Potassium 3.6 3.5 - 5.1 mmol/L   Chloride 106 98 - 111 mmol/L   CO2 22 22 - 32 mmol/L   Glucose, Bld 101 (H) 70 - 99 mg/dL   BUN 9 6 - 20 mg/dL   Creatinine, Ser 1.61 0.44 - 1.00 mg/dL   Calcium 8.8 (L) 8.9 - 10.3 mg/dL   Total Protein 7.4 6.5 - 8.1 g/dL   Albumin 3.5 3.5 - 5.0 g/dL   AST 13 (L) 15 - 41 U/L   ALT 9 0 - 44 U/L   Alkaline Phosphatase 50 38 - 126 U/L   Total Bilirubin 0.2 (L) 0.3 - 1.2 mg/dL   GFR calc non Af Amer >60 >60 mL/min   GFR calc Af Amer >60 >60 mL/min   Anion gap 8 5 - 15  Urinalysis, Routine w reflex microscopic   Collection Time: 10/20/17 11:50 PM  Result Value Ref Range   Color, Urine YELLOW YELLOW   APPearance CLOUDY (A) CLEAR   Specific Gravity, Urine 1.020 1.005 - 1.030   pH 7.0 5.0 - 8.0   Glucose, UA NEGATIVE NEGATIVE mg/dL   Hgb urine dipstick LARGE (A) NEGATIVE   Bilirubin Urine NEGATIVE NEGATIVE   Ketones, ur NEGATIVE NEGATIVE mg/dL   Protein, ur 096 (A) NEGATIVE mg/dL   Nitrite POSITIVE (A) NEGATIVE   Leukocytes, UA SMALL (A) NEGATIVE   RBC / HPF >50 (H) 0 - 5 RBC/hpf    WBC, UA >50 (H) 0 - 5 WBC/hpf   Bacteria, UA RARE (A) NONE SEEN   Squamous Epithelial / LPF 0-5 0 - 5    Imaging Studies: [place] Mfm Ob Detail +14 Wk  Result Date: 10/04/2017 ----------------------------------------------------------------------  OBSTETRICS REPORT                        (Corrected Final 10/04/2017 04:36                                                                          pm) ---------------------------------------------------------------------- Patient Info  ID #:       [id number]                          D.O.B.:  November 30, 1995 (21 yrs)  Name:       Veronica Brooks                      Visit Date: 10/04/2017 02:48 pm ---------------------------------------------------------------------- Performed By  Performed By:     Lenise Arena        Ref. Address:     [facility]                                                             [facility]                                                             [address]                                                             [place]                                                             [address]  Attending:        Noralee Space MD        Location:         [facility]  Referred By:      Karie Fetch MD ---------------------------------------------------------------------- Orders   #  Description                                 Code   1  [place] MFM OB DETAIL +14 WK                     [phone]  ----------------------------------------------------------------------   #  Ordered By               Order #        Accession #    Episode #   1  Karie Fetch              [id number]      [id number]     [id number]  ---------------------------------------------------------------------- Indications   [redacted] weeks gestation of pregnancy                Z3A.20   Encounter for antenatal screening for           Z36.3   malformations  Poor obstetric history: Previous gestational   O09.299   diabetes   Seizure disorder                               O99.350 G40.909   Encounter for Strep B                          Z36.85   Low lying placenta, antepartum                 O44.40   Velamentous insertion of umbilical cord        O43.129  ---------------------------------------------------------------------- Fetal Evaluation  Num Of Fetuses:     1  Fetal Heart         148  Rate(bpm):  Cardiac Activity:   Observed  Presentation:       Transverse, head to maternal right  Placenta:           Posterior  P. Cord Insertion:  Marginal insertion  Amniotic Fluid  AFI FV:      Subjectively within normal limits                              Largest Pocket(cm)                              4.47 ---------------------------------------------------------------------- Biometry  BPD:      43.7  mm     G. Age:  19w 1d          3  %    CI:        67.34   %    70 - 86                                                          FL/HC:      20.1   %    15.9 - 20.3  HC:      170.5  mm     G. Age:  19w 5d          5  %    HC/AC:      1.11        1.06 - 1.25  AC:      153.7  mm     G. Age:  20w 4d         34  %    FL/BPD:     78.3   %  FL:       34.2  mm     G. Age:  20w 5d         38  %    FL/AC:      22.3   %    20 - 24  HUM:      32.6  mm     G. Age:  21w 0d         50  %  CER:      21.6  mm     G. Age:  20w 4d         44  %  CM:        4.1  mm  Est. FW:     358  gm    0 lb 13 oz      37  % ---------------------------------------------------------------------- Gestational Age  LMP:           20w 6d        Date:  05/11/17                 EDD:   02/15/18  U/S Today:     20w 0d                                        EDD:   02/21/18  Best:          Cherylann Parr 6d     Det. By:  LMP  (05/11/17)          EDD:   02/15/18 ---------------------------------------------------------------------- Anatomy  Cranium:               Appears normal         Aortic Arch:             Appears normal  Cavum:                 Appears normal         Ductal Arch:            Appears normal  Ventricles:            Appears normal         Diaphragm:              Appears normal  Choroid Plexus:        Appears normal         Stomach:                Appears normal, left                                                                        sided  Cerebellum:            Appears normal         Abdomen:                Appears normal  Posterior Fossa:       Appears normal         Abdominal Wall:         Appears nml (cord                                                                        insert, abd wall)  Nuchal Fold:           Not applicable (>20    Cord Vessels:           Appears normal ([redacted]  wks GA)                                        vessel cord)  Face:                  Appears normal         Kidneys:                Appear normal                         (orbits and profile)  Lips:                  Appears normal         Bladder:                Appears normal  Thoracic:              Appears normal         Spine:                  Appears normal  Heart:                 Appears normal         Upper Extremities:      Appears normal                         (4CH, axis, and situs  RVOT:                  Appears normal         Lower Extremities:      Appears normal  LVOT:                  Appears normal  Other:  Female gender. Heels and 5th digit visualized. Open hands          visualized. Nasal bone visualized. ---------------------------------------------------------------------- Cervix Uterus Adnexa  Cervix  Length:           3.48  cm.  Normal appearance by transabdominal scan.  Uterus  No abnormality visualized.  Left Ovary  Size(cm)     3.32   x   1.43   x  2.06      Vol(ml): 5.1  Within normal limits.  Right Ovary  Size(cm)     3.12   x   2.27   x  1.92      Vol(ml): 7.1  Within normal limits.  Cul De Sac:   No free fluid seen.  Adnexa:       No abnormality visualized.  ---------------------------------------------------------------------- Impression  We performed fetal anatomy scan. No makers of  aneuploidies or fetal structural defects are seen. Fetal  biometry is consistent with her previously-established dates.  Amniotic fluid is normal and good fetal activity is seen.  Marginal cord insertion is seen. I explained the finding that  marginal cord insertion is not usually associated with fetal  adverse outcomes. However, in some cases, it can be  associated with fetal growth restriction. We recommend serial  fetal growth assessments till delivery.  Patient had declined aneuploidy screening. ---------------------------------------------------------------------- Recommendations  An appointment was made for her to return in 6 weeks for  fetal growth assessment. ----------------------------------------------------------------------  Noralee Space, MD Electronically Signed Corrected Final Report  10/04/2017 04:36 pm ----------------------------------------------------------------------    Assessment and Plan: Patient Active Problem List   Diagnosis Date Noted  . Vaginal bleeding in pregnancy, second trimester 10/21/2017  . Severe recurrent major depression without psychotic features (HCC) 03/10/2016  . Acetaminophen overdose, intentional self-harm, initial encounter (HCC) 03/08/2016  . Depression 03/08/2016  . Suicidal behavior 03/08/2016  . Tylenol toxicity 03/08/2016  . Seizure disorder (HCC) 12/19/2014  . Hypotension 12/19/2014  . Hypokalemia 12/19/2014  . UTI (urinary tract infection) 12/19/2014  . Postictal headache 12/19/2014  . Headache 12/19/2014  . Pregnancy 03/23/2014  . UTI in pregnancy 03/21/2014  . Suspected macroscopic fetus 03/21/2014  . Fetal macrosomia   . High risk teen pregnancy in third trimester 02/14/2014   Admit to Antenatal OB followup  u/s ordered Betamethasone x 2 doses  Will recheck presentation if she does  progress in preterm labor to determine route of delivery Routine antenatal care   .Tilda Burrow, MD

## 2017-10-21 NOTE — Progress Notes (Signed)
Report given to [facility] at this time 

## 2017-10-21 NOTE — Discharge Instructions (Signed)

## 2017-10-21 NOTE — Discharge Summary (Signed)
Physician Discharge Summary  Patient ID: Veronica Brooks MRN: [id number] DOB/AGE: 1995/09/26 21 y.o.  Admit date: 10/20/2017 Discharge date: 10/21/2017  Admission Diagnoses: pregnancy 23 wk, vaginal bleeding in second trimester  Discharge Diagnoses:  Active Problems:   UTI (urinary tract infection) during pregnancy no evidence vaginal bleeding  Discharged Condition: good  Hospital Course: this 21 yr G3P2002 at 2112w2d [facility] bleeding in second trimester. She had given a hx of pelvic pressure x a day, then noted small amount of bleeding , noted while wiping after defecation, wiping front to back. The patient history became more specific with repeat questioning. [facility] . Speculum exam there done. Described as:  Genitourinary Comments: Chaperone present for exam for speculum exam only. No external lesions, swelling, erythema, or rash of the labia. No erythema, bleeding, or lesions noted in the vaginal vault.  Vaginal vault with mild discharge noted.  Cervical loss is closed.  There is no blood in the vaginal vault for clots.  Rapid OB nurse was at bedside who requested speculum exam.  Urinalysis was + for blood, and Nitrites.  Consults: obstetrics  Significant Diagnostic Studies: labs: u/a + nitrites, +++RBC,  and radiology: Ultrasound: *which showed a posterior placenta, 2 cm from internal os, with no suspicion of blood at placental edge or underneath placenta.  Catheterized u/a + for the blood and nitrites. Repeat speculum exam shows NO blood, totally normal secretions. There was a tiny abrasion of the tissue just in front of the urethra that may have contributed to blood noted at wiping.  Treatments: steroids: betamethasone one dose, during initial eval   begun on Keflex at d/c  Discharge Exam: Blood pressure (!) 95/54, pulse 89, temperature 98.4 F (36.9 C), temperature  source Oral, resp. rate 18, height 5\' 1"  (1.549 m), weight 59 kg, last menstrual period 05/11/2017, SpO2 97 %, unknown if currently breastfeeding. General appearance: alert, cooperative and no distress Head: Normocephalic, without obvious abnormality, atraumatic GI: soft, non-tender; bowel sounds normal; no masses,  no organomegaly Pelvic: cervix normal in appearance, external genitalia normal and tiny abrasion just in front of urethra,   Disposition: Discharge disposition: 01-Home or Self Care       Discharge Instructions    Call MD for:  severe uncontrolled pain   Complete by:  As directed    Diet - low sodium heart healthy   Complete by:  As directed    Increase activity slowly   Complete by:  As directed      Allergies as of 10/21/2017      Reactions   Latex Rash, Other (See Comments)   Reaction:  Burning       Medication List    TAKE these medications   albuterol 108 (90 Base) MCG/ACT inhaler Commonly known as:  PROVENTIL HFA;VENTOLIN HFA Inhale 2 puffs into the lungs every 6 (six) hours as needed for wheezing or shortness of breath.   cephALEXin 500 MG capsule Commonly known as:  KEFLEX Take 1 capsule (500 mg total) by mouth 4 (four) times daily.   famotidine 40 MG tablet Commonly known as:  PEPCID Take 1 tablet (40 mg total) by mouth every evening.   levETIRAcetam 500 MG tablet Commonly known as:  KEPPRA Take 1 tablet (500 mg total) by mouth 2 (two) times daily.   metoCLOPramide 10 MG tablet Commonly known as:  REGLAN Take 1  tablet (10 mg total) by mouth every 8 (eight) hours as needed.   sertraline 50 MG tablet Commonly known as:  ZOLOFT Take 1 tablet (50 mg total) by mouth daily.      Follow-up Information    [facility] Follow up in 2 week(s).   Specialty:  General Practice Contact information: [address] [place] [address] [phone]           Signed: Tilda Burrow 10/21/2017, 9:20  AM

## 2017-10-23 LAB — URINE CULTURE
Culture: >=100000 — AB
Special Requests: NORMAL

## 2017-11-11 ENCOUNTER — Inpatient Hospital Stay (EMERGENCY_DEPARTMENT_HOSPITAL)
Admission: AD | Admit: 2017-11-11 | Discharge: 2017-11-11 | Disposition: A | Discharge status: Home / Self Care | Payer: Medicaid Other | Source: Ambulatory Visit | Attending: Obstetrics and Gynecology | Admitting: Obstetrics and Gynecology

## 2017-11-11 ENCOUNTER — Inpatient Hospital Stay (HOSPITAL_COMMUNITY)
Admission: AD | Admit: 2017-11-11 | Discharge: 2017-11-11 | Disposition: A | Discharge status: Home / Self Care | Payer: Medicaid Other | Source: Ambulatory Visit | Attending: Obstetrics and Gynecology | Admitting: Obstetrics and Gynecology

## 2017-11-11 ENCOUNTER — Encounter (HOSPITAL_COMMUNITY): Payer: Self-pay | Admitting: *Deleted

## 2017-11-11 DIAGNOSIS — O2342 Unspecified infection of urinary tract in pregnancy, second trimester: Secondary | ICD-10-CM | POA: Insufficient documentation

## 2017-11-11 DIAGNOSIS — M545 Low back pain: Secondary | ICD-10-CM | POA: Insufficient documentation

## 2017-11-11 DIAGNOSIS — Z87891 Personal history of nicotine dependence: Secondary | ICD-10-CM | POA: Diagnosis not present

## 2017-11-11 DIAGNOSIS — O26892 Other specified pregnancy related conditions, second trimester: Secondary | ICD-10-CM | POA: Insufficient documentation

## 2017-11-11 DIAGNOSIS — Z3A26 26 weeks gestation of pregnancy: Secondary | ICD-10-CM | POA: Diagnosis not present

## 2017-11-11 LAB — CBC WITH DIFFERENTIAL/PLATELET
BASOS ABS: 0 10*3/uL (ref 0.0–0.1)
Basophils Relative: 0 %
EOS ABS: 0.2 10*3/uL (ref 0.0–0.7)
Eosinophils Relative: 1 %
HCT: 30.4 % — ABNORMAL LOW (ref 36.0–46.0)
HEMOGLOBIN: 10.1 g/dL — AB (ref 12.0–15.0)
LYMPHS ABS: 3.6 10*3/uL (ref 0.7–4.0)
Lymphocytes Relative: 21 %
MCH: 30.6 pg (ref 26.0–34.0)
MCHC: 33.2 g/dL (ref 30.0–36.0)
MCV: 92.1 fL (ref 78.0–100.0)
Monocytes Absolute: 0.8 10*3/uL (ref 0.1–1.0)
Monocytes Relative: 5 %
Neutro Abs: 12.7 10*3/uL — ABNORMAL HIGH (ref 1.7–7.7)
Neutrophils Relative %: 73 %
PLATELETS: 220 10*3/uL (ref 150–400)
RBC: 3.3 MIL/uL — AB (ref 3.87–5.11)
RDW: 13.4 % (ref 11.5–15.5)
WBC: 17.2 10*3/uL — ABNORMAL HIGH (ref 4.0–10.5)

## 2017-11-11 LAB — URINALYSIS, ROUTINE W REFLEX MICROSCOPIC
BILIRUBIN URINE: NEGATIVE
GLUCOSE, UA: NEGATIVE mg/dL
Ketones, ur: 20 mg/dL — AB
NITRITE: POSITIVE — AB
PH: 6 (ref 5.0–8.0)
Protein, ur: 100 mg/dL — AB
SPECIFIC GRAVITY, URINE: 1.016 (ref 1.005–1.030)
Squamous Epithelial / LPF: >50 — ABNORMAL HIGH (ref 0–5)
WBC, UA: >50 WBC/hpf — ABNORMAL HIGH (ref 0–5)

## 2017-11-11 MED ORDER — CEPHALEXIN 500 MG PO CAPS: 500.0000 mg | ORAL_CAPSULE | Freq: Four times a day (QID) | ORAL | 2 refills | Status: DC

## 2017-11-11 MED ORDER — CEFTRIAXONE SODIUM 250 MG IJ SOLR
250.0000 mg | Freq: Once | INTRAMUSCULAR | Status: AC
  Administered 2017-11-11: 250 mg via INTRAMUSCULAR
  Filled 2017-11-11: qty 250

## 2017-11-11 NOTE — MAU Note (Signed)
NOT IN LOBBY 

## 2017-11-11 NOTE — Discharge Instructions (Signed)

## 2017-11-11 NOTE — MAU Provider Note (Signed)
Chief Complaint:  Back Pain   First Provider Initiated Contact with Patient 11/11/17 2157     HPI: Veronica Brooks is a 22 y.o. G3P2002 at 107w2dwho presents to maternity admissions reporting fever (did not take temp), sweats and low back pain.  Had been treated in August for UTI and prescribed Keflex.  Only took 3 days of it.  . She reports good fetal movement, denies LOF, vaginal bleeding, vaginal itching/burning, urinary symptoms, h/a, dizziness, n/v, diarrhea, constipation or fever/chills.    Gets prenatal care in [place] at a Family Medicine office who will not be delivering her.  States they told her to pick where she wants to deliver.  Has not arranged that.   States plans to come here for delivery  Back Pain  This is a new problem. The current episode started in the past 7 days. The problem occurs constantly. The problem is unchanged. The pain is present in the lumbar spine and sacro-iliac. The quality of the pain is described as aching. The pain does not radiate. Associated symptoms include a fever. Pertinent negatives include no abdominal pain, dysuria, leg pain or weakness. She has tried nothing for the symptoms.   RN Note: PT SAYS  X4 DAYS - HAS FELT FEVER, SHAKING, SWEATING,  NO APPETITE.  BACK PAIN AT NIGHT - WORSE.   [facility]-  WITH [facility] - PLANS  TO DELIVER HERE.  Past Medical History: Past Medical History:  Diagnosis Date  . Bronchitis   . Depression   . Heart murmur    Does not require cardiologist  . Medical history non-contributory     Past obstetric history: OB History  Gravida Para Term Preterm AB Living  3 2 2     2   SAB TAB Ectopic Multiple Live Births        0 2    # Outcome Date GA Lbr Len/2nd Weight Sex Delivery Anes PTL Lv  3 Current           2 Term 03/23/14 [redacted]w[redacted]d 04:14 / 00:12 3610 g M Vag-Spont None  LIV  1 Term 03/13/12 [redacted]w[redacted]d  3629 g F Vag-Spont None N LIV    Past Surgical History: Past Surgical History:  Procedure Laterality Date  . NO PAST  SURGERIES      Family History: Family History  Problem Relation Age of Onset  . Diabetes Maternal Grandmother   . Cancer Maternal Grandfather     Social History: Social History   Tobacco Use  . Smoking status: Former Smoker    Types: Cigarettes  . Smokeless tobacco: Never Used  Substance Use Topics  . Alcohol use: No  . Drug use: No    Allergies:  Allergies  Allergen Reactions  . Latex Rash and Other (See Comments)    Reaction:  Burning     Meds:  Medications Prior to Admission  Medication Sig Dispense Refill Last Dose  . albuterol (PROVENTIL HFA;VENTOLIN HFA) 108 (90 BASE) MCG/ACT inhaler Inhale 2 puffs into the lungs every 6 (six) hours as needed for wheezing or shortness of breath.   prn at prn  . cephALEXin (KEFLEX) 500 MG capsule Take 1 capsule (500 mg total) by mouth 4 (four) times daily. 28 capsule 0   . famotidine (PEPCID) 40 MG tablet Take 1 tablet (40 mg total) by mouth every evening. 14 tablet 0   . levETIRAcetam (KEPPRA) 500 MG tablet Take 1 tablet (500 mg total) by mouth 2 (two) times daily. 60 tablet 6   .  metoCLOPramide (REGLAN) 10 MG tablet Take 1 tablet (10 mg total) by mouth every 8 (eight) hours as needed. 20 tablet 0   . sertraline (ZOLOFT) 50 MG tablet Take 1 tablet (50 mg total) by mouth daily. 30 tablet 2     I have reviewed patient's Past Medical Hx, Surgical Hx, Family Hx, Social Hx, medications and allergies.   ROS:  Review of Systems  Constitutional: Positive for fever.  Gastrointestinal: Negative for abdominal pain.  Genitourinary: Negative for dysuria.  Musculoskeletal: Positive for back pain.  Neurological: Negative for weakness.   Other systems negative  Physical Exam   Patient Vitals for the past 24 hrs:  BP Temp Temp src Pulse Resp Height Weight  11/11/17 2114 (!) 115/56 99.1 F (37.3 C) Oral (!) 105 20 5\' 1"  (1.549 m) 60.6 kg   Constitutional: Well-developed, well-nourished female in no acute distress.  Cardiovascular:  normal rate and rhythm Respiratory: normal effort, clear to auscultation bilaterally GI: Abd soft, non-tender, gravid appropriate for gestational age.   No rebound or guarding. MS: Extremities nontender, no edema, normal ROM Neurologic: Alert and oriented x 4.  GU: Neg CVATenderness bilaterally  PELVIC EXAM: deferred   FHT:  Baseline 145 , moderate variability, accelerations present, no decelerations Contractions: uterine irritability   Labs: --/--/O POS (08/22 0601) Results for orders placed or performed during the hospital encounter of 11/11/17 (from the past 72 hour(s))  CBC with Differential/Platelet     Status: Abnormal   Collection Time: 11/11/17 10:12 PM  Result Value Ref Range   WBC 17.2 (H) 4.0 - 10.5 K/uL   RBC 3.30 (L) 3.87 - 5.11 MIL/uL   Hemoglobin 10.1 (L) 12.0 - 15.0 g/dL   HCT 01.0 (L) 27.2 - 53.6 %   MCV 92.1 78.0 - 100.0 fL   MCH 30.6 26.0 - 34.0 pg   MCHC 33.2 30.0 - 36.0 g/dL   RDW 64.4 03.4 - 74.2 %   Platelets 220 150 - 400 K/uL   Neutrophils Relative % 73 %   Neutro Abs 12.7 (H) 1.7 - 7.7 K/uL   Lymphocytes Relative 21 %   Lymphs Abs 3.6 0.7 - 4.0 K/uL   Monocytes Relative 5 %   Monocytes Absolute 0.8 0.1 - 1.0 K/uL   Eosinophils Relative 1 %   Eosinophils Absolute 0.2 0.0 - 0.7 K/uL   Basophils Relative 0 %   Basophils Absolute 0.0 0.0 - 0.1 K/uL    Comment: Performed at [facility], [address], [place], [place] [address]   Urinalysis    Component Value Date/Time   COLORURINE AMBER (A) 11/11/2017 2128   APPEARANCEUR CLOUDY (A) 11/11/2017 2128   APPEARANCEUR Cloudy 04/29/2012 1012   LABSPEC 1.016 11/11/2017 2128   LABSPEC 1.021 04/29/2012 1012   PHURINE 6.0 11/11/2017 2128   GLUCOSEU NEGATIVE 11/11/2017 2128   GLUCOSEU Negative 04/29/2012 1012   HGBUR MODERATE (A) 11/11/2017 2128   BILIRUBINUR NEGATIVE 11/11/2017 2128   BILIRUBINUR Negative 04/29/2012 1012   KETONESUR 20 (A) 11/11/2017 2128   PROTEINUR 100 (A) 11/11/2017 2128    UROBILINOGEN 1.0 10/26/2014 1501   NITRITE POSITIVE (A) 11/11/2017 2128   LEUKOCYTESUR MODERATE (A) 11/11/2017 2128   LEUKOCYTESUR Trace 04/29/2012 1012   . Imaging:   MAU Course/MDM: I have ordered labs and reviewed results. Urine indicates UTI.  Some leukocytosis NST reviewed, reassuring Consult Dr Alysia Penna with presentation, exam findings and test results.  Treatments in MAU included Rocephin IM then Rx Keflex for 7 days at home.  Warned to take all of med, may get resistance and substandard treatment with could lead to pyelonephritis if she does not..    Assessment: Single intrauterine pregnancy at 7250w3d Pelvic and back pain  Urinary Tract Infection, likely due to noncompliance with antibiotics for last infection  Plan: Discharge home Rx Keflex x 7 days Push fluids Preterm Labor precautions and fetal kick counts Follow up in Office for prenatal visits and recheck urine  Encouraged to return here or to other Urgent Care/ED if she develops worsening of symptoms, increase in pain, fever, or other concerning symptoms.   Pt stable at time of discharge.  Wynelle BourgeoisMarie Thong Feeny CNM, MSN Certified Nurse-Midwife 11/11/2017 9:57 PM

## 2017-11-11 NOTE — MAU Note (Signed)
PT SAYS  X4 DAYS - HAS FELT FEVER, SHAKING, SWEATING,  NO APPETITE.  BACK PAIN AT NIGHT - WORSE.   [facility]-  WITH [facility] - PLANS  TO DELIVER HERE.

## 2017-11-15 ENCOUNTER — Ambulatory Visit (HOSPITAL_COMMUNITY)
Admission: RE | Admit: 2017-11-15 | Discharge: 2017-11-15 | Disposition: A | Discharge status: Home / Self Care | Payer: Medicaid Other | Source: Ambulatory Visit | Attending: Family Medicine | Admitting: Family Medicine

## 2017-11-15 DIAGNOSIS — Z3A26 26 weeks gestation of pregnancy: Secondary | ICD-10-CM | POA: Diagnosis not present

## 2017-11-15 DIAGNOSIS — O99352 Diseases of the nervous system complicating pregnancy, second trimester: Secondary | ICD-10-CM

## 2017-11-15 DIAGNOSIS — G40909 Epilepsy, unspecified, not intractable, without status epilepticus: Secondary | ICD-10-CM

## 2017-11-15 DIAGNOSIS — O43192 Other malformation of placenta, second trimester: Secondary | ICD-10-CM | POA: Insufficient documentation

## 2017-11-15 DIAGNOSIS — O43199 Other malformation of placenta, unspecified trimester: Secondary | ICD-10-CM | POA: Diagnosis present

## 2017-11-15 DIAGNOSIS — O09292 Supervision of pregnancy with other poor reproductive or obstetric history, second trimester: Secondary | ICD-10-CM

## 2017-11-15 DIAGNOSIS — Z362 Encounter for other antenatal screening follow-up: Secondary | ICD-10-CM | POA: Diagnosis not present

## 2017-11-16 ENCOUNTER — Other Ambulatory Visit (HOSPITAL_COMMUNITY): Payer: Self-pay | Admitting: *Deleted

## 2017-11-16 DIAGNOSIS — Z362 Encounter for other antenatal screening follow-up: Secondary | ICD-10-CM

## 2017-12-23 LAB — OB RESULTS CONSOLE RPR: RPR: NONREACTIVE

## 2017-12-27 ENCOUNTER — Ambulatory Visit (HOSPITAL_COMMUNITY)
Admission: RE | Admit: 2017-12-27 | Discharge: 2017-12-27 | Disposition: A | Discharge status: Home / Self Care | Payer: Medicaid Other | Source: Ambulatory Visit | Attending: Obstetrics and Gynecology | Admitting: Obstetrics and Gynecology

## 2017-12-27 DIAGNOSIS — O43193 Other malformation of placenta, third trimester: Secondary | ICD-10-CM

## 2017-12-27 DIAGNOSIS — G40909 Epilepsy, unspecified, not intractable, without status epilepticus: Secondary | ICD-10-CM | POA: Diagnosis not present

## 2017-12-27 DIAGNOSIS — Z362 Encounter for other antenatal screening follow-up: Secondary | ICD-10-CM | POA: Insufficient documentation

## 2017-12-27 DIAGNOSIS — O99353 Diseases of the nervous system complicating pregnancy, third trimester: Secondary | ICD-10-CM | POA: Diagnosis not present

## 2017-12-27 DIAGNOSIS — Z3A32 32 weeks gestation of pregnancy: Secondary | ICD-10-CM | POA: Diagnosis not present

## 2017-12-28 ENCOUNTER — Other Ambulatory Visit (HOSPITAL_COMMUNITY): Payer: Self-pay | Admitting: *Deleted

## 2017-12-28 DIAGNOSIS — O43193 Other malformation of placenta, third trimester: Secondary | ICD-10-CM

## 2018-01-23 LAB — OB RESULTS CONSOLE GBS: STREP GROUP B AG: POSITIVE

## 2018-01-24 ENCOUNTER — Ambulatory Visit (HOSPITAL_COMMUNITY)
Admission: RE | Admit: 2018-01-24 | Discharge: 2018-01-24 | Disposition: A | Discharge status: Home / Self Care | Payer: Medicaid Other | Source: Ambulatory Visit | Attending: Obstetrics and Gynecology | Admitting: Obstetrics and Gynecology

## 2018-01-24 DIAGNOSIS — O09293 Supervision of pregnancy with other poor reproductive or obstetric history, third trimester: Secondary | ICD-10-CM

## 2018-01-24 DIAGNOSIS — Z3A36 36 weeks gestation of pregnancy: Secondary | ICD-10-CM | POA: Diagnosis not present

## 2018-01-24 DIAGNOSIS — O43193 Other malformation of placenta, third trimester: Secondary | ICD-10-CM | POA: Insufficient documentation

## 2018-01-28 ENCOUNTER — Emergency Department
Admission: EM | Admit: 2018-01-28 | Discharge: 2018-01-28 | Disposition: A | Discharge status: Home / Self Care | Payer: Medicaid Other | Attending: Emergency Medicine | Admitting: Emergency Medicine

## 2018-01-28 ENCOUNTER — Other Ambulatory Visit: Payer: Self-pay

## 2018-01-28 ENCOUNTER — Emergency Department: Payer: Medicaid Other

## 2018-01-28 DIAGNOSIS — J208 Acute bronchitis due to other specified organisms: Secondary | ICD-10-CM | POA: Diagnosis not present

## 2018-01-28 DIAGNOSIS — Z79899 Other long term (current) drug therapy: Secondary | ICD-10-CM | POA: Insufficient documentation

## 2018-01-28 DIAGNOSIS — Z87891 Personal history of nicotine dependence: Secondary | ICD-10-CM | POA: Diagnosis not present

## 2018-01-28 DIAGNOSIS — O99513 Diseases of the respiratory system complicating pregnancy, third trimester: Secondary | ICD-10-CM | POA: Insufficient documentation

## 2018-01-28 DIAGNOSIS — Z3A37 37 weeks gestation of pregnancy: Secondary | ICD-10-CM | POA: Insufficient documentation

## 2018-01-28 DIAGNOSIS — B349 Viral infection, unspecified: Secondary | ICD-10-CM

## 2018-01-28 LAB — GROUP A STREP BY PCR: Group A Strep by PCR: NOT DETECTED

## 2018-01-28 MED ORDER — ALBUTEROL SULFATE HFA 108 (90 BASE) MCG/ACT IN AERS: 2.0000 | INHALATION_SPRAY | RESPIRATORY_TRACT | 0 refills | Status: DC | PRN

## 2018-01-28 MED ORDER — DEXTROMETHORPHAN HBR 15 MG/5ML PO SYRP: 10.0000 mL | ORAL_SOLUTION | Freq: Four times a day (QID) | ORAL | 0 refills | Status: DC | PRN

## 2018-01-28 MED ORDER — PREDNISONE 50 MG PO TABS: 50.0000 mg | ORAL_TABLET | Freq: Every day | ORAL | 0 refills | Status: DC

## 2018-01-28 NOTE — ED Triage Notes (Signed)
37 weeks, NO pregnancy c/o  Cough, congestion, sore throat x 3 days   A&O, ambulatory.

## 2018-01-28 NOTE — ED Provider Notes (Signed)
[facility] Emergency Department Provider Note  ____________________________________________  Time seen: Approximately 5:57 PM  I have reviewed the triage vital signs and the nursing notes.   HISTORY  Chief Complaint Nasal Congestion and Cough    HPI Veronica Brooks is a 22 y.o. female who presents the emergency department complaining of sore throat, cough.  Patient presents the emergency department with 3-day history of sore throat.  Patient reports that symptoms began with just a sore throat with no other URI symptoms.  This is been ongoing x3 days.  Today, patient woke up with a cough, that is described as dry in nature.  Patient has had posttussive emesis after one episode of coughing.  Patient endorses some mild nasal congestion but no ear pain.  She is felt "warm as well as some chills" but denies any frank fever.  Patient has been taking cough drops but no other medication for same.  Patient has [redacted] weeks pregnant but denies any abdominal pain, vaginal bleeding or discharge.  She denies any pregnancy complaints at this time.  Patient denies any flank pain, dysuria, polyuria, hematuria.    Past Medical History:  Diagnosis Date  . Bronchitis   . Depression   . Heart murmur    Does not require cardiologist  . Medical history non-contributory     Patient Active Problem List   Diagnosis Date Noted  . Severe recurrent major depression without psychotic features (HCC) 03/10/2016  . Acetaminophen overdose, intentional self-harm, initial encounter (HCC) 03/08/2016  . Depression 03/08/2016  . Suicidal behavior 03/08/2016  . Tylenol toxicity 03/08/2016  . Seizure disorder (HCC) 12/19/2014  . Hypotension 12/19/2014  . Hypokalemia 12/19/2014  . UTI (urinary tract infection) 12/19/2014  . Postictal headache 12/19/2014  . Headache 12/19/2014  . Pregnancy 03/23/2014  . UTI (urinary tract infection) during pregnancy 03/21/2014  . Suspected macroscopic fetus  03/21/2014  . Fetal macrosomia   . High risk teen pregnancy in third trimester 02/14/2014    Past Surgical History:  Procedure Laterality Date  . NO PAST SURGERIES      Prior to Admission medications   Medication Sig Start Date End Date Taking? Authorizing Provider  albuterol (PROVENTIL HFA;VENTOLIN HFA) 108 (90 BASE) MCG/ACT inhaler Inhale 2 puffs into the lungs every 6 (six) hours as needed for wheezing or shortness of breath.    [provider]  albuterol (PROVENTIL HFA;VENTOLIN HFA) 108 (90 Base) MCG/ACT inhaler Inhale 2 puffs into the lungs every 4 (four) hours as needed for wheezing or shortness of breath. 01/28/18   Cuthriell, Delorise RoyalsJonathan D, PA-C  cephALEXin (KEFLEX) 500 MG capsule Take 1 capsule (500 mg total) by mouth every 6 (six) hours. 11/11/17   Aviva SignsWilliams, Marie L, CNM  dextromethorphan 15 MG/5ML syrup Take 10 mLs (30 mg total) by mouth 4 (four) times daily as needed for cough. 01/28/18   Cuthriell, Delorise RoyalsJonathan D, PA-C  famotidine (PEPCID) 40 MG tablet Take 1 tablet (40 mg total) by mouth every evening. 07/09/17 07/09/18  Rebecka ApleyWebster, Allison P, MD  levETIRAcetam (KEPPRA) 500 MG tablet Take 1 tablet (500 mg total) by mouth 2 (two) times daily. 03/11/16   Pucilowska, Jolanta B, MD  metoCLOPramide (REGLAN) 10 MG tablet Take 1 tablet (10 mg total) by mouth every 8 (eight) hours as needed. 07/09/17   Rebecka ApleyWebster, Allison P, MD  predniSONE (DELTASONE) 50 MG tablet Take 1 tablet (50 mg total) by mouth daily with breakfast. 01/28/18   Cuthriell, Delorise RoyalsJonathan D, PA-C  sertraline (ZOLOFT) 50 MG tablet  Take 1 tablet (50 mg total) by mouth daily. 03/11/16   Pucilowska, Ellin Goodie, MD    Allergies Latex  Family History  Problem Relation Age of Onset  . Diabetes Maternal Grandmother   . Cancer Maternal Grandfather     Social History Social History   Tobacco Use  . Smoking status: Former Smoker    Types: Cigarettes  . Smokeless tobacco: Never Used  Substance Use Topics  . Alcohol use: No  .  Drug use: No     Review of Systems  Constitutional: No fever/chills Eyes: No visual changes. No discharge ENT: Mild nasal congestion.  Positive sore throat. Cardiovascular: no chest pain. Respiratory: Positive cough. SOB only with coughing spells. Gastrointestinal: No abdominal pain.  No nausea, no vomiting.  No diarrhea.  No constipation. Genitourinary: Negative for dysuria. No hematuria.  No vaginal bleeding or discharge. Musculoskeletal: Negative for musculoskeletal pain. Skin: Negative for rash, abrasions, lacerations, ecchymosis. Neurological: Negative for headaches, focal weakness or numbness. 10-point ROS otherwise negative.  ____________________________________________   PHYSICAL EXAM:  VITAL SIGNS: ED Triage Vitals [01/28/18 1729]  Enc Vitals Group     BP (!) 155/127     Pulse Rate 97     Resp 18     Temp 98.3 F (36.8 C)     Temp Source Oral     SpO2 99 %     Weight 140 lb (63.5 kg)     Height 5\' 1"  (1.549 m)     Head Circumference      Peak Flow      Pain Score 0     Pain Loc      Pain Edu?      Excl. in GC?      Constitutional: Alert and oriented. Well appearing and in no acute distress. Eyes: Conjunctivae are normal. PERRL. EOMI. Head: Atraumatic. ENT:      Ears: TMs are mildly bulging bilaterally.  EACs are unremarkable bilaterally.      Nose: Moderate clear congestion/rhinnorhea.      Mouth/Throat: Mucous membranes are moist.  Oropharynx is erythematous but not grossly edematous.  Tonsils are erythematous and mildly edematous.  No exudates.  Uvula is midline. Neck: No stridor.  Neck is supple full range of motion Hematological/Lymphatic/Immunilogical: Diffuse, mobile, mildly tender anterior cervical lymphadenopathy Cardiovascular: Normal rate, regular rhythm. Normal S1 and S2.  Good peripheral circulation. Respiratory: Normal respiratory effort without tachypnea or retractions. Lungs with bilateral coarse expiratory sounds.  Bilateral expiratory  wheezes in the lower lung fields.  No rales or rhonchi.Peri Jefferson air entry to the bases with no decreased or absent breath sounds. Gastrointestinal: Visibly gravid abdomen.  Bowel sounds 4 quadrants. Soft and nontender to palpation. No guarding or rigidity. No palpable masses. No distention. No CVA tenderness. Musculoskeletal: Full range of motion to all extremities. No gross deformities appreciated. Neurologic:  Normal speech and language. No gross focal neurologic deficits are appreciated.  Skin:  Skin is warm, dry and intact. No rash noted. Psychiatric: Mood and affect are normal. Speech and behavior are normal. Patient exhibits appropriate insight and judgement.   ____________________________________________   LABS (all labs ordered are listed, but only abnormal results are displayed)  Labs Reviewed  GROUP A STREP BY PCR   ____________________________________________  EKG   ____________________________________________  RADIOLOGY I personally viewed and evaluated these images as part of my medical decision making, as well as reviewing the written report by the radiologist.  Dg Chest 2 View  Result Date: 01/28/2018 CLINICAL  DATA:  Cough, congestion and shortness of breath since 01/24/2018. EXAM: CHEST - 2 VIEW COMPARISON:  PA and lateral chest 04/29/2012. FINDINGS: The lungs are clear. Heart size is normal. No pneumothorax or pleural fluid. No acute or focal bony abnormality. Mild scoliosis noted. IMPRESSION: No acute disease. Electronically Signed   By: Drusilla Kanner M.D.   On: 01/28/2018 18:22    ____________________________________________    PROCEDURES  Procedure(s) performed:    Procedures    Medications - No data to display   ____________________________________________   INITIAL IMPRESSION / ASSESSMENT AND PLAN / ED COURSE  Pertinent labs & imaging results that were available during my care of the patient were reviewed by me and considered in my medical  decision making (see chart for details).  Review of the [place] CSRS was performed in accordance of the [facility] prior to dispensing any controlled drugs.  Clinical Course as of Jan 28 1901  Caleen Essex Jan 28, 2018  1800 Patient presents the emergency department [redacted] weeks pregnant.  No pregnancy complaints.  Patient has had sore throat x3 days, developed cough, nasal congestion, posttussive emesis today.  On exam, symptoms appear viral with viral bronchitis.  However, I will test for strep as well as perform chest x-ray to ensure no pneumonia.  Differential includes viral URI, strep, bronchitis, pneumonia.  Patient again denies any pregnancy complaints at this time.   [JC]    Clinical Course User Index [JC] Cuthriell, Delorise Royals, PA-C     Patient's diagnosis is consistent with viral illness with bronchitis in a pregnant patient.  Patient presents the emergency department with sore throat, nasal congestion, cough x3 days.  Overall, exam is reassuring.  Negative strep test and an x-ray reveals no consolidation concerning for pneumonia.  Patient was treated for viral illness and viral bronchitis.  Patient is given prescription for prednisone and albuterol for bronchitis as well as dextromethorphan for cough suppression..  Follow-up with primary care or OB/GYN as needed.  Patient is given ED precautions to return to the ED for any worsening or new symptoms.     ____________________________________________  FINAL CLINICAL IMPRESSION(S) / ED DIAGNOSES  Final diagnoses:  Viral illness  Acute viral bronchitis  [redacted] weeks gestation of pregnancy      NEW MEDICATIONS STARTED DURING THIS VISIT:  ED Discharge Orders         Ordered    predniSONE (DELTASONE) 50 MG tablet  Daily with breakfast     01/28/18 1859    albuterol (PROVENTIL HFA;VENTOLIN HFA) 108 (90 Base) MCG/ACT inhaler  Every 4 hours PRN     01/28/18 1859    dextromethorphan 15 MG/5ML syrup  4 times daily PRN     01/28/18 1859               This chart was dictated using voice recognition software/Dragon. Despite best efforts to proofread, errors can occur which can change the meaning. Any change was purely unintentional.    Racheal Patches, PA-C 01/28/18 1902    Arnaldo Natal, MD 01/29/18 [phone]

## 2018-01-28 NOTE — ED Notes (Signed)
Patient presents to the ED with nasal congestion, cough and sore throat since Tuesday.  Patient reports vomiting mucous occasionally after long coughing fits.

## 2018-02-12 ENCOUNTER — Inpatient Hospital Stay (HOSPITAL_COMMUNITY)
Admission: AD | Admit: 2018-02-12 | Discharge: 2018-02-12 | Disposition: A | Discharge status: Home / Self Care | Payer: Medicaid Other | Source: Ambulatory Visit | Attending: Family Medicine | Admitting: Family Medicine

## 2018-02-12 ENCOUNTER — Other Ambulatory Visit: Payer: Self-pay

## 2018-02-12 ENCOUNTER — Encounter (HOSPITAL_COMMUNITY): Payer: Self-pay

## 2018-02-12 DIAGNOSIS — Z3A39 39 weeks gestation of pregnancy: Secondary | ICD-10-CM

## 2018-02-12 DIAGNOSIS — O479 False labor, unspecified: Secondary | ICD-10-CM

## 2018-02-12 NOTE — MAU Note (Signed)
Pt states ctx have been irregular since she had her membranes swept a few days ago.  Pt denies LOF but reports some mucous like dc.  Pt feeling fetal movement.

## 2018-02-12 NOTE — MAU Note (Signed)
I have communicated with Dr. Earlene PlaterWallace and reviewed vital signs:  Vitals:   02/12/18 1355 02/12/18 1550  BP: 133/69 124/62  Pulse: 94 91  Resp: 20 18  Temp: 97.9 F (36.6 C)   SpO2: 99%     Vaginal exam:  Dilation: 3 Effacement (%): 40 Cervical Position: Posterior Station: -2 Presentation: Undeterminable Exam by:: Marvel PlanJessica Rima Blizzard RN ,   Also reviewed contraction pattern and that non-stress test is reactive.  It has been documented that patient is contracting irregularly with no cervical change over 1 hours not indicating active labor.  Patient denies any other complaints.  Based on this report provider has given order for discharge.  A discharge order and diagnosis entered by a provider.   Labor discharge instructions reviewed with patient.

## 2018-02-12 NOTE — Discharge Instructions (Signed)

## 2018-02-13 ENCOUNTER — Inpatient Hospital Stay (HOSPITAL_COMMUNITY): Payer: Medicaid Other | Admitting: Anesthesiology

## 2018-02-13 ENCOUNTER — Inpatient Hospital Stay (HOSPITAL_COMMUNITY)
Admission: AD | Admit: 2018-02-13 | Discharge: 2018-02-15 | DRG: 806 | Disposition: A | Discharge status: Home / Self Care | Payer: Medicaid Other | Attending: Obstetrics & Gynecology | Admitting: Obstetrics & Gynecology

## 2018-02-13 ENCOUNTER — Encounter (HOSPITAL_COMMUNITY): Payer: Self-pay

## 2018-02-13 DIAGNOSIS — F332 Major depressive disorder, recurrent severe without psychotic features: Secondary | ICD-10-CM | POA: Diagnosis present

## 2018-02-13 DIAGNOSIS — O43123 Velamentous insertion of umbilical cord, third trimester: Secondary | ICD-10-CM | POA: Diagnosis present

## 2018-02-13 DIAGNOSIS — D649 Anemia, unspecified: Secondary | ICD-10-CM | POA: Diagnosis present

## 2018-02-13 DIAGNOSIS — O99824 Streptococcus B carrier state complicating childbirth: Secondary | ICD-10-CM | POA: Diagnosis present

## 2018-02-13 DIAGNOSIS — Z3A39 39 weeks gestation of pregnancy: Secondary | ICD-10-CM

## 2018-02-13 DIAGNOSIS — O9902 Anemia complicating childbirth: Secondary | ICD-10-CM | POA: Diagnosis present

## 2018-02-13 DIAGNOSIS — G40909 Epilepsy, unspecified, not intractable, without status epilepticus: Secondary | ICD-10-CM | POA: Diagnosis present

## 2018-02-13 DIAGNOSIS — O99354 Diseases of the nervous system complicating childbirth: Secondary | ICD-10-CM | POA: Diagnosis present

## 2018-02-13 DIAGNOSIS — O99344 Other mental disorders complicating childbirth: Secondary | ICD-10-CM | POA: Diagnosis present

## 2018-02-13 DIAGNOSIS — Z3490 Encounter for supervision of normal pregnancy, unspecified, unspecified trimester: Secondary | ICD-10-CM

## 2018-02-13 DIAGNOSIS — Z87891 Personal history of nicotine dependence: Secondary | ICD-10-CM

## 2018-02-13 DIAGNOSIS — F329 Major depressive disorder, single episode, unspecified: Secondary | ICD-10-CM | POA: Diagnosis present

## 2018-02-13 DIAGNOSIS — F32A Depression, unspecified: Secondary | ICD-10-CM | POA: Diagnosis present

## 2018-02-13 HISTORY — DX: Poisoning by 4-aminophenol derivatives, intentional self-harm, initial encounter: T39.1X2A

## 2018-02-13 LAB — CBC
HCT: 30.3 % — ABNORMAL LOW (ref 36.0–46.0)
Hemoglobin: 9.4 g/dL — ABNORMAL LOW (ref 12.0–15.0)
MCH: 28 pg (ref 26.0–34.0)
MCHC: 31 g/dL (ref 30.0–36.0)
MCV: 90.2 fL (ref 80.0–100.0)
NRBC: 0 % (ref 0.0–0.2)
Platelets: 264 10*3/uL (ref 150–400)
RBC: 3.36 MIL/uL — AB (ref 3.87–5.11)
RDW: 15 % (ref 11.5–15.5)
WBC: 15.6 10*3/uL — ABNORMAL HIGH (ref 4.0–10.5)

## 2018-02-13 LAB — TYPE AND SCREEN
ABO/RH(D): O POS
Antibody Screen: NEGATIVE

## 2018-02-13 LAB — RPR: RPR: NONREACTIVE

## 2018-02-13 MED ORDER — ACETAMINOPHEN 325 MG PO TABS
650.0000 mg | ORAL_TABLET | ORAL | Status: DC | PRN
  Administered 2018-02-13: 650 mg via ORAL
  Filled 2018-02-13: qty 2

## 2018-02-13 MED ORDER — LEVETIRACETAM 500 MG PO TABS
500.0000 mg | ORAL_TABLET | Freq: Two times a day (BID) | ORAL | Status: DC
  Filled 2018-02-13 (×6): qty 1

## 2018-02-13 MED ORDER — LIDOCAINE HCL (PF) 1 % IJ SOLN
30.0000 mL | INTRAMUSCULAR | Status: DC | PRN
  Filled 2018-02-13: qty 30

## 2018-02-13 MED ORDER — EPHEDRINE 5 MG/ML INJ: 10.0000 mg | INTRAVENOUS | Status: DC | PRN

## 2018-02-13 MED ORDER — IBUPROFEN 600 MG PO TABS
600.0000 mg | ORAL_TABLET | Freq: Four times a day (QID) | ORAL | Status: DC
  Administered 2018-02-13 – 2018-02-15 (×8): 600 mg via ORAL
  Filled 2018-02-13 (×9): qty 1

## 2018-02-13 MED ORDER — OXYTOCIN 40 UNITS IN LACTATED RINGERS INFUSION - SIMPLE MED
1.0000 m[IU]/min | INTRAVENOUS | Status: DC
  Administered 2018-02-13: 2 m[IU]/min via INTRAVENOUS
  Filled 2018-02-13: qty 1000

## 2018-02-13 MED ORDER — FENTANYL 2.5 MCG/ML BUPIVACAINE 1/10 % EPIDURAL INFUSION (WH - ANES)
14.0000 mL/h | INTRAMUSCULAR | Status: DC | PRN
  Administered 2018-02-13: 14 mL/h via EPIDURAL
  Filled 2018-02-13: qty 100

## 2018-02-13 MED ORDER — LACTATED RINGERS IV SOLN
500.0000 mL | INTRAVENOUS | Status: DC | PRN
  Administered 2018-02-13: 500 mL via INTRAVENOUS

## 2018-02-13 MED ORDER — EPHEDRINE 5 MG/ML INJ
10.0000 mg | INTRAVENOUS | Status: DC | PRN
  Filled 2018-02-13: qty 2

## 2018-02-13 MED ORDER — LACTATED RINGERS IV SOLN
INTRAVENOUS | Status: DC
  Administered 2018-02-13 (×3): via INTRAVENOUS

## 2018-02-13 MED ORDER — SOD CITRATE-CITRIC ACID 500-334 MG/5ML PO SOLN: 30.0000 mL | ORAL | Status: DC | PRN

## 2018-02-13 MED ORDER — OXYCODONE-ACETAMINOPHEN 5-325 MG PO TABS: 2.0000 | ORAL_TABLET | ORAL | Status: DC | PRN

## 2018-02-13 MED ORDER — SENNOSIDES-DOCUSATE SODIUM 8.6-50 MG PO TABS
2.0000 | ORAL_TABLET | ORAL | Status: DC
  Administered 2018-02-14 – 2018-02-15 (×2): 2 via ORAL
  Filled 2018-02-13 (×3): qty 2

## 2018-02-13 MED ORDER — SIMETHICONE 80 MG PO CHEW: 80.0000 mg | CHEWABLE_TABLET | ORAL | Status: DC | PRN

## 2018-02-13 MED ORDER — LACTATED RINGERS IV SOLN: 500.0000 mL | Freq: Once | INTRAVENOUS | Status: DC

## 2018-02-13 MED ORDER — LIDOCAINE HCL (PF) 1 % IJ SOLN
INTRAMUSCULAR | Status: DC | PRN
  Administered 2018-02-13: 2 mL via EPIDURAL
  Administered 2018-02-13: 5 mL via EPIDURAL
  Administered 2018-02-13: 3 mL via EPIDURAL

## 2018-02-13 MED ORDER — ONDANSETRON HCL 4 MG PO TABS: 4.0000 mg | ORAL_TABLET | ORAL | Status: DC | PRN

## 2018-02-13 MED ORDER — WITCH HAZEL-GLYCERIN EX PADS: 1.0000 "application " | MEDICATED_PAD | CUTANEOUS | Status: DC | PRN

## 2018-02-13 MED ORDER — LACTATED RINGERS IV SOLN
Freq: Once | INTRAVENOUS | Status: AC
  Administered 2018-02-13: 04:00:00 via INTRAVENOUS

## 2018-02-13 MED ORDER — DIPHENHYDRAMINE HCL 50 MG/ML IJ SOLN: 12.5000 mg | INTRAMUSCULAR | Status: DC | PRN

## 2018-02-13 MED ORDER — ALBUTEROL SULFATE (2.5 MG/3ML) 0.083% IN NEBU: 2.5000 mg | INHALATION_SOLUTION | RESPIRATORY_TRACT | Status: DC | PRN

## 2018-02-13 MED ORDER — DIBUCAINE 1 % RE OINT: 1.0000 "application " | TOPICAL_OINTMENT | RECTAL | Status: DC | PRN

## 2018-02-13 MED ORDER — PHENYLEPHRINE 40 MCG/ML (10ML) SYRINGE FOR IV PUSH (FOR BLOOD PRESSURE SUPPORT)
80.0000 ug | PREFILLED_SYRINGE | INTRAVENOUS | Status: DC | PRN
  Filled 2018-02-13 (×2): qty 10

## 2018-02-13 MED ORDER — OXYCODONE-ACETAMINOPHEN 5-325 MG PO TABS: 1.0000 | ORAL_TABLET | ORAL | Status: DC | PRN

## 2018-02-13 MED ORDER — PRENATAL MULTIVITAMIN CH
1.0000 | ORAL_TABLET | Freq: Every day | ORAL | Status: DC
  Administered 2018-02-14 – 2018-02-15 (×2): 1 via ORAL
  Filled 2018-02-13 (×2): qty 1

## 2018-02-13 MED ORDER — COCONUT OIL OIL: 1.0000 "application " | TOPICAL_OIL | Status: DC | PRN

## 2018-02-13 MED ORDER — ONDANSETRON HCL 4 MG/2ML IJ SOLN: 4.0000 mg | Freq: Four times a day (QID) | INTRAMUSCULAR | Status: DC | PRN

## 2018-02-13 MED ORDER — ONDANSETRON HCL 4 MG/2ML IJ SOLN: 4.0000 mg | INTRAMUSCULAR | Status: DC | PRN

## 2018-02-13 MED ORDER — PHENYLEPHRINE 40 MCG/ML (10ML) SYRINGE FOR IV PUSH (FOR BLOOD PRESSURE SUPPORT)
80.0000 ug | PREFILLED_SYRINGE | INTRAVENOUS | Status: DC | PRN
  Filled 2018-02-13: qty 10

## 2018-02-13 MED ORDER — TERBUTALINE SULFATE 1 MG/ML IJ SOLN
0.2500 mg | Freq: Once | INTRAMUSCULAR | Status: DC | PRN
  Filled 2018-02-13: qty 1

## 2018-02-13 MED ORDER — MEASLES, MUMPS & RUBELLA VAC IJ SOLR
0.5000 mL | Freq: Once | INTRAMUSCULAR | Status: AC
  Filled 2018-02-13: qty 0.5

## 2018-02-13 MED ORDER — TETANUS-DIPHTH-ACELL PERTUSSIS 5-2.5-18.5 LF-MCG/0.5 IM SUSP: 0.5000 mL | Freq: Once | INTRAMUSCULAR | Status: DC

## 2018-02-13 MED ORDER — PENICILLIN G 3 MILLION UNITS IVPB - SIMPLE MED
3.0000 10*6.[IU] | INTRAVENOUS | Status: DC
  Administered 2018-02-13: 3 10*6.[IU] via INTRAVENOUS
  Filled 2018-02-13 (×3): qty 100

## 2018-02-13 MED ORDER — BENZOCAINE-MENTHOL 20-0.5 % EX AERO: 1.0000 "application " | INHALATION_SPRAY | CUTANEOUS | Status: DC | PRN

## 2018-02-13 MED ORDER — OXYTOCIN 40 UNITS IN LACTATED RINGERS INFUSION - SIMPLE MED
2.5000 [IU]/h | INTRAVENOUS | Status: DC
  Administered 2018-02-13: 2.5 [IU]/h via INTRAVENOUS

## 2018-02-13 MED ORDER — LACTATED RINGERS IV SOLN
500.0000 mL | Freq: Once | INTRAVENOUS | Status: AC
  Administered 2018-02-13: 500 mL via INTRAVENOUS

## 2018-02-13 MED ORDER — OXYTOCIN BOLUS FROM INFUSION
500.0000 mL | Freq: Once | INTRAVENOUS | Status: AC
  Administered 2018-02-13: 500 mL via INTRAVENOUS

## 2018-02-13 MED ORDER — DIPHENHYDRAMINE HCL 25 MG PO CAPS: 25.0000 mg | ORAL_CAPSULE | Freq: Four times a day (QID) | ORAL | Status: DC | PRN

## 2018-02-13 MED ORDER — PHENYLEPHRINE 40 MCG/ML (10ML) SYRINGE FOR IV PUSH (FOR BLOOD PRESSURE SUPPORT): 80.0000 ug | PREFILLED_SYRINGE | INTRAVENOUS | Status: DC | PRN

## 2018-02-13 MED ORDER — FAMOTIDINE 20 MG PO TABS: 40.0000 mg | ORAL_TABLET | Freq: Every day | ORAL | Status: DC | PRN

## 2018-02-13 MED ORDER — SODIUM CHLORIDE 0.9 % IV SOLN
5.0000 10*6.[IU] | Freq: Once | INTRAVENOUS | Status: AC
  Administered 2018-02-13: 5 10*6.[IU] via INTRAVENOUS
  Filled 2018-02-13: qty 5

## 2018-02-13 MED ORDER — ACETAMINOPHEN 325 MG PO TABS: 650.0000 mg | ORAL_TABLET | ORAL | Status: DC | PRN

## 2018-02-13 NOTE — Anesthesia Procedure Notes (Signed)
Epidural Patient location during procedure: OB Start time: 02/13/2018 10:17 AM End time: 02/13/2018 10:24 AM  Staffing Anesthesiologist: Marcene DuosFitzgerald, Milind Raether, MD Performed: anesthesiologist   Preanesthetic Checklist Completed: patient identified, site marked, surgical consent, pre-op evaluation, timeout performed, IV checked, risks and benefits discussed and monitors and equipment checked  Epidural Patient position: sitting Prep: site prepped and draped and DuraPrep Patient monitoring: continuous pulse ox and blood pressure Approach: midline Location: L4-L5 Injection technique: LOR air  Needle:  Needle type: Tuohy  Needle gauge: 17 G Needle length: 9 cm and 9 Needle insertion depth: 7 cm Catheter type: closed end flexible Catheter size: 19 Gauge Catheter at skin depth: 12 cm Test dose: negative  Assessment Events: blood not aspirated, injection not painful, no injection resistance, negative IV test and no paresthesia

## 2018-02-13 NOTE — Anesthesia Preprocedure Evaluation (Signed)
Anesthesia Evaluation  Patient identified by MRN, date of birth, ID band Patient awake    Reviewed: Allergy & Precautions, Patient's Chart, lab work & pertinent test results  Airway Mallampati: II  TM Distance: >3 FB     Dental   Pulmonary former smoker,    breath sounds clear to auscultation       Cardiovascular negative cardio ROS   Rhythm:Regular Rate:Normal     Neuro/Psych Seizures -, Well Controlled,     GI/Hepatic negative GI ROS, Neg liver ROS,   Endo/Other  negative endocrine ROS  Renal/GU negative Renal ROS     Musculoskeletal   Abdominal   Peds  Hematology  (+) anemia ,   Anesthesia Other Findings   Reproductive/Obstetrics (+) Pregnancy                             Lab Results  Component Value Date   WBC 15.6 (H) 02/13/2018   HGB 9.4 (L) 02/13/2018   HCT 30.3 (L) 02/13/2018   MCV 90.2 02/13/2018   PLT 264 02/13/2018    Anesthesia Physical Anesthesia Plan  ASA: II  Anesthesia Plan: Epidural   Post-op Pain Management:    Induction:   PONV Risk Score and Plan: Treatment may vary due to age or medical condition  Airway Management Planned: Natural Airway  Additional Equipment:   Intra-op Plan:   Post-operative Plan:   Informed Consent: I have reviewed the patients History and Physical, chart, labs and discussed the procedure including the risks, benefits and alternatives for the proposed anesthesia with the patient or authorized representative who has indicated his/her understanding and acceptance.     Plan Discussed with:   Anesthesia Plan Comments:         Anesthesia Quick Evaluation

## 2018-02-13 NOTE — H&P (Signed)
OBSTETRIC ADMISSION HISTORY AND PHYSICAL  Veronica Brooks is a 22 y.o. female G20P2002 with IUP at [redacted]w[redacted]d by L/20 presenting for early labor. Was supposed to deliver at a different hospital but preferred to come here. Describes pains as crampy, has been hurting for 3-4 days.   Reports fetal movement. Denies vaginal bleeding, leakage of fluids.   She received her prenatal care at outside clinic in [place], [facility] Support person in labor: partner  Ultrasounds . 20w6: normal anatomy U/S though some limited views, marginal cord insertion noted  . 23w2: normal interval U/S in setting of vaginal bleeding  . 32w6: EWF 1805g (34%) . 36w6: 2847g (51%)  Prenatal History/Complications: . History of depression with SI, suicide attempt in 2018 with acetaminophen . Vaginal bleeding in pregnancy - asymptomatic, admitted and monitored . Velamentous insertion of umbilical cord   Past Medical History: Past Medical History:  Diagnosis Date  . Acetaminophen overdose, intentional self-harm, initial encounter (HCC) 03/08/2016  . Bronchitis   . Depression   . Heart murmur    Does not require cardiologist  . Medical history non-contributory     Past Surgical History: Past Surgical History:  Procedure Laterality Date  . NO PAST SURGERIES      Obstetrical History: OB History    Gravida  3   Para  2   Term  2   Preterm      AB      Living  2     SAB      TAB      Ectopic      Multiple  0   Live Births  2           Social History: Social History   Socioeconomic History  . Marital status: Single    Spouse name: Not on file  . Number of children: Not on file  . Years of education: Not on file  . Highest education level: Not on file  Occupational History  . Not on file  Social Needs  . Financial resource strain: Not on file  . Food insecurity:    Worry: Not on file    Inability: Not on file  . Transportation needs:    Medical: Not on file     Non-medical: Not on file  Tobacco Use  . Smoking status: Former Smoker    Types: Cigarettes  . Smokeless tobacco: Never Used  Substance and Sexual Activity  . Alcohol use: No  . Drug use: No  . Sexual activity: Yes    Birth control/protection: None  Lifestyle  . Physical activity:    Days per week: Not on file    Minutes per session: Not on file  . Stress: Not on file  Relationships  . Social connections:    Talks on phone: Not on file    Gets together: Not on file    Attends religious service: Not on file    Active member of club or organization: Not on file    Attends meetings of clubs or organizations: Not on file    Relationship status: Not on file  Other Topics Concern  . Not on file  Social History Narrative  . Not on file    Family History: Family History  Problem Relation Age of Onset  . Diabetes Maternal Grandmother   . Cancer Maternal Grandfather     Allergies: Allergies  Allergen Reactions  . Latex Rash and Other (See Comments)    Reaction:  Burning  Medications Prior to Admission  Medication Sig Dispense Refill Last Dose  . albuterol (PROVENTIL HFA;VENTOLIN HFA) 108 (90 BASE) MCG/ACT inhaler Inhale 2 puffs into the lungs every 6 (six) hours as needed for wheezing or shortness of breath.   Unknown at Unknown time  . albuterol (PROVENTIL HFA;VENTOLIN HFA) 108 (90 Base) MCG/ACT inhaler Inhale 2 puffs into the lungs every 4 (four) hours as needed for wheezing or shortness of breath. 1 Inhaler 0 Unknown at Unknown time  . cephALEXin (KEFLEX) 500 MG capsule Take 1 capsule (500 mg total) by mouth every 6 (six) hours. 28 capsule 2 Unknown at Unknown time  . dextromethorphan 15 MG/5ML syrup Take 10 mLs (30 mg total) by mouth 4 (four) times daily as needed for cough. 120 mL 0 Unknown at Unknown time  . famotidine (PEPCID) 40 MG tablet Take 1 tablet (40 mg total) by mouth every evening. 14 tablet 0 Unknown at Unknown time  . levETIRAcetam (KEPPRA) 500 MG  tablet Take 1 tablet (500 mg total) by mouth 2 (two) times daily. 60 tablet 6 Unknown at Unknown time  . metoCLOPramide (REGLAN) 10 MG tablet Take 1 tablet (10 mg total) by mouth every 8 (eight) hours as needed. 20 tablet 0 Unknown at Unknown time  . predniSONE (DELTASONE) 50 MG tablet Take 1 tablet (50 mg total) by mouth daily with breakfast. 5 tablet 0 Unknown at Unknown time  . sertraline (ZOLOFT) 50 MG tablet Take 1 tablet (50 mg total) by mouth daily. 30 tablet 2 Unknown at Unknown time    Review of Systems  All systems reviewed and negative except as stated in HPI Blood pressure 129/68, pulse 86, temperature 98.8 F (37.1 C), temperature source Oral, resp. rate 18, height 5\' 1"  (1.549 m), weight 64 kg, last menstrual period 05/11/2017, SpO2 98 %, unknown if currently breastfeeding. General appearance: well-appearing, NAD, resting comfortably in room Lungs: no respiratory distress Heart: regular rate  Abdomen: soft, non-tender; gravid Pelvic: deferred Extremities: Homans sign is negative, no sign of DVT Presentation: cephalic Fetal monitoring: 130s/mod/-a/-d Uterine activity: irregular Dilation: 4 Effacement (%): 80(80-90) Station: -2 Exam by:: Veronica Perl RN  Prenatal labs: ABO, Rh: --/--/O POS (12/15 0410) Antibody: PENDING (12/15 0410) Rubella:  immune RPR:   non-reactive HBsAg:   negative HIV:   non-reactive GBS: Positive (11/24 0000)  Glucola: early 1hr normal, repeat normal 102 Genetic screening:  deferred  Prenatal Transfer Tool  Maternal Diabetes: No Genetic Screening: Declined Maternal Ultrasounds/Referrals: Normal Fetal Ultrasounds or other Referrals:  None Maternal Substance Abuse:  denies Significant Maternal Medications: Keppra prior to pregnancy Significant Maternal Lab Results: GBS(+)  Results for orders placed or performed during the hospital encounter of 02/13/18 (from the past 24 hour(s))  CBC   Collection Time: 02/13/18  4:10 AM  Result  Value Ref Range   WBC 15.6 (H) 4.0 - 10.5 K/uL   RBC 3.36 (L) 3.87 - 5.11 MIL/uL   Hemoglobin 9.4 (L) 12.0 - 15.0 g/dL   HCT 40.9 (L) 81.1 - 91.4 %   MCV 90.2 80.0 - 100.0 fL   MCH 28.0 26.0 - 34.0 pg   MCHC 31.0 30.0 - 36.0 g/dL   RDW 78.2 95.6 - 21.3 %   Platelets 264 150 - 400 K/uL   nRBC 0.0 0.0 - 0.2 %  Type and screen [facility]   Collection Time: 02/13/18  4:10 AM  Result Value Ref Range   ABO/RH(D) O POS    Antibody Screen PENDING  Sample Expiration      02/16/2018 Performed at [facility], [address] [place], [place] [address]     Patient Active Problem List   Diagnosis Date Noted  . Severe recurrent major depression without psychotic features (HCC) 03/10/2016  . Depression 03/08/2016  . Suicidal behavior 03/08/2016  . Seizure disorder (HCC) 12/19/2014  . Pregnancy 03/23/2014  . UTI (urinary tract infection) during pregnancy 03/21/2014   Assessment/Plan:  Veronica GoreDeja Brooks is a 22 y.o. G3P2002 at 3762w5d here for early labor. While being monitored in MAU, she had several periods of fetal decelerations and variables and was given an IV bolus. Admitted to L&D for augmentation of labor for periods of Category II strip.   Labor: Expectant management.  -- pain control: would prefer IV pain meds   Fetal Wellbeing: EFW 6lbs by Leopold's. Cephalic by prior RN check, sutures.  -- GBS (positive) -- continuous fetal monitoring - category I    Postpartum Planning -- breast/interval BTL -- RI/[x] Tdap/[x] flu  Veronica Florentino S. Earlene PlaterWallace, DO OB/GYN Fellow

## 2018-02-13 NOTE — Anesthesia Pain Management Evaluation Note (Signed)
  CR[facility] Pain Management Visit Note  Patient: Veronica Brooks, 22 y.o., female  "Hello I am a member of the anesthesia team at [facility]. We have an anesthesia team available at all times to provide care throughout the hospital, including epidural management and anesthesia for C-section. I don't know your plan for the delivery whether it a natural birth, water birth, IV sedation, nitrous supplementation, doula or epidural, but we want to meet your pain goals."   1.Was your pain managed to your expectations on prior hospitalizations?   Yes   2.What is your expectation for pain management during this hospitalization?     Epidural  3.How can we help you reach that goal? Support prn  Record the patient's initial score and the patient's pain goal.   Pain: 3  Pain Goal: 4 The [facility] wants you to be able to say your pain was always managed very well.  [facility]WRINKLE,Ramsie Ostrander 02/13/2018

## 2018-02-13 NOTE — Progress Notes (Signed)
Pt refused Keppra. States she doesn't like how it makes her feel and she has not taken it in over a year anyway. Pt educated on importance of Seizure medication and risk to herself and infant. Pt verbalized understanding. Stated she would talk to MD about it.   Darrick GrinderErin Jacquese Cassarino RN

## 2018-02-13 NOTE — MAU Note (Signed)
Ctxs and bloody show today. Ctxs for several days but stronger today. 3-4cm last sve. Sees Dr [facility]les Drew at [facility]

## 2018-02-14 NOTE — Anesthesia Postprocedure Evaluation (Signed)
Anesthesia Post Note  Patient: Veronica Brooks  Procedure(s) Performed: AN AD HOC LABOR EPIDURAL     Patient location during evaluation: Mother Baby Anesthesia Type: Epidural Level of consciousness: awake and alert and oriented Pain management: satisfactory to patient Vital Signs Assessment: post-procedure vital signs reviewed and stable Respiratory status: respiratory function stable Cardiovascular status: stable Postop Assessment: no headache, no backache, epidural receding, patient able to bend at knees, no signs of nausea or vomiting and adequate PO intake Anesthetic complications: no    Last Vitals:  Vitals:   02/14/18 0030 02/14/18 0433  BP: 124/79 110/67  Pulse: 77 88  Resp: 16 16  Temp: 36.9 C 36.7 C  SpO2: 100% 98%    Last Pain:  Vitals:   02/14/18 0720  TempSrc:   PainSc: 0-No pain   Pain Goal: Patients Stated Pain Goal: 0 (02/13/18 0235)               Karleen DolphinFUSSELL,Nickole Adamek

## 2018-02-14 NOTE — Progress Notes (Addendum)
Post Partum Day 1  S/p SVD 12/15 at 1354 Subjective: no complaints, up at lib. +voiding, reports passing of flatulence   Objective: Blood pressure 110/67, pulse 88, temperature 98.1 F (36.7 C), temperature source Oral, resp. rate 16, height 5\' 1"  (1.549 m), weight 64 kg, last menstrual period 05/11/2017, SpO2 98 %, unknown if currently breastfeeding.  Physical Exam:  General: alert Lochia: appropriate Uterine Fundus: firm DVT Evaluation: No evidence of DVT seen on physical exam.  Recent Labs    02/13/18 0410  HGB 9.4*  HCT 30.3*    Assessment/Plan: Plan for discharge tomorrow  Bottle feeding  BTL Hx seizure d/o: pt reports keppra use prior to pregnancy, declining keppra PP because she says it makes her sleepy and drowsy. Reports last seizure 1 yr ago.    LOS: 1 day   Sigurd SosStephenia M Deloglos 02/14/2018, 6:59 AM   Midwife attestation Post Partum Day 1 I have seen and examined this patient and agree with above documentation in the student's note.   Epifania GoreDeja Blanchette is a 22 y.o. [id number] s/p SVD.  Pt denies problems with ambulating, voiding or po intake. Pain is well controlled. Method of Feeding: bottle Declining Keppra, reports sedation with use and afraid she won't be able to care for her newborn. Has not taken for over a year and report last seizure about 1 yr before that.   PE:  Gen: well appearing Heart: reg rate Lungs: normal WOB Fundus firm Ext: soft, no pain, no edema  Assessment: S/p SVD PPD #1 Seizure disorder  Plan  Recommend restart Keppra while inpatient- will observe for sedation, pt will likely decline; I discussed with her the risk of having a seizure with a newborn in her care Recommend f/u with Neuro outpt- they may be able to offer other anticonvulsants with less SE Discharge: tomorrow  Donette LarryMelanie Marco Raper, CNM 11:26 AM

## 2018-02-14 NOTE — Progress Notes (Signed)
CLINICAL SOCIAL WORK MATERNAL/CHILD NOTE  Patient Details  Name: Veronica Brooks MRN: [id number] Date of Birth: 02/13/2019  Date:  02/14/2018  Clinical Social Worker Initiating Note:  Abundio Miu, [place]   Date/Time: Initiated:  02/14/18/1339             Child's Name:  Veronica Brooks   Biological Parents:  Mother, Father(Father - Madison Hickman (02-07-94))   Need for Interpreter:  None   Reason for Referral:  Behavioral Health Concerns   Address:  [place] [address]    Phone number:  [phone] (home)     Additional phone number:   Household Members/Support Persons (HM/SP):   Household Member/Support Person 1, Household Member/Support Person 2   HM/SP Name Relationship DOB or Age  HM/SP -1 Arlana Canizales daughter 03-13-12  HM/SP -2 Minna Merritts 03-23-14  HM/SP -3     HM/SP -4     HM/SP -5     HM/SP -6     HM/SP -7     HM/SP -8       Natural Supports (not living in the home): Extended Family, Friends, Artist Supports:None   Employment:Unemployed   Type of Work:     Education:  9 to 11 years(11th Grade)   Homebound arranged: No  Financial Resources:Medicaid   Other Resources: Physicist, medical (Going to apply for [employer])   Cultural/Religious Considerations Which May Impact Care:   Strengths: Ability to meet basic needs , Home prepared for child , Pediatrician chosen   Psychotropic Medications:         Pediatrician:    [employer]  Pediatrician List:   [place] Other([facility] Department)  [facility]     Pediatrician Fax Number:    Risk Factors/Current Problems: Mental Health Concerns    Cognitive State: Able to Concentrate , Alert , Linear Thinking    Mood/Affect: Interested , Relaxed    CSW Assessment:CSW met with MOB at bedside to discuss consult for hx of  depression. MOB was bottle feeding baby. CSW introduced self and explained reason for consult. MOB denied any mental health history. CSW informed MOB about history depression that is documented in chart. MOB reported that she has not been diagnosed with depression and does not have it. CSW talked about mental health stigmas and normalized mental health. MOB reiterated that she does not have depression. CSW ended discussion about mental health.   CSW and MOB discussed MOB's household. MOB reported that she resides with her two older children and is currently unemployed. MOB reported that she has a good support system including her mother, FOB's mother, family and friends. MOB reported that she has everything that she needs for the baby.   MOB presented calm and was engaged with CSW throughout assessment. MOB denied any mental health history and did not demonstrate any acute mental health signs/symptoms. CSW assessed for safety, MOB denied SI, HI and domestic violence.   CSW provided education regarding the baby blues period vs. perinatal mood disorders, discussed treatment and gave resources for mental health follow up if concerns arise.  MOB denied any history of post partum depression with two older children. CSW recommends self-evaluation during the postpartum time period using the New Mom Checklist from Postpartum Progress and encouraged MOB to contact a medical professional if symptoms are noted at any time.    CSW provided review of  Sudden Infant Death Syndrome (SIDS) precautions.    CSW identifies no further need for intervention and no barriers to discharge at this time.  CSW Plan/Description: No Further Intervention Required/No Barriers to Discharge, Sudden Infant Death Syndrome (SIDS) Education, Perinatal Mood and Anxiety Disorder (PMADs) Education    Burnis Medin, LCSW 02/14/2018, 1:44 PM

## 2018-02-15 MED ORDER — IBUPROFEN 600 MG PO TABS: 600.0000 mg | ORAL_TABLET | Freq: Four times a day (QID) | ORAL | 0 refills | Status: DC

## 2018-02-15 NOTE — Plan of Care (Signed)
Patient appropriate for discharge.

## 2018-02-15 NOTE — Discharge Summary (Signed)
Obstetrics Discharge Summary OB/GYN Faculty Practice   Patient Name: Veronica Brooks DOB: [birth year] MRN: [id number]  Date of admission: 02/13/2018 Delivering MD: Gwenevere Abbot   Date of discharge: 02/15/2018  Admitting diagnosis: 39 WKS CTX Intrauterine pregnancy: [redacted]w[redacted]d     Secondary diagnosis:   Principal Problem:   Pregnancy Active Problems:   Seizure disorder (HCC)   Depression   Severe recurrent major depression without psychotic features [facility])   Discharge diagnosis: Term Pregnancy Delivered                          Postpartum procedures: None  Complications: none  Outpatient Follow-Up: [ ]  BTL papers/pre-op appointment [ ]  neurology appointment to discuss Keppra (Dr. Letta Pate in [facility] course: Veronica Brooks is a 22 y.o. [redacted]w[redacted]d who was admitted for IOL for NRNST, early labor. Her pregnancy was complicated by above noted. She states she was taken off her Keppra medication by her neurologist because she has not had a seizure in over a year. Her labor course was notable for epidural placement, penicillin for GBS colonization. Delivery was complicated by loose body/nuchal cord. Please see delivery/op note for additional details. Her postpartum course was uncomplicated. She was breastfeeding without difficulty. By day of discharge, she was passing flatus, urinating, eating and drinking without difficulty. Her pain was well-controlled, and she was discharged home with ibuprofen. She will follow-up in clinic in 4-6 weeks for postpartum visit and in 1-2 weeks for BTL pre-op appointment.   Physical exam  Vitals:   02/14/18 0433 02/14/18 1342 02/14/18 2155 02/15/18 0607  BP: 110/67 117/76 124/77 106/60  Pulse: 88 68 82 68  Resp: 16 18 18 16   Temp: 98.1 F (36.7 C) 98 F (36.7 C) 98.6 F (37 C) 98.3 F (36.8 C)  TempSrc: Oral  Oral Oral  SpO2: 98%     Weight:      Height:       General: well-appearing, NAD Lochia: appropriate Uterine Fundus: firm Incision: N/A DVT  Evaluation: No significant calf/ankle edema. Labs: Lab Results  Component Value Date   WBC 15.6 (H) 02/13/2018   HGB 9.4 (L) 02/13/2018   HCT 30.3 (L) 02/13/2018   MCV 90.2 02/13/2018   PLT 264 02/13/2018   CMP Latest Ref Rng & Units 10/20/2017  Glucose 70 - 99 mg/dL 956(O)  BUN 6 - 20 mg/dL 9  Creatinine 1.30 - 8.65 mg/dL 7.84  Sodium 696 - 295 mmol/L 136  Potassium 3.5 - 5.1 mmol/L 3.6  Chloride 98 - 111 mmol/L 106  CO2 22 - 32 mmol/L 22  Calcium 8.9 - 10.3 mg/dL 2.8(U)  Total Protein 6.5 - 8.1 g/dL 7.4  Total Bilirubin 0.3 - 1.2 mg/dL 1.3(K)  Alkaline Phos 38 - 126 U/L 50  AST 15 - 41 U/L 13(L)  ALT 0 - 44 U/L 9    Discharge instructions: Per After Visit Summary and "Baby and Me Booklet"  After visit meds:  Allergies as of 02/15/2018      Reactions   Latex Rash, Other (See Comments)   Reaction:  Burning       Medication List    STOP taking these medications   cephALEXin 250 MG capsule Commonly known as:  KEFLEX   levETIRAcetam 500 MG tablet Commonly known as:  KEPPRA   metoCLOPramide 10 MG tablet Commonly known as:  REGLAN     TAKE these medications   acetaminophen 325 MG tablet Commonly known as:  TYLENOL Take  650 mg by mouth every 6 (six) hours as needed for mild pain or headache.   albuterol 108 (90 Base) MCG/ACT inhaler Commonly known as:  PROVENTIL HFA;VENTOLIN HFA Inhale 2 puffs into the lungs every 4 (four) hours as needed for wheezing or shortness of breath.   famotidine 40 MG tablet Commonly known as:  PEPCID Take 40 mg by mouth daily as needed for heartburn or indigestion.   ibuprofen 600 MG tablet Commonly known as:  ADVIL,MOTRIN Take 1 tablet (600 mg total) by mouth every 6 (six) hours.   prenatal multivitamin Tabs tablet Take 1 tablet by mouth daily at 12 noon.      Postpartum contraception: interval BTL Diet: Routine Diet Activity: Advance as tolerated. Pelvic rest for 6 weeks.   Follow-up Appt:message sent to clinic to  schedule  Newborn Data: Live born female  Birth Weight: 6 lb 6.3 oz (2900 g) APGAR: 8, 9  Newborn Delivery   Birth date/time:  [birth year]/2018 13:54:00 Delivery type:  Vaginal, Spontaneous    Baby Feeding: Breast Disposition:home with mother  Cristal DeerLaurel S. Earlene PlaterWallace, DO OB/GYN Fellow, Faculty Practice

## 2018-03-02 NOTE — L&D Delivery Note (Signed)
Delivery Note At 4:54 PM a viable and healthy female was delivered via Vaginal, Spontaneous (Presentation: ROA ).  APGAR: 8, 9; weight 6 lb 14.1 oz (3120 g).   Placenta status: spontaneous, intact.  Cord:2 vessel with the following complications: none.    Anesthesia:  IV pain medications Episiotomy: None Lacerations: Labial Suture Repair: n/a Est. Blood Loss (mL): 247  Mom to postpartum.  Baby to Couplet care / Skin to Skin.  Veronica Brooks is a 23 y.o. female [phone] with IUP at [redacted]w[redacted]d admitted for active labor at term.  She progressed with AROM at 9 cm only as augmentation to complete and pushed less than 5 minutes to deliver.  Cord clamping delayed by 1-3 minutes then clamped by CNM and cut by FOB.  Placenta intact and spontaneous, bleeding minimal.  Small abrasions only bilateral labia, hemostatic with no repair.  Mom and baby stable prior to transfer to postpartum. She plans on breastfeeding. She requests BTL for birth control.   Fatima Blank 01/01/2019, 6:38 PM

## 2018-03-09 ENCOUNTER — Encounter: Payer: Self-pay | Admitting: Obstetrics and Gynecology

## 2018-04-27 ENCOUNTER — Other Ambulatory Visit: Payer: Self-pay | Admitting: Family Medicine

## 2018-04-27 ENCOUNTER — Other Ambulatory Visit (HOSPITAL_COMMUNITY): Payer: Self-pay | Admitting: Family Medicine

## 2018-04-27 DIAGNOSIS — Z3481 Encounter for supervision of other normal pregnancy, first trimester: Secondary | ICD-10-CM

## 2018-05-03 ENCOUNTER — Ambulatory Visit
Admission: RE | Admit: 2018-05-03 | Discharge: 2018-05-03 | Disposition: A | Discharge status: Home / Self Care | Payer: Medicaid Other | Source: Ambulatory Visit | Attending: Family Medicine | Admitting: Family Medicine

## 2018-05-03 ENCOUNTER — Other Ambulatory Visit: Payer: Self-pay

## 2018-05-03 DIAGNOSIS — Z3481 Encounter for supervision of other normal pregnancy, first trimester: Secondary | ICD-10-CM | POA: Insufficient documentation

## 2018-06-09 ENCOUNTER — Other Ambulatory Visit: Payer: Self-pay | Admitting: Family Medicine

## 2018-06-09 DIAGNOSIS — Z3481 Encounter for supervision of other normal pregnancy, first trimester: Secondary | ICD-10-CM

## 2018-06-27 ENCOUNTER — Other Ambulatory Visit: Payer: Self-pay

## 2018-06-27 ENCOUNTER — Ambulatory Visit
Admission: RE | Admit: 2018-06-27 | Discharge: 2018-06-27 | Disposition: A | Discharge status: Home / Self Care | Payer: Medicaid Other | Source: Ambulatory Visit | Attending: Family Medicine | Admitting: Family Medicine

## 2018-06-27 ENCOUNTER — Other Ambulatory Visit: Payer: Self-pay | Admitting: Family Medicine

## 2018-06-27 DIAGNOSIS — Z3481 Encounter for supervision of other normal pregnancy, first trimester: Secondary | ICD-10-CM | POA: Insufficient documentation

## 2018-07-05 ENCOUNTER — Other Ambulatory Visit: Payer: Self-pay

## 2018-07-05 ENCOUNTER — Emergency Department
Admission: EM | Admit: 2018-07-05 | Discharge: 2018-07-05 | Disposition: A | Discharge status: Home / Self Care | Payer: Medicaid Other | Attending: Emergency Medicine | Admitting: Emergency Medicine

## 2018-07-05 ENCOUNTER — Encounter: Payer: Self-pay | Admitting: Emergency Medicine

## 2018-07-05 DIAGNOSIS — R51 Headache: Secondary | ICD-10-CM | POA: Diagnosis present

## 2018-07-05 DIAGNOSIS — Z79899 Other long term (current) drug therapy: Secondary | ICD-10-CM | POA: Diagnosis not present

## 2018-07-05 DIAGNOSIS — J019 Acute sinusitis, unspecified: Secondary | ICD-10-CM | POA: Diagnosis not present

## 2018-07-05 DIAGNOSIS — Z87891 Personal history of nicotine dependence: Secondary | ICD-10-CM | POA: Diagnosis not present

## 2018-07-05 MED ORDER — LORATADINE 10 MG PO TABS
10.0000 mg | ORAL_TABLET | Freq: Every day | ORAL | Status: DC
  Administered 2018-07-05: 10 mg via ORAL

## 2018-07-05 MED ORDER — LORATADINE 10 MG PO TABS: 10.0000 mg | ORAL_TABLET | Freq: Every day | ORAL | 0 refills | Status: DC

## 2018-07-05 MED ORDER — ACETAMINOPHEN 325 MG PO TABS
650.0000 mg | ORAL_TABLET | Freq: Once | ORAL | Status: AC
  Administered 2018-07-05: 650 mg via ORAL
  Filled 2018-07-05: qty 2

## 2018-07-05 NOTE — ED Provider Notes (Signed)
[facility] Emergency Department Provider Note    First MD Initiated Contact with Patient 07/05/18 [phone]     (approximate)  I have reviewed the triage vital signs and the nursing notes.   HISTORY  Chief Complaint Headache   HPI Veronica Brooks is a 23 y.o. female approximately [redacted] weeks pregnant presents to the emergency department secondary to 1 month history of intermittent headaches and clear watery nasal drainage".  Patient states current pain score is 3 out of 10.  Patient denies any fever.  Patient denies any previous history of sinusitis.  Patient denies any previous history of chronic headaches.  Patient denies any weakness numbness gait instability or visual changes.        Past Medical History:  Diagnosis Date  . Acetaminophen overdose, intentional self-harm, initial encounter (HCC) 03/08/2016  . Bronchitis   . Depression   . Heart murmur    Does not require cardiologist  . Medical history non-contributory     Patient Active Problem List   Diagnosis Date Noted  . Severe recurrent major depression without psychotic features (HCC) 03/10/2016  . Depression 03/08/2016  . Suicidal behavior 03/08/2016  . Seizure disorder (HCC) 12/19/2014  . Pregnancy 03/23/2014  . UTI (urinary tract infection) during pregnancy 03/21/2014    Past Surgical History:  Procedure Laterality Date  . NO PAST SURGERIES      Prior to Admission medications   Medication Sig Start Date End Date Taking? Authorizing Provider  acetaminophen (TYLENOL) 325 MG tablet Take 650 mg by mouth every 6 (six) hours as needed for mild pain or headache.    [provider]  albuterol (PROVENTIL HFA;VENTOLIN HFA) 108 (90 Base) MCG/ACT inhaler Inhale 2 puffs into the lungs every 4 (four) hours as needed for wheezing or shortness of breath. 01/28/18   Cuthriell, Delorise Royals, PA-C  famotidine (PEPCID) 40 MG tablet Take 40 mg by mouth daily as needed for heartburn or indigestion.     [provider]  ibuprofen (ADVIL,MOTRIN) 600 MG tablet Take 1 tablet (600 mg total) by mouth every 6 (six) hours. 02/15/18   Tamera Stands, DO  loratadine (CLARITIN) 10 MG tablet Take 1 tablet (10 mg total) by mouth daily for 30 days. 07/05/18 08/04/18  Darci Current, MD  Prenatal Vit-Fe Fumarate-FA (PRENATAL MULTIVITAMIN) TABS tablet Take 1 tablet by mouth daily at 12 noon.    [provider]    Allergies Latex  Family History  Problem Relation Age of Onset  . Diabetes Maternal Grandmother   . Cancer Maternal Grandfather     Social History Social History   Tobacco Use  . Smoking status: Former Smoker    Types: Cigarettes  . Smokeless tobacco: Never Used  Substance Use Topics  . Alcohol use: No  . Drug use: No    Review of Systems Constitutional: No fever/chills Eyes: No visual changes. ENT: No sore throat. Cardiovascular: Denies chest pain. Respiratory: Denies shortness of breath. Gastrointestinal: No abdominal pain.  No nausea, no vomiting.  No diarrhea.  No constipation. Genitourinary: Negative for dysuria. Musculoskeletal: Negative for neck pain.  Negative for back pain. Integumentary: Negative for rash. Neurological: Negative for headaches, negative for  focal weakness or numbness.  ____________________________________________   PHYSICAL EXAM:  VITAL SIGNS: ED Triage Vitals  Enc Vitals Group     BP 07/05/18 0017 139/69     Pulse Rate 07/05/18 0017 89     Resp 07/05/18 0017 18     Temp 07/05/18  0017 98.3 F (36.8 C)     Temp Source 07/05/18 0017 Oral     SpO2 07/05/18 0017 98 %     Weight 07/05/18 0010 54.4 kg (120 lb)     Height 07/05/18 0010 1.549 m (5\' 1" )     Head Circumference --      Peak Flow --      Pain Score 07/05/18 0010 8     Pain Loc --      Pain Edu? --      Excl. in GC? --     Constitutional: Alert and oriented. Well appearing and in no acute distress. Eyes: Conjunctivae are normal. PERRL. EOMI. Head:  Atraumatic. Mouth/Throat: Mucous membranes are moist.  Oropharynx non-erythematous. Neck: No stridor.   Cardiovascular: Normal rate, regular rhythm. Good peripheral circulation. Grossly normal heart sounds. Respiratory: Normal respiratory effort.  No retractions. No audible wheezing. Gastrointestinal: Soft and nontender. No distention.  Musculoskeletal: No lower extremity tenderness nor edema. No gross deformities of extremities. Neurologic:  Normal speech and language. No gross focal neurologic deficits are appreciated.  Skin:  Skin is warm, dry and intact. No rash noted. Psychiatric: Mood and affect are normal. Speech and behavior are normal.    Procedures   ____________________________________________   INITIAL IMPRESSION / MDM / ASSESSMENT AND PLAN / ED COURSE  As part of my medical decision making, I reviewed the following data within the electronic MEDICAL RECORD NUMBER   23 year old female presenting with above-stated history and physical exam concerning for allergic versus bacterial sinusitis with sinus headache.  Considered possibility of intracranial pathology however patient has no normal neurological findings on clinical exam.  Patient given Claritin and Tylenol in the emergency department and will be prescribed the same for home.      ____________________________________________  FINAL CLINICAL IMPRESSION(S) / ED DIAGNOSES  Final diagnoses:  Acute sinusitis, recurrence not specified, unspecified location     MEDICATIONS GIVEN DURING THIS VISIT:  Medications  loratadine (CLARITIN) tablet 10 mg (has no administration in time range)  acetaminophen (TYLENOL) tablet 650 mg (has no administration in time range)     ED Discharge Orders         Ordered    loratadine (CLARITIN) 10 MG tablet  Daily     07/05/18 0336           Note:  This document was prepared using Dragon voice recognition software and may include unintentional dictation errors.   Darci CurrentBrown,  Tusayan N, MD 07/05/18 [phone]

## 2018-07-05 NOTE — ED Triage Notes (Addendum)
Patient ambulatory to triage with steady gait, without difficulty or distress noted, mask in place; pt reports occipital HA x month with watery nasal drainage; denies hx of same; pt approx [redacted]wks pregnant with no pregnancy c/o; pt at [facility]

## 2018-07-14 LAB — OB RESULTS CONSOLE HIV ANTIBODY (ROUTINE TESTING): HIV: NONREACTIVE

## 2018-07-14 LAB — OB RESULTS CONSOLE RUBELLA ANTIBODY, IGM: Rubella: IMMUNE

## 2018-07-14 LAB — OB RESULTS CONSOLE HEPATITIS B SURFACE ANTIGEN: Hepatitis B Surface Ag: NEGATIVE

## 2018-07-14 LAB — OB RESULTS CONSOLE VARICELLA ZOSTER ANTIBODY, IGG: Varicella: IMMUNE

## 2018-09-12 ENCOUNTER — Other Ambulatory Visit: Payer: Self-pay

## 2018-09-12 DIAGNOSIS — Z79899 Other long term (current) drug therapy: Secondary | ICD-10-CM | POA: Insufficient documentation

## 2018-09-12 DIAGNOSIS — Z91040 Latex allergy status: Secondary | ICD-10-CM | POA: Insufficient documentation

## 2018-09-12 DIAGNOSIS — J069 Acute upper respiratory infection, unspecified: Secondary | ICD-10-CM | POA: Insufficient documentation

## 2018-09-12 DIAGNOSIS — Z87891 Personal history of nicotine dependence: Secondary | ICD-10-CM | POA: Diagnosis not present

## 2018-09-12 DIAGNOSIS — O99512 Diseases of the respiratory system complicating pregnancy, second trimester: Secondary | ICD-10-CM | POA: Insufficient documentation

## 2018-09-12 DIAGNOSIS — Z3A24 24 weeks gestation of pregnancy: Secondary | ICD-10-CM | POA: Insufficient documentation

## 2018-09-12 DIAGNOSIS — F329 Major depressive disorder, single episode, unspecified: Secondary | ICD-10-CM | POA: Insufficient documentation

## 2018-09-12 DIAGNOSIS — O99342 Other mental disorders complicating pregnancy, second trimester: Secondary | ICD-10-CM | POA: Insufficient documentation

## 2018-09-12 DIAGNOSIS — Z20828 Contact with and (suspected) exposure to other viral communicable diseases: Secondary | ICD-10-CM | POA: Insufficient documentation

## 2018-09-12 LAB — BASIC METABOLIC PANEL
Anion gap: 9 (ref 5–15)
BUN: 7 mg/dL (ref 6–20)
CO2: 24 mmol/L (ref 22–32)
Calcium: 8.6 mg/dL — ABNORMAL LOW (ref 8.9–10.3)
Chloride: 101 mmol/L (ref 98–111)
Creatinine, Ser: 0.47 mg/dL (ref 0.44–1.00)
GFR calc Af Amer: >60 mL/min (ref >60)
GFR calc non Af Amer: >60 mL/min (ref >60)
Glucose, Bld: 90 mg/dL (ref 70–99)
Potassium: 3.2 mmol/L — ABNORMAL LOW (ref 3.5–5.1)
Sodium: 134 mmol/L — ABNORMAL LOW (ref 135–145)

## 2018-09-12 LAB — CBC
HCT: 32.5 % — ABNORMAL LOW (ref 36.0–46.0)
Hemoglobin: 10.5 g/dL — ABNORMAL LOW (ref 12.0–15.0)
MCH: 31.1 pg (ref 26.0–34.0)
MCHC: 32.3 g/dL (ref 30.0–36.0)
MCV: 96.2 fL (ref 80.0–100.0)
Platelets: 234 10*3/uL (ref 150–400)
RBC: 3.38 MIL/uL — ABNORMAL LOW (ref 3.87–5.11)
RDW: 13.9 % (ref 11.5–15.5)
WBC: 14.4 10*3/uL — ABNORMAL HIGH (ref 4.0–10.5)
nRBC: 0 % (ref 0.0–0.2)

## 2018-09-12 LAB — HCG, QUANTITATIVE, PREGNANCY: hCG, Beta Chain, Quant, S: 21969 m[IU]/mL — ABNORMAL HIGH (ref <5)

## 2018-09-12 LAB — SARS CORONAVIRUS 2 BY RT PCR (HOSPITAL ORDER, PERFORMED IN ~~LOC~~ HOSPITAL LAB): SARS Coronavirus 2: NEGATIVE

## 2018-09-12 NOTE — ED Triage Notes (Signed)
Pt to the er for sore throat, congestion, runny nose, cough, fatigue. Decreased appetite, pt is currently [redacted] weeks pregnant. Pt states she has not felt the baby move since Friday.

## 2018-09-13 ENCOUNTER — Emergency Department
Admission: EM | Admit: 2018-09-13 | Discharge: 2018-09-13 | Disposition: A | Discharge status: Home / Self Care | Payer: Medicaid Other | Attending: Student in an Organized Health Care Education/Training Program | Admitting: Student in an Organized Health Care Education/Training Program

## 2018-09-13 ENCOUNTER — Emergency Department: Payer: Medicaid Other

## 2018-09-13 DIAGNOSIS — R05 Cough: Secondary | ICD-10-CM

## 2018-09-13 DIAGNOSIS — J069 Acute upper respiratory infection, unspecified: Secondary | ICD-10-CM

## 2018-09-13 DIAGNOSIS — R059 Cough, unspecified: Secondary | ICD-10-CM

## 2018-09-13 MED ORDER — AZITHROMYCIN 500 MG PO TABS
500.0000 mg | ORAL_TABLET | Freq: Once | ORAL | Status: AC
  Administered 2018-09-13: 500 mg via ORAL
  Filled 2018-09-13: qty 1

## 2018-09-13 MED ORDER — SODIUM CHLORIDE 0.9 % IV BOLUS: 1000.0000 mL | Freq: Once | INTRAVENOUS | Status: DC

## 2018-09-13 MED ORDER — CEPHALEXIN 500 MG PO CAPS: 500.0000 mg | ORAL_CAPSULE | Freq: Three times a day (TID) | ORAL | 0 refills | Status: AC

## 2018-09-13 MED ORDER — ALBUTEROL SULFATE HFA 108 (90 BASE) MCG/ACT IN AERS: 2.0000 | INHALATION_SPRAY | RESPIRATORY_TRACT | 1 refills | Status: DC | PRN

## 2018-09-13 MED ORDER — AZITHROMYCIN 500 MG PO TABS: 500.0000 mg | ORAL_TABLET | Freq: Every day | ORAL | 0 refills | Status: AC

## 2018-09-13 MED ORDER — CEPHALEXIN 500 MG PO CAPS
500.0000 mg | ORAL_CAPSULE | Freq: Once | ORAL | Status: AC
  Administered 2018-09-13: 02:00:00 500 mg via ORAL
  Filled 2018-09-13: qty 1

## 2018-09-13 NOTE — ED Notes (Signed)
E-signature not working at this time. Pt verbalized understanding of D/C instructions. No further questions at this time. Pt ambulatory to lobby in NAD at this time.

## 2018-09-13 NOTE — ED Provider Notes (Signed)
[facility] Emergency Department Provider Note    First MD Initiated Contact with Patient 09/13/18 0139     (approximate)  I have reviewed the triage vital signs and the nursing notes.   HISTORY  Chief Complaint Shortness of Breath    HPI Veronica Brooks is a 23 y.o. female [phone] [id number] roughly [redacted] weeks pregnant presents the ER for nasal congestion sore throat productive cough with yellow sputum decreased appetite starting over the weekend.  Has not had any measured temperatures.  Does have a history of bronchitis was never been diagnosed with asthma.  She did smoke previous to pregnancy.  Does not currently smoke.  Denies any leg swelling.  No chest pain.  No vomiting.  No abdominal pain or flank pain.     Past Medical History:  Diagnosis Date  . Acetaminophen overdose, intentional self-harm, initial encounter (HCC) 03/08/2016  . Bronchitis   . Depression   . Heart murmur    Does not require cardiologist  . Medical history non-contributory    Family History  Problem Relation Age of Onset  . Diabetes Maternal Grandmother   . Cancer Maternal Grandfather    Past Surgical History:  Procedure Laterality Date  . NO PAST SURGERIES     Patient Active Problem List   Diagnosis Date Noted  . Severe recurrent major depression without psychotic features (HCC) 03/10/2016  . Depression 03/08/2016  . Suicidal behavior 03/08/2016  . Seizure disorder (HCC) 12/19/2014  . Pregnancy 03/23/2014  . UTI (urinary tract infection) during pregnancy 03/21/2014      Prior to Admission medications   Medication Sig Start Date End Date Taking? Authorizing Provider  acetaminophen (TYLENOL) 325 MG tablet Take 650 mg by mouth every 6 (six) hours as needed for mild pain or headache.    [provider]  albuterol (PROVENTIL HFA;VENTOLIN HFA) 108 (90 Base) MCG/ACT inhaler Inhale 2 puffs into the lungs every 4 (four) hours as needed for wheezing or shortness of breath. 01/28/18    Cuthriell, Delorise RoyalsJonathan D, PA-C  famotidine (PEPCID) 40 MG tablet Take 40 mg by mouth daily as needed for heartburn or indigestion.    [provider]  ibuprofen (ADVIL,MOTRIN) 600 MG tablet Take 1 tablet (600 mg total) by mouth every 6 (six) hours. 02/15/18   Tamera StandsWallace, Laurel S, DO  loratadine (CLARITIN) 10 MG tablet Take 1 tablet (10 mg total) by mouth daily for 30 days. 07/05/18 08/04/18  Darci CurrentBrown, [place] N, MD  Prenatal Vit-Fe Fumarate-FA (PRENATAL MULTIVITAMIN) TABS tablet Take 1 tablet by mouth daily at 12 noon.    [provider]    Allergies Latex    Social History Social History   Tobacco Use  . Smoking status: Former Smoker    Types: Cigarettes  . Smokeless tobacco: Never Used  Substance Use Topics  . Alcohol use: No  . Drug use: No    Review of Systems Patient denies headaches, rhinorrhea, blurry vision, numbness, shortness of breath, chest pain, edema, cough, abdominal pain, nausea, vomiting, diarrhea, dysuria, fevers, rashes or hallucinations unless otherwise stated above in HPI. ____________________________________________   PHYSICAL EXAM:  VITAL SIGNS: Vitals:   09/12/18 2029  BP: (!) 127/42  Pulse: (!) 111  Resp: 18  Temp: 98.4 F (36.9 C)  SpO2: 100%    Constitutional: Alert and oriented.  Eyes: Conjunctivae are normal.  Head: Atraumatic. Nose: No congestion/rhinnorhea. Mouth/Throat: Mucous membranes are moist.   Neck: No stridor. Painless ROM.  Cardiovascular: Normal rate, regular rhythm. Grossly normal  heart sounds.  Good peripheral circulation. Respiratory: Normal respiratory effort.  No retractions. Lungs with faint scattered wheeze with good airmovement throughout Gastrointestinal: Soft and nontender. No distention. No abdominal bruits. No CVA tenderness. Genitourinary:  Musculoskeletal: No lower extremity tenderness nor edema.  No joint effusions. Neurologic:  Normal speech and language. No gross focal neurologic deficits are  appreciated. No facial droop Skin:  Skin is warm, dry and intact. No rash noted. Psychiatric: Mood and affect are normal. Speech and behavior are normal.  ____________________________________________   LABS (all labs ordered are listed, but only abnormal results are displayed)  Results for orders placed or performed during the hospital encounter of 09/13/18 (from the past 24 hour(s))  CBC     Status: Abnormal   Collection Time: 09/12/18  8:43 PM  Result Value Ref Range   WBC 14.4 (H) 4.0 - 10.5 K/uL   RBC 3.38 (L) 3.87 - 5.11 MIL/uL   Hemoglobin 10.5 (L) 12.0 - 15.0 g/dL   HCT 11.932.5 (L) 14.736.0 - 82.946.0 %   MCV 96.2 80.0 - 100.0 fL   MCH 31.1 26.0 - 34.0 pg   MCHC 32.3 30.0 - 36.0 g/dL   RDW 56.213.9 13.011.5 - 86.515.5 %   Platelets 234 150 - 400 K/uL   nRBC 0.0 0.0 - 0.2 %  Basic metabolic panel     Status: Abnormal   Collection Time: 09/12/18  8:43 PM  Result Value Ref Range   Sodium 134 (L) 135 - 145 mmol/L   Potassium 3.2 (L) 3.5 - 5.1 mmol/L   Chloride 101 98 - 111 mmol/L   CO2 24 22 - 32 mmol/L   Glucose, Bld 90 70 - 99 mg/dL   BUN 7 6 - 20 mg/dL   Creatinine, Ser 7.840.47 0.44 - 1.00 mg/dL   Calcium 8.6 (L) 8.9 - 10.3 mg/dL   GFR calc non Af Amer >60 >60 mL/min   GFR calc Af Amer >60 >60 mL/min   Anion gap 9 5 - 15  SARS Coronavirus 2 (CEPHEID- [facility]), Hosp Order     Status: None   Collection Time: 09/12/18  8:43 PM   Specimen: Nasopharyngeal Swab  Result Value Ref Range   SARS Coronavirus 2 NEGATIVE NEGATIVE  hCG, quantitative, pregnancy     Status: Abnormal   Collection Time: 09/12/18  8:43 PM  Result Value Ref Range   hCG, Beta Chain, Quant, S 21,969 (H) <5 mIU/mL   ____________________________________________ ________________________________  RADIOLOGY  I personally reviewed all radiographic images ordered to evaluate for the above acute complaints and reviewed radiology reports and findings.  These findings were personally discussed with the  patient.  Please see medical record for radiology report.  ____________________________________________   PROCEDURES  Procedure(s) performed:  Procedures    Critical Care performed: no ____________________________________________   INITIAL IMPRESSION / ASSESSMENT AND PLAN / ED COURSE  Pertinent labs & imaging results that were available during my care of the patient were reviewed by me and considered in my medical decision making (see chart for details).   DDX: Pneumonia, bronchitis, asthma, covid 19, flu, sinusitis, strep  Veronica Brooks is a 23 y.o. who presents to the ED with symptoms as described above.  Symptoms consistent with upper respiratory infection.  No hypoxia.  Mild tachycardia.  Not consistent with PE or intra-abdominal process.  Bedside ultrasound shows reassuring fetal heart tones and fetal movement.  Her abdominal exam is soft and benign.  Does have streaky opacity likely  corresponding with component of bronchitis versus possible community-acquired pneumonia.  Given her symptoms we will treat with Keflex and azithromycin.  Patient has had improvement with albuterol treatments in the past therefore will continue with that.  Not consistent with asthma exacerbation.  Have discussed with the patient and available family all diagnostics and treatments performed thus far and all questions were answered to the best of my ability. The patient demonstrates understanding and agreement with plan.      The patient was evaluated in Emergency Department today for the symptoms described in the history of present illness. He/she was evaluated in the context of the global COVID-19 pandemic, which necessitated consideration that the patient might be at risk for infection with the SARS-CoV-2 virus that causes COVID-19. Institutional protocols and algorithms that pertain to the evaluation of patients at risk for COVID-19 are in a state of rapid change based on information released by regulatory  bodies including the CDC and federal and state organizations. These policies and algorithms were followed during the patient's care in the ED.  As part of my medical decision making, I reviewed the following data within the [place] notes reviewed and incorporated, Labs reviewed, notes from prior ED visits and [place] Controlled Substance Database   ____________________________________________   FINAL CLINICAL IMPRESSION(S) / ED DIAGNOSES  Final diagnoses:  Cough  Upper respiratory tract infection, unspecified type      NEW MEDICATIONS STARTED DURING THIS VISIT:  New Prescriptions   No medications on file     Note:  This document was prepared using Dragon voice recognition software and may include unintentional dictation errors.    Merlyn Lot, MD 09/13/18 [phone]

## 2018-09-13 NOTE — ED Notes (Signed)
Pt given water at this time 

## 2018-09-29 ENCOUNTER — Other Ambulatory Visit: Payer: Self-pay | Admitting: Family Medicine

## 2018-09-29 ENCOUNTER — Other Ambulatory Visit (HOSPITAL_COMMUNITY): Payer: Self-pay | Admitting: Family Medicine

## 2018-10-04 ENCOUNTER — Other Ambulatory Visit (HOSPITAL_COMMUNITY): Payer: Self-pay | Admitting: Obstetrics and Gynecology

## 2018-10-04 ENCOUNTER — Other Ambulatory Visit: Payer: Self-pay

## 2018-10-04 ENCOUNTER — Ambulatory Visit (HOSPITAL_COMMUNITY)
Admission: RE | Admit: 2018-10-04 | Discharge: 2018-10-04 | Disposition: A | Discharge status: Home / Self Care | Payer: Medicaid Other | Source: Ambulatory Visit | Attending: Obstetrics and Gynecology | Admitting: Obstetrics and Gynecology

## 2018-10-04 DIAGNOSIS — O09892 Supervision of other high risk pregnancies, second trimester: Secondary | ICD-10-CM

## 2018-10-04 DIAGNOSIS — O09292 Supervision of pregnancy with other poor reproductive or obstetric history, second trimester: Secondary | ICD-10-CM

## 2018-10-04 DIAGNOSIS — Z3A27 27 weeks gestation of pregnancy: Secondary | ICD-10-CM

## 2018-10-04 DIAGNOSIS — O99352 Diseases of the nervous system complicating pregnancy, second trimester: Secondary | ICD-10-CM | POA: Diagnosis present

## 2018-10-04 DIAGNOSIS — Z363 Encounter for antenatal screening for malformations: Secondary | ICD-10-CM | POA: Insufficient documentation

## 2018-10-04 DIAGNOSIS — G40909 Epilepsy, unspecified, not intractable, without status epilepticus: Secondary | ICD-10-CM

## 2018-10-04 DIAGNOSIS — O99012 Anemia complicating pregnancy, second trimester: Secondary | ICD-10-CM | POA: Diagnosis not present

## 2018-10-05 ENCOUNTER — Other Ambulatory Visit (HOSPITAL_COMMUNITY): Payer: Self-pay | Admitting: *Deleted

## 2018-10-05 DIAGNOSIS — O365990 Maternal care for other known or suspected poor fetal growth, unspecified trimester, not applicable or unspecified: Secondary | ICD-10-CM

## 2018-10-05 DIAGNOSIS — Z362 Encounter for other antenatal screening follow-up: Secondary | ICD-10-CM

## 2018-10-07 ENCOUNTER — Encounter (HOSPITAL_COMMUNITY): Payer: Self-pay | Admitting: *Deleted

## 2018-10-12 ENCOUNTER — Ambulatory Visit (HOSPITAL_COMMUNITY)
Admission: RE | Admit: 2018-10-12 | Discharge: 2018-10-12 | Disposition: A | Discharge status: Home / Self Care | Payer: Medicaid Other | Source: Ambulatory Visit | Attending: Obstetrics and Gynecology | Admitting: Obstetrics and Gynecology

## 2018-10-12 ENCOUNTER — Ambulatory Visit (HOSPITAL_COMMUNITY): Payer: Medicaid Other | Admitting: *Deleted

## 2018-10-12 ENCOUNTER — Other Ambulatory Visit: Payer: Self-pay

## 2018-10-12 ENCOUNTER — Encounter (HOSPITAL_COMMUNITY): Payer: Self-pay | Admitting: *Deleted

## 2018-10-12 VITALS — BP 110/59 | HR 97 | Temp 98.6°F

## 2018-10-12 DIAGNOSIS — O365990 Maternal care for other known or suspected poor fetal growth, unspecified trimester, not applicable or unspecified: Secondary | ICD-10-CM | POA: Diagnosis present

## 2018-10-12 DIAGNOSIS — O365930 Maternal care for other known or suspected poor fetal growth, third trimester, not applicable or unspecified: Secondary | ICD-10-CM | POA: Insufficient documentation

## 2018-10-12 DIAGNOSIS — O09893 Supervision of other high risk pregnancies, third trimester: Secondary | ICD-10-CM | POA: Diagnosis not present

## 2018-10-12 DIAGNOSIS — O99353 Diseases of the nervous system complicating pregnancy, third trimester: Secondary | ICD-10-CM

## 2018-10-12 DIAGNOSIS — G40909 Epilepsy, unspecified, not intractable, without status epilepticus: Secondary | ICD-10-CM | POA: Diagnosis not present

## 2018-10-12 DIAGNOSIS — O99013 Anemia complicating pregnancy, third trimester: Secondary | ICD-10-CM | POA: Diagnosis not present

## 2018-10-12 DIAGNOSIS — Z362 Encounter for other antenatal screening follow-up: Secondary | ICD-10-CM | POA: Insufficient documentation

## 2018-10-12 DIAGNOSIS — O09293 Supervision of pregnancy with other poor reproductive or obstetric history, third trimester: Secondary | ICD-10-CM

## 2018-10-12 DIAGNOSIS — Z3A29 29 weeks gestation of pregnancy: Secondary | ICD-10-CM

## 2018-10-13 LAB — OB RESULTS CONSOLE RPR: RPR: NONREACTIVE

## 2018-10-19 ENCOUNTER — Other Ambulatory Visit: Payer: Self-pay

## 2018-10-19 ENCOUNTER — Ambulatory Visit (HOSPITAL_COMMUNITY)
Admission: RE | Admit: 2018-10-19 | Discharge: 2018-10-19 | Disposition: A | Discharge status: Home / Self Care | Payer: Medicaid Other | Source: Ambulatory Visit | Attending: Obstetrics and Gynecology | Admitting: Obstetrics and Gynecology

## 2018-10-19 ENCOUNTER — Ambulatory Visit (HOSPITAL_COMMUNITY): Payer: Medicaid Other | Admitting: *Deleted

## 2018-10-19 ENCOUNTER — Encounter (HOSPITAL_COMMUNITY): Payer: Self-pay

## 2018-10-19 VITALS — BP 118/58 | HR 92 | Temp 98.5°F

## 2018-10-19 DIAGNOSIS — O365990 Maternal care for other known or suspected poor fetal growth, unspecified trimester, not applicable or unspecified: Secondary | ICD-10-CM | POA: Insufficient documentation

## 2018-10-19 DIAGNOSIS — O09293 Supervision of pregnancy with other poor reproductive or obstetric history, third trimester: Secondary | ICD-10-CM

## 2018-10-19 DIAGNOSIS — Z3A30 30 weeks gestation of pregnancy: Secondary | ICD-10-CM

## 2018-10-19 DIAGNOSIS — O99013 Anemia complicating pregnancy, third trimester: Secondary | ICD-10-CM

## 2018-10-19 DIAGNOSIS — O99353 Diseases of the nervous system complicating pregnancy, third trimester: Secondary | ICD-10-CM

## 2018-10-19 DIAGNOSIS — G40909 Epilepsy, unspecified, not intractable, without status epilepticus: Secondary | ICD-10-CM

## 2018-10-19 DIAGNOSIS — O09893 Supervision of other high risk pregnancies, third trimester: Secondary | ICD-10-CM

## 2018-10-19 DIAGNOSIS — Z362 Encounter for other antenatal screening follow-up: Secondary | ICD-10-CM | POA: Diagnosis not present

## 2018-10-20 ENCOUNTER — Other Ambulatory Visit (HOSPITAL_COMMUNITY): Payer: Self-pay | Admitting: *Deleted

## 2018-10-20 DIAGNOSIS — O365930 Maternal care for other known or suspected poor fetal growth, third trimester, not applicable or unspecified: Secondary | ICD-10-CM

## 2018-10-26 ENCOUNTER — Other Ambulatory Visit (HOSPITAL_COMMUNITY): Payer: No Typology Code available for payment source

## 2018-10-26 ENCOUNTER — Encounter (HOSPITAL_COMMUNITY): Payer: Self-pay

## 2018-10-26 ENCOUNTER — Ambulatory Visit (HOSPITAL_COMMUNITY)
Admission: RE | Admit: 2018-10-26 | Discharge: 2018-10-26 | Disposition: A | Discharge status: Home / Self Care | Payer: Medicaid Other | Source: Ambulatory Visit | Attending: Obstetrics and Gynecology | Admitting: Obstetrics and Gynecology

## 2018-10-26 ENCOUNTER — Other Ambulatory Visit: Payer: Self-pay

## 2018-10-26 ENCOUNTER — Ambulatory Visit (HOSPITAL_COMMUNITY): Payer: Medicaid Other | Admitting: *Deleted

## 2018-10-26 VITALS — BP 116/58 | HR 96 | Temp 98.6°F

## 2018-10-26 DIAGNOSIS — O99013 Anemia complicating pregnancy, third trimester: Secondary | ICD-10-CM

## 2018-10-26 DIAGNOSIS — G40909 Epilepsy, unspecified, not intractable, without status epilepticus: Secondary | ICD-10-CM | POA: Diagnosis not present

## 2018-10-26 DIAGNOSIS — O09293 Supervision of pregnancy with other poor reproductive or obstetric history, third trimester: Secondary | ICD-10-CM

## 2018-10-26 DIAGNOSIS — O99353 Diseases of the nervous system complicating pregnancy, third trimester: Secondary | ICD-10-CM | POA: Diagnosis not present

## 2018-10-26 DIAGNOSIS — O09893 Supervision of other high risk pregnancies, third trimester: Secondary | ICD-10-CM | POA: Diagnosis not present

## 2018-10-26 DIAGNOSIS — Z362 Encounter for other antenatal screening follow-up: Secondary | ICD-10-CM | POA: Diagnosis not present

## 2018-10-26 DIAGNOSIS — O365990 Maternal care for other known or suspected poor fetal growth, unspecified trimester, not applicable or unspecified: Secondary | ICD-10-CM

## 2018-10-26 DIAGNOSIS — O365930 Maternal care for other known or suspected poor fetal growth, third trimester, not applicable or unspecified: Secondary | ICD-10-CM

## 2018-10-26 DIAGNOSIS — Z3A31 31 weeks gestation of pregnancy: Secondary | ICD-10-CM

## 2018-11-02 ENCOUNTER — Ambulatory Visit (HOSPITAL_COMMUNITY): Payer: No Typology Code available for payment source

## 2018-11-02 ENCOUNTER — Encounter (HOSPITAL_COMMUNITY): Payer: Self-pay

## 2018-11-02 ENCOUNTER — Ambulatory Visit (HOSPITAL_COMMUNITY): Payer: Medicaid Other | Admitting: *Deleted

## 2018-11-02 ENCOUNTER — Ambulatory Visit (HOSPITAL_COMMUNITY)
Admission: RE | Admit: 2018-11-02 | Discharge: 2018-11-02 | Disposition: A | Discharge status: Home / Self Care | Payer: Medicaid Other | Source: Ambulatory Visit | Attending: Obstetrics and Gynecology | Admitting: Obstetrics and Gynecology

## 2018-11-02 ENCOUNTER — Other Ambulatory Visit (HOSPITAL_COMMUNITY): Payer: Self-pay | Admitting: *Deleted

## 2018-11-02 ENCOUNTER — Other Ambulatory Visit: Payer: Self-pay

## 2018-11-02 VITALS — BP 113/61 | HR 92 | Temp 98.6°F

## 2018-11-02 DIAGNOSIS — O99013 Anemia complicating pregnancy, third trimester: Secondary | ICD-10-CM

## 2018-11-02 DIAGNOSIS — O365990 Maternal care for other known or suspected poor fetal growth, unspecified trimester, not applicable or unspecified: Secondary | ICD-10-CM

## 2018-11-02 DIAGNOSIS — O09893 Supervision of other high risk pregnancies, third trimester: Secondary | ICD-10-CM | POA: Diagnosis not present

## 2018-11-02 DIAGNOSIS — G40909 Epilepsy, unspecified, not intractable, without status epilepticus: Secondary | ICD-10-CM

## 2018-11-02 DIAGNOSIS — O99353 Diseases of the nervous system complicating pregnancy, third trimester: Secondary | ICD-10-CM

## 2018-11-02 DIAGNOSIS — Z3A32 32 weeks gestation of pregnancy: Secondary | ICD-10-CM

## 2018-11-02 DIAGNOSIS — O365930 Maternal care for other known or suspected poor fetal growth, third trimester, not applicable or unspecified: Secondary | ICD-10-CM | POA: Diagnosis present

## 2018-11-02 DIAGNOSIS — O09293 Supervision of pregnancy with other poor reproductive or obstetric history, third trimester: Secondary | ICD-10-CM

## 2018-11-10 ENCOUNTER — Other Ambulatory Visit: Payer: Self-pay

## 2018-11-10 ENCOUNTER — Ambulatory Visit (HOSPITAL_COMMUNITY)
Admission: RE | Admit: 2018-11-10 | Discharge: 2018-11-10 | Disposition: A | Discharge status: Home / Self Care | Payer: Medicaid Other | Source: Ambulatory Visit | Attending: Obstetrics and Gynecology | Admitting: Obstetrics and Gynecology

## 2018-11-10 ENCOUNTER — Encounter (HOSPITAL_COMMUNITY): Payer: Self-pay

## 2018-11-10 ENCOUNTER — Ambulatory Visit (HOSPITAL_COMMUNITY): Payer: Medicaid Other | Admitting: *Deleted

## 2018-11-10 VITALS — BP 121/58 | HR 90 | Temp 98.5°F

## 2018-11-10 DIAGNOSIS — G40909 Epilepsy, unspecified, not intractable, without status epilepticus: Secondary | ICD-10-CM | POA: Diagnosis not present

## 2018-11-10 DIAGNOSIS — O09893 Supervision of other high risk pregnancies, third trimester: Secondary | ICD-10-CM

## 2018-11-10 DIAGNOSIS — O99353 Diseases of the nervous system complicating pregnancy, third trimester: Secondary | ICD-10-CM | POA: Diagnosis not present

## 2018-11-10 DIAGNOSIS — O365990 Maternal care for other known or suspected poor fetal growth, unspecified trimester, not applicable or unspecified: Secondary | ICD-10-CM | POA: Diagnosis present

## 2018-11-10 DIAGNOSIS — O99013 Anemia complicating pregnancy, third trimester: Secondary | ICD-10-CM | POA: Diagnosis not present

## 2018-11-10 DIAGNOSIS — Z3A33 33 weeks gestation of pregnancy: Secondary | ICD-10-CM

## 2018-11-10 DIAGNOSIS — O365930 Maternal care for other known or suspected poor fetal growth, third trimester, not applicable or unspecified: Secondary | ICD-10-CM

## 2018-11-10 DIAGNOSIS — O09293 Supervision of pregnancy with other poor reproductive or obstetric history, third trimester: Secondary | ICD-10-CM

## 2018-11-17 ENCOUNTER — Encounter (HOSPITAL_COMMUNITY): Payer: Self-pay

## 2018-11-17 ENCOUNTER — Ambulatory Visit (HOSPITAL_COMMUNITY): Payer: Medicaid Other | Admitting: *Deleted

## 2018-11-17 ENCOUNTER — Other Ambulatory Visit: Payer: Self-pay

## 2018-11-17 ENCOUNTER — Ambulatory Visit (HOSPITAL_COMMUNITY): Payer: Medicaid Other

## 2018-11-17 ENCOUNTER — Ambulatory Visit (HOSPITAL_COMMUNITY)
Admission: RE | Admit: 2018-11-17 | Discharge: 2018-11-17 | Disposition: A | Discharge status: Home / Self Care | Payer: Medicaid Other | Source: Ambulatory Visit | Attending: Obstetrics and Gynecology | Admitting: Obstetrics and Gynecology

## 2018-11-17 VITALS — BP 118/56 | HR 88 | Temp 98.5°F

## 2018-11-17 DIAGNOSIS — O99353 Diseases of the nervous system complicating pregnancy, third trimester: Secondary | ICD-10-CM | POA: Diagnosis not present

## 2018-11-17 DIAGNOSIS — O365990 Maternal care for other known or suspected poor fetal growth, unspecified trimester, not applicable or unspecified: Secondary | ICD-10-CM

## 2018-11-17 DIAGNOSIS — G40909 Epilepsy, unspecified, not intractable, without status epilepticus: Secondary | ICD-10-CM

## 2018-11-17 DIAGNOSIS — O365930 Maternal care for other known or suspected poor fetal growth, third trimester, not applicable or unspecified: Secondary | ICD-10-CM

## 2018-11-17 DIAGNOSIS — O09893 Supervision of other high risk pregnancies, third trimester: Secondary | ICD-10-CM

## 2018-11-17 DIAGNOSIS — Z3A34 34 weeks gestation of pregnancy: Secondary | ICD-10-CM

## 2018-11-17 DIAGNOSIS — O09293 Supervision of pregnancy with other poor reproductive or obstetric history, third trimester: Secondary | ICD-10-CM

## 2018-11-17 DIAGNOSIS — O99013 Anemia complicating pregnancy, third trimester: Secondary | ICD-10-CM | POA: Diagnosis not present

## 2018-11-24 ENCOUNTER — Ambulatory Visit (HOSPITAL_COMMUNITY)
Admission: RE | Admit: 2018-11-24 | Discharge: 2018-11-24 | Disposition: A | Discharge status: Home / Self Care | Payer: Medicaid Other | Source: Ambulatory Visit | Attending: Obstetrics and Gynecology | Admitting: Obstetrics and Gynecology

## 2018-11-24 ENCOUNTER — Encounter (HOSPITAL_COMMUNITY): Payer: Self-pay

## 2018-11-24 ENCOUNTER — Other Ambulatory Visit: Payer: Self-pay

## 2018-11-24 ENCOUNTER — Ambulatory Visit (HOSPITAL_COMMUNITY): Payer: Medicaid Other | Admitting: *Deleted

## 2018-11-24 VITALS — BP 120/61 | HR 90 | Temp 98.5°F

## 2018-11-24 DIAGNOSIS — O99353 Diseases of the nervous system complicating pregnancy, third trimester: Secondary | ICD-10-CM

## 2018-11-24 DIAGNOSIS — G40909 Epilepsy, unspecified, not intractable, without status epilepticus: Secondary | ICD-10-CM | POA: Diagnosis not present

## 2018-11-24 DIAGNOSIS — O365990 Maternal care for other known or suspected poor fetal growth, unspecified trimester, not applicable or unspecified: Secondary | ICD-10-CM

## 2018-11-24 DIAGNOSIS — O365930 Maternal care for other known or suspected poor fetal growth, third trimester, not applicable or unspecified: Secondary | ICD-10-CM | POA: Diagnosis not present

## 2018-11-24 DIAGNOSIS — O99013 Anemia complicating pregnancy, third trimester: Secondary | ICD-10-CM | POA: Diagnosis not present

## 2018-11-24 DIAGNOSIS — Z362 Encounter for other antenatal screening follow-up: Secondary | ICD-10-CM

## 2018-11-24 DIAGNOSIS — Z3A35 35 weeks gestation of pregnancy: Secondary | ICD-10-CM

## 2018-11-24 DIAGNOSIS — O09893 Supervision of other high risk pregnancies, third trimester: Secondary | ICD-10-CM | POA: Diagnosis not present

## 2018-11-24 DIAGNOSIS — O09293 Supervision of pregnancy with other poor reproductive or obstetric history, third trimester: Secondary | ICD-10-CM

## 2018-11-25 ENCOUNTER — Other Ambulatory Visit (HOSPITAL_COMMUNITY): Payer: Self-pay | Admitting: *Deleted

## 2018-11-25 DIAGNOSIS — Z8759 Personal history of other complications of pregnancy, childbirth and the puerperium: Secondary | ICD-10-CM

## 2018-12-08 LAB — OB RESULTS CONSOLE GBS: GBS: POSITIVE

## 2018-12-08 LAB — OB RESULTS CONSOLE GC/CHLAMYDIA
Chlamydia: NEGATIVE
Gonorrhea: NEGATIVE

## 2018-12-15 ENCOUNTER — Ambulatory Visit (HOSPITAL_COMMUNITY)
Admission: RE | Admit: 2018-12-15 | Discharge: 2018-12-15 | Disposition: A | Discharge status: Home / Self Care | Payer: Medicaid Other | Source: Ambulatory Visit | Attending: Maternal & Fetal Medicine | Admitting: Maternal & Fetal Medicine

## 2018-12-15 ENCOUNTER — Ambulatory Visit (HOSPITAL_COMMUNITY): Payer: Medicaid Other | Admitting: *Deleted

## 2018-12-15 ENCOUNTER — Encounter (HOSPITAL_COMMUNITY): Payer: Self-pay

## 2018-12-15 ENCOUNTER — Other Ambulatory Visit: Payer: Self-pay

## 2018-12-15 VITALS — BP 127/62 | HR 93 | Temp 98.0°F

## 2018-12-15 DIAGNOSIS — O09893 Supervision of other high risk pregnancies, third trimester: Secondary | ICD-10-CM

## 2018-12-15 DIAGNOSIS — O99353 Diseases of the nervous system complicating pregnancy, third trimester: Secondary | ICD-10-CM

## 2018-12-15 DIAGNOSIS — Z8632 Personal history of gestational diabetes: Secondary | ICD-10-CM

## 2018-12-15 DIAGNOSIS — O09293 Supervision of pregnancy with other poor reproductive or obstetric history, third trimester: Secondary | ICD-10-CM

## 2018-12-15 DIAGNOSIS — Z362 Encounter for other antenatal screening follow-up: Secondary | ICD-10-CM

## 2018-12-15 DIAGNOSIS — O365930 Maternal care for other known or suspected poor fetal growth, third trimester, not applicable or unspecified: Secondary | ICD-10-CM | POA: Diagnosis not present

## 2018-12-15 DIAGNOSIS — O99013 Anemia complicating pregnancy, third trimester: Secondary | ICD-10-CM | POA: Diagnosis not present

## 2018-12-15 DIAGNOSIS — G40909 Epilepsy, unspecified, not intractable, without status epilepticus: Secondary | ICD-10-CM

## 2018-12-15 DIAGNOSIS — Z8759 Personal history of other complications of pregnancy, childbirth and the puerperium: Secondary | ICD-10-CM | POA: Diagnosis not present

## 2018-12-15 DIAGNOSIS — Z3A38 38 weeks gestation of pregnancy: Secondary | ICD-10-CM

## 2018-12-23 ENCOUNTER — Encounter (HOSPITAL_COMMUNITY): Payer: Self-pay

## 2018-12-23 ENCOUNTER — Other Ambulatory Visit: Payer: Self-pay

## 2018-12-23 ENCOUNTER — Inpatient Hospital Stay (HOSPITAL_COMMUNITY)
Admission: AD | Admit: 2018-12-23 | Discharge: 2018-12-23 | Disposition: A | Discharge status: Home / Self Care | Payer: Medicaid Other | Attending: Obstetrics and Gynecology | Admitting: Obstetrics and Gynecology

## 2018-12-23 DIAGNOSIS — O471 False labor at or after 37 completed weeks of gestation: Secondary | ICD-10-CM | POA: Diagnosis present

## 2018-12-23 DIAGNOSIS — N898 Other specified noninflammatory disorders of vagina: Secondary | ICD-10-CM | POA: Diagnosis not present

## 2018-12-23 DIAGNOSIS — O26893 Other specified pregnancy related conditions, third trimester: Secondary | ICD-10-CM

## 2018-12-23 DIAGNOSIS — Z3A39 39 weeks gestation of pregnancy: Secondary | ICD-10-CM | POA: Diagnosis not present

## 2018-12-23 LAB — POCT FERN TEST: POCT Fern Test: NEGATIVE

## 2018-12-23 NOTE — Discharge Instructions (Signed)
Braxton Hicks Contractions Contractions of the uterus can occur throughout pregnancy, but they are not always a sign that you are in labor. You may have practice contractions called Braxton Hicks contractions. These false labor contractions are sometimes confused with true labor. What are Braxton Hicks contractions? Braxton Hicks contractions are tightening movements that occur in the muscles of the uterus before labor. Unlike true labor contractions, these contractions do not result in opening (dilation) and thinning of the cervix. Toward the end of pregnancy (32-34 weeks), Braxton Hicks contractions can happen more often and may become stronger. These contractions are sometimes difficult to tell apart from true labor because they can be very uncomfortable. You should not feel embarrassed if you go to the hospital with false labor. Sometimes, the only way to tell if you are in true labor is for your health care provider to look for changes in the cervix. The health care provider will do a physical exam and may monitor your contractions. If you are not in true labor, the exam should show that your cervix is not dilating and your water has not broken. If there are no other health problems associated with your pregnancy, it is completely safe for you to be sent home with false labor. You may continue to have Braxton Hicks contractions until you go into true labor. How to tell the difference between true labor and false labor True labor  Contractions last 30-70 seconds.  Contractions become very regular.  Discomfort is usually felt in the top of the uterus, and it spreads to the lower abdomen and low back.  Contractions do not go away with walking.  Contractions usually become more intense and increase in frequency.  The cervix dilates and gets thinner. False labor  Contractions are usually shorter and not as strong as true labor contractions.  Contractions are usually irregular.  Contractions  are often felt in the front of the lower abdomen and in the groin.  Contractions may go away when you walk around or change positions while lying down.  Contractions get weaker and are shorter-lasting as time goes on.  The cervix usually does not dilate or become thin. Follow these instructions at home:   Take over-the-counter and prescription medicines only as told by your health care provider.  Keep up with your usual exercises and follow other instructions from your health care provider.  Eat and drink lightly if you think you are going into labor.  If Braxton Hicks contractions are making you uncomfortable: ? Change your position from lying down or resting to walking, or change from walking to resting. ? Sit and rest in a tub of warm water. ? Drink enough fluid to keep your urine pale yellow. Dehydration may cause these contractions. ? Do slow and deep breathing several times an hour.  Keep all follow-up prenatal visits as told by your health care provider. This is important. Contact a health care provider if:  You have a fever.  You have continuous pain in your abdomen. Get help right away if:  Your contractions become stronger, more regular, and closer together.  You have fluid leaking or gushing from your vagina.  You pass blood-tinged mucus (bloody show).  You have bleeding from your vagina.  You have low back pain that you never had before.  You feel your baby's head pushing down and causing pelvic pressure.  Your baby is not moving inside you as much as it used to. Summary  Contractions that occur before labor are   called Braxton Hicks contractions, false labor, or practice contractions.  Braxton Hicks contractions are usually shorter, weaker, farther apart, and less regular than true labor contractions. True labor contractions usually become progressively stronger and regular, and they become more frequent.  Manage discomfort from Braxton Hicks contractions  by changing position, resting in a warm bath, drinking plenty of water, or practicing deep breathing. This information is not intended to replace advice given to you by your health care provider. Make sure you discuss any questions you have with your health care provider. Document Released: 07/02/2016 Document Revised: 01/29/2017 Document Reviewed: 07/02/2016 Elsevier Patient Education  2020 [employer]vier Inc.  

## 2018-12-23 NOTE — MAU Provider Note (Signed)
   S: Ms. Veronica Brooks is a 23 y.o. [phone] at [redacted]w[redacted]d  who presents to MAU today complaining contractions intermittently. She denies vaginal bleeding. She endorses LOF. She reports normal fetal movement.  [place]  O: BP 131/66   Pulse 100   Temp 98.1 F (36.7 C) (Oral)   Resp 18   Ht 5\' 1"  (1.549 m)   Wt 61.3 kg   SpO2 99%   BMI 25.55 kg/m  GENERAL: Well-developed, well-nourished female in no acute distress.  HEAD: Normocephalic, atraumatic.  CHEST: Normal effort of breathing, regular heart rate ABDOMEN: Soft, nontender, gravid  Cervical exam and SSE:  Dilation: 3.5 Effacement (%): 50 Cervical Position: Posterior Station: -2 Exam by:: Derrill Memo, CNM Neg pool, neg fern  Fetal Monitoring: Baseline: 140s Variability: avg LTV Accelerations: + Decelerations: - Contractions: rare   A: [facility]UP at [redacted]w[redacted]d  False labor  Vaginal d/c  P: D/C home with labor/ROM/bldg precautions Call [facility] in [place] if she is still pregnant on Monday to schedule a prenatal visit for next week  Myrtis Ser, [place] 12/23/2018 11:22 PM

## 2018-12-23 NOTE — MAU Note (Signed)
Pt here with c/o irregular, painful contractions and possible LOF. Reports she has been loosing mucous plug, but had a lot of "wetness" on panties. Pt is not wearing pad. Pt reports she had membrane sweep on Tuesday and then again on Wednesday. Was 5cm on Wednesday. Reports good fetal movement.

## 2018-12-26 ENCOUNTER — Encounter (HOSPITAL_COMMUNITY): Payer: Self-pay

## 2018-12-26 ENCOUNTER — Other Ambulatory Visit: Payer: Self-pay

## 2018-12-26 ENCOUNTER — Inpatient Hospital Stay (HOSPITAL_COMMUNITY)
Admission: AD | Admit: 2018-12-26 | Discharge: 2018-12-26 | Disposition: A | Discharge status: Home / Self Care | Payer: Medicaid Other | Attending: Obstetrics and Gynecology | Admitting: Obstetrics and Gynecology

## 2018-12-26 DIAGNOSIS — O471 False labor at or after 37 completed weeks of gestation: Secondary | ICD-10-CM | POA: Diagnosis present

## 2018-12-26 DIAGNOSIS — O479 False labor, unspecified: Secondary | ICD-10-CM | POA: Diagnosis not present

## 2018-12-26 DIAGNOSIS — Z3A39 39 weeks gestation of pregnancy: Secondary | ICD-10-CM | POA: Insufficient documentation

## 2018-12-26 DIAGNOSIS — Z3689 Encounter for other specified antenatal screening: Secondary | ICD-10-CM | POA: Diagnosis not present

## 2018-12-26 NOTE — MAU Provider Note (Signed)
S: Ms. Veronica Brooks is a 23 y.o. [phone] at [redacted]w[redacted]d  who presents to MAU today for labor evaluation.     Cervical exam by RN: no cervical change after reassessment  Dilation: 3.5 Effacement (%): 50 Cervical Position: Posterior, Middle Exam by:: Glena Norfolk, RN  Fetal Monitoring: Baseline: 140 Variability: moderate  Accelerations: present  Decelerations: none  Contractions: 6-7 minutes   MDM Discussed patient with RN. NST reviewed.   A: [facility]UP at [redacted]w[redacted]d  False labor  P: Discharge home Labor precautions and kick counts included in AVS Patient to [facility] with [facility] as scheduled  Patient may return to MAU as needed or when in labor   Lajean Manes, [place] 12/26/2018 9:58 PM

## 2018-12-26 NOTE — Discharge Instructions (Signed)

## 2018-12-26 NOTE — MAU Note (Signed)
Reports having occasional contractions and lower abdominal pressure off and on throughout the day.  Saw some pink discharge after cooking dinner.  Endorses + FM.  No SROM.

## 2018-12-26 NOTE — MAU Note (Signed)
Spoke to [employer] about cervical exam and she instructed me to recheck on 1 hour from first exam.

## 2019-01-01 ENCOUNTER — Inpatient Hospital Stay (HOSPITAL_COMMUNITY)
Admission: AD | Admit: 2019-01-01 | Discharge: 2019-01-04 | DRG: 798 | Disposition: A | Discharge status: Home / Self Care | Payer: Medicaid Other | Attending: Obstetrics & Gynecology | Admitting: Obstetrics & Gynecology

## 2019-01-01 ENCOUNTER — Encounter (HOSPITAL_COMMUNITY): Payer: Self-pay | Admitting: *Deleted

## 2019-01-01 ENCOUNTER — Other Ambulatory Visit: Payer: Self-pay

## 2019-01-01 DIAGNOSIS — Z20828 Contact with and (suspected) exposure to other viral communicable diseases: Secondary | ICD-10-CM | POA: Diagnosis present

## 2019-01-01 DIAGNOSIS — O99824 Streptococcus B carrier state complicating childbirth: Principal | ICD-10-CM | POA: Diagnosis present

## 2019-01-01 DIAGNOSIS — Z3A40 40 weeks gestation of pregnancy: Secondary | ICD-10-CM | POA: Diagnosis not present

## 2019-01-01 DIAGNOSIS — G40909 Epilepsy, unspecified, not intractable, without status epilepticus: Secondary | ICD-10-CM | POA: Diagnosis present

## 2019-01-01 DIAGNOSIS — Z302 Encounter for sterilization: Secondary | ICD-10-CM | POA: Diagnosis not present

## 2019-01-01 DIAGNOSIS — O99354 Diseases of the nervous system complicating childbirth: Secondary | ICD-10-CM | POA: Diagnosis present

## 2019-01-01 DIAGNOSIS — Z9851 Tubal ligation status: Secondary | ICD-10-CM

## 2019-01-01 DIAGNOSIS — O480 Post-term pregnancy: Secondary | ICD-10-CM | POA: Diagnosis not present

## 2019-01-01 DIAGNOSIS — Z87891 Personal history of nicotine dependence: Secondary | ICD-10-CM

## 2019-01-01 DIAGNOSIS — O26893 Other specified pregnancy related conditions, third trimester: Secondary | ICD-10-CM | POA: Diagnosis present

## 2019-01-01 DIAGNOSIS — O99820 Streptococcus B carrier state complicating pregnancy: Secondary | ICD-10-CM

## 2019-01-01 LAB — CBC
HCT: 36.4 % (ref 36.0–46.0)
HCT: 32.7 % — ABNORMAL LOW (ref 36.0–46.0)
Hemoglobin: 11.6 g/dL — ABNORMAL LOW (ref 12.0–15.0)
Hemoglobin: 10.7 g/dL — ABNORMAL LOW (ref 12.0–15.0)
MCH: 29.6 pg (ref 26.0–34.0)
MCH: 30.1 pg (ref 26.0–34.0)
MCHC: 31.9 g/dL (ref 30.0–36.0)
MCHC: 32.7 g/dL (ref 30.0–36.0)
MCV: 92.9 fL (ref 80.0–100.0)
MCV: 92.1 fL (ref 80.0–100.0)
Platelets: 213 10*3/uL (ref 150–400)
Platelets: 208 10*3/uL (ref 150–400)
RBC: 3.92 MIL/uL (ref 3.87–5.11)
RBC: 3.55 MIL/uL — ABNORMAL LOW (ref 3.87–5.11)
RDW: 15 % (ref 11.5–15.5)
RDW: 14.8 % (ref 11.5–15.5)
WBC: 13.4 10*3/uL — ABNORMAL HIGH (ref 4.0–10.5)
WBC: 21.9 10*3/uL — ABNORMAL HIGH (ref 4.0–10.5)
nRBC: 0 % (ref 0.0–0.2)
nRBC: 0 % (ref 0.0–0.2)

## 2019-01-01 LAB — TYPE AND SCREEN
ABO/RH(D): O POS
Antibody Screen: NEGATIVE

## 2019-01-01 LAB — SARS CORONAVIRUS 2 BY RT PCR (HOSPITAL ORDER, PERFORMED IN ~~LOC~~ HOSPITAL LAB): SARS Coronavirus 2: NEGATIVE

## 2019-01-01 LAB — ABO/RH: ABO/RH(D): O POS

## 2019-01-01 MED ORDER — BENZOCAINE-MENTHOL 20-0.5 % EX AERO
1.0000 "application " | INHALATION_SPRAY | CUTANEOUS | Status: DC | PRN
  Administered 2019-01-01: 1 via TOPICAL
  Filled 2019-01-01: qty 56

## 2019-01-01 MED ORDER — ACETAMINOPHEN 325 MG PO TABS
650.0000 mg | ORAL_TABLET | ORAL | Status: DC | PRN
  Administered 2019-01-02 – 2019-01-03 (×2): 650 mg via ORAL
  Filled 2019-01-01 (×3): qty 2

## 2019-01-01 MED ORDER — TETANUS-DIPHTH-ACELL PERTUSSIS 5-2.5-18.5 LF-MCG/0.5 IM SUSP: 0.5000 mL | Freq: Once | INTRAMUSCULAR | Status: DC

## 2019-01-01 MED ORDER — ACETAMINOPHEN 325 MG PO TABS: 650.0000 mg | ORAL_TABLET | ORAL | Status: DC | PRN

## 2019-01-01 MED ORDER — LIDOCAINE HCL (PF) 1 % IJ SOLN: 30.0000 mL | INTRAMUSCULAR | Status: DC | PRN

## 2019-01-01 MED ORDER — SOD CITRATE-CITRIC ACID 500-334 MG/5ML PO SOLN: 30.0000 mL | ORAL | Status: DC | PRN

## 2019-01-01 MED ORDER — OXYCODONE-ACETAMINOPHEN 5-325 MG PO TABS: 1.0000 | ORAL_TABLET | ORAL | Status: DC | PRN

## 2019-01-01 MED ORDER — SODIUM CHLORIDE 0.9 % IV SOLN: 5.0000 10*6.[IU] | Freq: Once | INTRAVENOUS | Status: DC

## 2019-01-01 MED ORDER — ONDANSETRON HCL 4 MG/2ML IJ SOLN: 4.0000 mg | Freq: Four times a day (QID) | INTRAMUSCULAR | Status: DC | PRN

## 2019-01-01 MED ORDER — METHYLERGONOVINE MALEATE 0.2 MG/ML IJ SOLN
INTRAMUSCULAR | Status: AC
  Administered 2019-01-01: 18:00:00 0.2 mg via INTRAMUSCULAR
  Filled 2019-01-01: qty 1

## 2019-01-01 MED ORDER — METHYLERGONOVINE MALEATE 0.2 MG/ML IJ SOLN
0.2000 mg | Freq: Once | INTRAMUSCULAR | Status: AC
  Administered 2019-01-01: 18:00:00 0.2 mg via INTRAMUSCULAR

## 2019-01-01 MED ORDER — OXYCODONE-ACETAMINOPHEN 5-325 MG PO TABS: 2.0000 | ORAL_TABLET | ORAL | Status: DC | PRN

## 2019-01-01 MED ORDER — SIMETHICONE 80 MG PO CHEW
80.0000 mg | CHEWABLE_TABLET | ORAL | Status: DC | PRN
  Administered 2019-01-03: 20:00:00 80 mg via ORAL
  Filled 2019-01-01: qty 1

## 2019-01-01 MED ORDER — COCONUT OIL OIL: 1.0000 "application " | TOPICAL_OIL | Status: DC | PRN

## 2019-01-01 MED ORDER — SODIUM CHLORIDE 0.9 % IV SOLN
2.0000 g | Freq: Once | INTRAVENOUS | Status: AC
  Administered 2019-01-01: 12:00:00 2 g via INTRAVENOUS
  Filled 2019-01-01: qty 2000

## 2019-01-01 MED ORDER — OXYTOCIN 40 UNITS IN NORMAL SALINE INFUSION - SIMPLE MED
2.5000 [IU]/h | INTRAVENOUS | Status: DC
  Administered 2019-01-01: 17:00:00 2.5 [IU]/h via INTRAVENOUS
  Filled 2019-01-01: qty 1000

## 2019-01-01 MED ORDER — DIPHENHYDRAMINE HCL 25 MG PO CAPS: 25.0000 mg | ORAL_CAPSULE | Freq: Four times a day (QID) | ORAL | Status: DC | PRN

## 2019-01-01 MED ORDER — FENTANYL CITRATE (PF) 100 MCG/2ML IJ SOLN
INTRAMUSCULAR | Status: AC
  Administered 2019-01-01: 12:00:00 100 ug via INTRAVENOUS
  Filled 2019-01-01: qty 2

## 2019-01-01 MED ORDER — LACTATED RINGERS IV SOLN: 500.0000 mL | INTRAVENOUS | Status: DC | PRN

## 2019-01-01 MED ORDER — WITCH HAZEL-GLYCERIN EX PADS: 1.0000 "application " | MEDICATED_PAD | CUTANEOUS | Status: DC | PRN

## 2019-01-01 MED ORDER — ONDANSETRON HCL 4 MG/2ML IJ SOLN: 4.0000 mg | INTRAMUSCULAR | Status: DC | PRN

## 2019-01-01 MED ORDER — ONDANSETRON HCL 4 MG PO TABS: 4.0000 mg | ORAL_TABLET | ORAL | Status: DC | PRN

## 2019-01-01 MED ORDER — DIBUCAINE (PERIANAL) 1 % EX OINT: 1.0000 "application " | TOPICAL_OINTMENT | CUTANEOUS | Status: DC | PRN

## 2019-01-01 MED ORDER — OXYTOCIN BOLUS FROM INFUSION
500.0000 mL | Freq: Once | INTRAVENOUS | Status: AC
  Administered 2019-01-01: 17:00:00 500 mL via INTRAVENOUS

## 2019-01-01 MED ORDER — LACTATED RINGERS IV SOLN
INTRAVENOUS | Status: DC
  Administered 2019-01-01: 12:00:00 via INTRAVENOUS

## 2019-01-01 MED ORDER — PRENATAL MULTIVITAMIN CH
1.0000 | ORAL_TABLET | Freq: Every day | ORAL | Status: DC
  Administered 2019-01-02: 12:00:00 1 via ORAL
  Filled 2019-01-01: qty 1

## 2019-01-01 MED ORDER — ZOLPIDEM TARTRATE 5 MG PO TABS: 5.0000 mg | ORAL_TABLET | Freq: Every evening | ORAL | Status: DC | PRN

## 2019-01-01 MED ORDER — IBUPROFEN 600 MG PO TABS
600.0000 mg | ORAL_TABLET | Freq: Four times a day (QID) | ORAL | Status: DC
  Administered 2019-01-01 – 2019-01-04 (×8): 600 mg via ORAL
  Filled 2019-01-01 (×9): qty 1

## 2019-01-01 MED ORDER — SENNOSIDES-DOCUSATE SODIUM 8.6-50 MG PO TABS
2.0000 | ORAL_TABLET | ORAL | Status: DC
  Administered 2019-01-02 – 2019-01-03 (×3): 2 via ORAL
  Filled 2019-01-01 (×3): qty 2

## 2019-01-01 MED ORDER — FENTANYL CITRATE (PF) 100 MCG/2ML IJ SOLN
100.0000 ug | INTRAMUSCULAR | Status: DC | PRN
  Administered 2019-01-01 (×2): 100 ug via INTRAVENOUS
  Filled 2019-01-01: qty 2

## 2019-01-01 NOTE — H&P (Signed)
Veronica Brooks is a 23 y.o. female presenting for labor evaluation and was admitted at 6 cm dilation. She received care in [place], [place], records available.   Pregnancy complicated by hx seizure disorder, not on medications with last seizure 2+ years ago; short interval between pregnancies, IUGR earlier in pregnancy that resolved, and echolucent area, likely artifact, between infant lung and heart with plan for chest xray postnatally; and UTI x 3 on prophylaxis.  EFW on MFM [place] on 12/15/18 of 2883 gm, 18th percentile with normal interval growth from previous US.  OB History    Gravida  4   Para  3   Term  3   Preterm      AB      Living  3     SAB      TAB      Ectopic      Multiple  0   Live Births  3          Past Medical History:  Diagnosis Date  . Acetaminophen overdose, intentional self-harm, initial encounter ([place]) 03/08/2016  . Bronchitis   . Depression   . Heart murmur    Does not require cardiologist   Past Surgical History:  Procedure Laterality Date  . NO PAST SURGERIES     Family History: family history includes Cancer in her maternal grandfather; Diabetes in her maternal grandmother. Social History:  reports that she has quit smoking. Her smoking use included cigarettes. She has never used smokeless tobacco. She reports that she does not drink alcohol or use drugs.     Maternal Diabetes: No Genetic Screening: Normal Maternal Ultrasounds/Referrals: Normal and Other: Fetal Ultrasounds or other Referrals:  None Maternal Substance Abuse:  No Significant Maternal Medications:  None Significant Maternal Lab Results:  Group B Strep positive Other Comments:  echolucent area on fetal US, recommend CXR for newborn after delivery  Review of Systems  Constitutional: Negative for chills and fever.  Respiratory: Negative for shortness of breath.   Cardiovascular: Negative for chest pain.  Gastrointestinal: Positive for abdominal pain. Negative for  constipation, diarrhea and vomiting.  Neurological: Negative for dizziness and headaches.  All other systems reviewed and are negative.  Maternal Medical History:  Reason for admission: Contractions.   Contractions: Onset was 3-5 hours ago.   Frequency: regular.   Perceived severity is strong.    Fetal activity: Perceived fetal activity is normal.   Last perceived fetal movement was within the past hour.    Prenatal complications: no prenatal complications Prenatal Complications - Diabetes: none.      Blood pressure 119/74, pulse 99, temperature 98.2 F (36.8 C), temperature source Oral, resp. rate 18, SpO2 99 %, unknown if currently breastfeeding. Maternal Exam:  Uterine Assessment: Contraction strength is moderate.  Contraction frequency is regular.   Abdomen: Fetal presentation: vertex  Cervix: Cervix evaluated by digital exam.     Fetal Exam Fetal Monitor Review: Mode: ultrasound.   Baseline rate: 125.  Variability: moderate (6-25 bpm).   Pattern: accelerations present and no decelerations.    Fetal State Assessment: Category I - tracings are normal.     Physical Exam  Nursing note and vitals reviewed. Constitutional: She is oriented to person, place, and time. She appears well-developed and well-nourished.  Neck: Normal range of motion.  Cardiovascular: Normal rate, regular rhythm and normal heart sounds.  Respiratory: Effort normal and breath sounds normal.  GI: Soft.  Musculoskeletal: Normal range of motion.  Neurological: She is alert and oriented  to person, place, and time.  Skin: Skin is warm and dry.  Psychiatric: She has a normal mood and affect. Her behavior is normal. Judgment and thought content normal.    Prenatal labs: ABO, Rh: --/--/O POS (12/15 0410) Antibody: NEG (12/15 0410) Rubella:  immune RPR: Non Reactive (12/15 0410)  HBsAg:   neg HIV:   nonreactive GBS: Positive/-- (11/24 0000)   Assessment/Plan: [id number] at [redacted]w[redacted]d in active  labor at term GBS positive  Admit to L&D Expectant management IV pain medications as desired Anticipate NSVD Pt desires BTL with papers signed per her prenatal record at 55 weeks   Sharen Counter 01/01/2019, 11:38 AM

## 2019-01-01 NOTE — Progress Notes (Signed)
Veronica Brooks is a 23 y.o. G4P3003 at [redacted]w[redacted]d by ultrasound admitted for active labor  Subjective: Pt breathing with contractions, IV  Pain medication helping pt cope with pain, desires to continue IV pain medication.  Objective: BP 120/69   Pulse 93   Temp 98.5 F (36.9 C) (Oral)   Resp 18   SpO2 99%  No intake/output data recorded. No intake/output data recorded.  FHT:  FHR: 125 bpm, variability: moderate,  accelerations:  Present,  decelerations:  Absent UC:   regular, every 3-4 minutes SVE:   Dilation: 7.5 Effacement (%): 80 Station: -1, 0 Exam by:: Carleene Overlie RNC   Labs: Lab Results  Component Value Date   WBC 13.4 (H) 01/01/2019   HGB 11.6 (L) 01/01/2019   HCT 36.4 01/01/2019   MCV 92.9 01/01/2019   PLT 213 01/01/2019    Assessment / Plan: Spontaneous labor, progressing normally  Labor: Progressing normally Preeclampsia:  n/a Fetal Wellbeing:  Category I Pain Control:  IV pain meds I/D:  GBS positive, ampicillin given on admission at 1213 Anticipated MOD:  NSVD  Fatima Blank 01/01/2019, 2:13 PM

## 2019-01-01 NOTE — MAU Note (Signed)
Veronica Brooks is a 23 y.o. at [redacted]w[redacted]d here in MAU reporting: contractions since yesterday, now they are less than 7 minutes apart. States she is seeing some bleeding when she uses the bathroom when she wipes, provider told her it may be from her urine. Having white discharge, states it is more then normal. No LOF. Reporting decreased fetal movement since contractions started. Was 3cm at last VE.  Onset of complaint: yesterday  Pain score: 10/10  Vitals:   01/01/19 1103  BP: 119/74  Pulse: 99  Resp: 18  Temp: 98.2 F (36.8 C)  SpO2: 99%     FHT: 145  Lab orders placed from triage: none

## 2019-01-01 NOTE — Discharge Summary (Signed)
Postpartum Discharge Summary      Patient Name: Veronica Brooks DOB: Jan 03, 1996 MRN: [id number]  Date of admission: 01/01/2019 Delivering Provider: Fatima Blank A   Date of discharge: 01/03/2019  Admitting diagnosis: 40weeks contractions Intrauterine pregnancy: [redacted]w[redacted]d    Secondary diagnosis:  Active Problems:   Normal labor   NSVD (normal spontaneous vaginal delivery)  Additional problems: Undesired fertility     Discharge diagnosis: Term Pregnancy Delivered                                                                                                Post partum procedures:postpartum tubal ligation  Augmentation: AROM  Complications: None  Hospital course:  Onset of Labor With Vaginal Delivery     23y.o. yo [id number] admitted in Active Labor on 01/01/2019. Patient had an uncomplicated labor course as follows:  Membrane Rupture Time/Date: 4:37 PM ,01/01/2019   Intrapartum Procedures: Episiotomy: None [1]                                         Lacerations:  Labial [10]  Patient had a delivery of a Viable infant. 01/01/2019  Information for the patient's newborn:  SiHorace, Wishonirl Marytza [[id number]]     Pateint had an uncomplicated postpartum course.  She is ambulating, tolerating a regular diet, passing flatus, and urinating well. Patient is discharged home in stable condition on 01/03/19.  Delivery time: 4:54 PM    Magnesium Sulfate received: No BMZ received: No Rhophylac:No MMR:No Transfusion:No  Physical exam  Vitals:   01/03/19 1730 01/03/19 1745 01/03/19 1800 01/03/19 1815  BP: 110/74 113/79 116/78 124/81  Pulse: 65 75 71 82  Resp: _0 Temp:      TempSrc:      SpO2: 95% 98% 99% 99%   General: alert, cooperative and no distress Lochia: appropriate Uterine Fundus: firm Incision: Dressing is clean, dry, and intact DVT Evaluation: No evidence of DVT seen on physical exam. Labs: Lab Results  Component Value Date   WBC 20.0 (H)  01/02/2019   HGB 9.2 (L) 01/02/2019   HCT 28.7 (L) 01/02/2019   MCV 94.4 01/02/2019   PLT 215 01/02/2019   CMP Latest Ref Rng & Units 09/12/2018  Glucose 70 - 99 mg/dL 90  BUN 6 - 20 mg/dL 7  Creatinine 0.44 - 1.00 mg/dL 0.47  Sodium 135 - 145 mmol/L 134(L)  Potassium 3.5 - 5.1 mmol/L 3.2(L)  Chloride 98 - 111 mmol/L 101  CO2 22 - 32 mmol/L 24  Calcium 8.9 - 10.3 mg/dL 8.6(L)  Total Protein 6.5 - 8.1 g/dL -  Total Bilirubin 0.3 - 1.2 mg/dL -  Alkaline Phos 38 - 126 U/L -  AST 15 - 41 U/L -  ALT 0 - 44 U/L -    Discharge instruction: per After Visit Summary and "Baby and Me Booklet".  After visit meds:  Allergies as of 01/03/2019      Reactions   Latex  Rash, Other (See Comments)   Reaction:  Burning       Medication List    TAKE these medications   acetaminophen 325 MG tablet Commonly known as: TYLENOL Take 650 mg by mouth every 6 (six) hours as needed for mild pain or headache.   albuterol 108 (90 Base) MCG/ACT inhaler Commonly known as: VENTOLIN HFA Inhale 2 puffs into the lungs every 4 (four) hours as needed for wheezing or shortness of breath.   famotidine 40 MG tablet Commonly known as: PEPCID Take 40 mg by mouth daily as needed for heartburn or indigestion.   ferrous sulfate 325 (65 FE) MG tablet Take 1 tablet (325 mg total) by mouth 2 (two) times daily with a meal.   ibuprofen 600 MG tablet Commonly known as: ADVIL Take 1 tablet (600 mg total) by mouth every 6 (six) hours.   oxyCODONE 5 MG immediate release tablet Commonly known as: Oxy IR/ROXICODONE Take 1-2 tablets (5-10 mg total) by mouth every 4 (four) hours as needed for moderate pain, severe pain or breakthrough pain.   prenatal multivitamin Tabs tablet Take 1 tablet by mouth daily at 12 noon.       Diet: routine diet  Activity: Advance as tolerated. Pelvic rest for 6 weeks.   Outpatient follow up:6 weeks Follow up Appt:No future appointments. Follow up Visit:  Pt received prenatal care  in [facility] and will contact her office to schedule postpartum visit.      Newborn Data: Live born female  Birth Weight: 6 lb 14.1 oz (3120 g) APGAR: 38, 9  Newborn Delivery   Birth date/time: 01/01/2019 16:54:00 Delivery type: Vaginal, Spontaneous      Baby Feeding: Bottle Disposition:home with mother   01/03/2019 Merilyn Baba, DO

## 2019-01-02 LAB — CBC
HCT: 28.7 % — ABNORMAL LOW (ref 36.0–46.0)
Hemoglobin: 9.2 g/dL — ABNORMAL LOW (ref 12.0–15.0)
MCH: 30.3 pg (ref 26.0–34.0)
MCHC: 32.1 g/dL (ref 30.0–36.0)
MCV: 94.4 fL (ref 80.0–100.0)
Platelets: 215 10*3/uL (ref 150–400)
RBC: 3.04 MIL/uL — ABNORMAL LOW (ref 3.87–5.11)
RDW: 15.2 % (ref 11.5–15.5)
WBC: 20 10*3/uL — ABNORMAL HIGH (ref 4.0–10.5)
nRBC: 0 % (ref 0.0–0.2)

## 2019-01-02 LAB — RPR: RPR Ser Ql: NONREACTIVE

## 2019-01-02 MED ORDER — FERROUS SULFATE 325 (65 FE) MG PO TABS
325.0000 mg | ORAL_TABLET | Freq: Two times a day (BID) | ORAL | Status: DC
  Administered 2019-01-02 – 2019-01-04 (×3): 325 mg via ORAL
  Filled 2019-01-02 (×3): qty 1

## 2019-01-02 NOTE — Progress Notes (Signed)
CSW received consult for history of depression with prior suicide attempt in 2018.  CSW met with MOB to offer support and complete assessment.    MOB laying in bed with infant asleep at bedside, when CSW entered the room. CSW introduced self and explained reason for consult to which MOB expressed understanding. MOB pleasant and engaged throughout assessment. CSW inquired about MOB's mental health history and MOB initially seemed hesitant to acknowledge having a mental health history. CSW asked MOB if she had a history of depression and MOB stated that she did but was unable to tell me when symptoms started just that she's had it for a while. MOB denied experiencing any symptoms during pregnancy and when asked about PPD/PPA, MOB reported if she did she didn't know. CSW provided education regarding the baby blues period vs. perinatal mood disorders, discussed treatment and gave resources for mental health follow up if concerns arise.  CSW recommends self-evaluation during the postpartum time period using the New Mom Checklist from Postpartum Progress and encouraged MOB to contact a medical professional if symptoms are noted at any time. MOB did not appear to be displaying any acute mental health symptoms and denied any current SI, HI or DV. CSW addressed prior suicide attempt to which MOB acknowledged but denied any suicidal thoughts, attempts or intents since.  MOB confirmed having all essential items for infant once discharged and stated infant would be sleeping in a crib once home. CSW provided review of Sudden Infant Death Syndrome (SIDS) precautions and safe sleeping habits.    CSW identifies no further need for intervention and no barriers to discharge at this time.  Elijio Miles, [place]  [facility] [employer] [phone]

## 2019-01-02 NOTE — Progress Notes (Signed)
Post Partum Day 1 Subjective: no complaints, up ad lib, voiding, tolerating PO and + flatus  Objective: Blood pressure (!) 116/56, pulse 86, temperature 98.4 F (36.9 C), temperature source Oral, resp. rate 18, SpO2 98 %, unknown if currently breastfeeding.  Physical Exam:  General: alert, cooperative and no distress Lochia: appropriate Uterine Fundus: firm Incision: n/a DVT Evaluation: No evidence of DVT seen on physical exam.  Recent Labs    01/01/19 1905 01/02/19 0527  HGB 10.7* 9.2*  HCT 32.7* 28.7*    Assessment/Plan: Plan for discharge tomorrow, Breastfeeding and Contraception planning inpatient BTL   Slight drop in Hgb this am. Patient asymptomatic. Will start PO iron   LOS: 1 day   [place] 01/02/2019, 7:10 AM

## 2019-01-03 ENCOUNTER — Inpatient Hospital Stay (HOSPITAL_COMMUNITY): Payer: Medicaid Other | Admitting: Anesthesiology

## 2019-01-03 ENCOUNTER — Encounter (HOSPITAL_COMMUNITY)
Admission: AD | Disposition: A | Discharge status: Home / Self Care | Payer: Self-pay | Source: Home / Self Care | Attending: Obstetrics & Gynecology

## 2019-01-03 ENCOUNTER — Other Ambulatory Visit: Payer: Self-pay

## 2019-01-03 DIAGNOSIS — Z302 Encounter for sterilization: Secondary | ICD-10-CM

## 2019-01-03 HISTORY — PX: TUBAL LIGATION: SHX77

## 2019-01-03 SURGERY — LIGATION, FALLOPIAN TUBE, POSTPARTUM
Anesthesia: Choice | Wound class: Clean Contaminated

## 2019-01-03 MED ORDER — MIDAZOLAM HCL 5 MG/5ML IJ SOLN
INTRAMUSCULAR | Status: DC | PRN
  Administered 2019-01-03: 0.5 mg via INTRAVENOUS
  Administered 2019-01-03 (×2): 1 mg via INTRAVENOUS
  Administered 2019-01-03: 0.5 mg via INTRAVENOUS

## 2019-01-03 MED ORDER — PHENYLEPHRINE HCL (PRESSORS) 10 MG/ML IV SOLN
INTRAVENOUS | Status: DC | PRN
  Administered 2019-01-03 (×2): 80 ug via INTRAVENOUS

## 2019-01-03 MED ORDER — FENTANYL CITRATE (PF) 100 MCG/2ML IJ SOLN
INTRAMUSCULAR | Status: DC | PRN
  Administered 2019-01-03: 10 ug via INTRATHECAL

## 2019-01-03 MED ORDER — IBUPROFEN 600 MG PO TABS: 600.0000 mg | ORAL_TABLET | Freq: Four times a day (QID) | ORAL | 0 refills | Status: DC

## 2019-01-03 MED ORDER — METOCLOPRAMIDE HCL 10 MG PO TABS
10.0000 mg | ORAL_TABLET | Freq: Once | ORAL | Status: AC
  Administered 2019-01-03: 12:00:00 10 mg via ORAL
  Filled 2019-01-03: qty 1

## 2019-01-03 MED ORDER — DEXAMETHASONE SODIUM PHOSPHATE 4 MG/ML IJ SOLN
INTRAMUSCULAR | Status: DC | PRN
  Administered 2019-01-03: 4 mg via INTRAVENOUS

## 2019-01-03 MED ORDER — FENTANYL CITRATE (PF) 100 MCG/2ML IJ SOLN
INTRAMUSCULAR | Status: DC | PRN
  Administered 2019-01-03: 25 ug via INTRAVENOUS
  Administered 2019-01-03: 40 ug via INTRAVENOUS
  Administered 2019-01-03: 25 ug via INTRAVENOUS
  Administered 2019-01-03: 50 ug via INTRAVENOUS

## 2019-01-03 MED ORDER — FENTANYL CITRATE (PF) 100 MCG/2ML IJ SOLN
INTRAMUSCULAR | Status: AC
  Filled 2019-01-03: qty 2

## 2019-01-03 MED ORDER — MIDAZOLAM HCL 2 MG/2ML IJ SOLN
INTRAMUSCULAR | Status: AC
  Filled 2019-01-03: qty 2

## 2019-01-03 MED ORDER — FERROUS SULFATE 325 (65 FE) MG PO TABS: 325.0000 mg | ORAL_TABLET | Freq: Two times a day (BID) | ORAL | 3 refills | Status: DC

## 2019-01-03 MED ORDER — BUPIVACAINE IN DEXTROSE 0.75-8.25 % IT SOLN
INTRATHECAL | Status: DC | PRN
  Administered 2019-01-03: 12 mL via INTRATHECAL

## 2019-01-03 MED ORDER — DEXAMETHASONE SODIUM PHOSPHATE 4 MG/ML IJ SOLN
INTRAMUSCULAR | Status: AC
  Filled 2019-01-03: qty 1

## 2019-01-03 MED ORDER — METOCLOPRAMIDE HCL 5 MG/ML IJ SOLN
INTRAMUSCULAR | Status: AC
  Filled 2019-01-03: qty 2

## 2019-01-03 MED ORDER — BUPIVACAINE HCL (PF) 0.25 % IJ SOLN
INTRAMUSCULAR | Status: DC | PRN
  Administered 2019-01-03: 30 mL

## 2019-01-03 MED ORDER — LACTATED RINGERS IV SOLN
INTRAVENOUS | Status: DC
  Administered 2019-01-03: 12:00:00 via INTRAVENOUS

## 2019-01-03 MED ORDER — FAMOTIDINE 20 MG PO TABS
40.0000 mg | ORAL_TABLET | Freq: Once | ORAL | Status: AC
  Administered 2019-01-03: 12:00:00 40 mg via ORAL
  Filled 2019-01-03: qty 2

## 2019-01-03 MED ORDER — FENTANYL CITRATE (PF) 100 MCG/2ML IJ SOLN: 25.0000 ug | INTRAMUSCULAR | Status: DC | PRN

## 2019-01-03 MED ORDER — SUCCINYLCHOLINE CHLORIDE 200 MG/10ML IV SOSY
PREFILLED_SYRINGE | INTRAVENOUS | Status: AC
  Filled 2019-01-03: qty 10

## 2019-01-03 MED ORDER — PHENYLEPHRINE 40 MCG/ML (10ML) SYRINGE FOR IV PUSH (FOR BLOOD PRESSURE SUPPORT)
PREFILLED_SYRINGE | INTRAVENOUS | Status: AC
  Filled 2019-01-03: qty 10

## 2019-01-03 MED ORDER — PROMETHAZINE HCL 25 MG/ML IJ SOLN: 6.2500 mg | INTRAMUSCULAR | Status: DC | PRN

## 2019-01-03 MED ORDER — BUPIVACAINE HCL (PF) 0.25 % IJ SOLN
INTRAMUSCULAR | Status: AC
  Filled 2019-01-03: qty 30

## 2019-01-03 MED ORDER — LIDOCAINE 2% (20 MG/ML) 5 ML SYRINGE
INTRAMUSCULAR | Status: AC
  Filled 2019-01-03: qty 5

## 2019-01-03 MED ORDER — ONDANSETRON HCL 4 MG/2ML IJ SOLN
INTRAMUSCULAR | Status: AC
  Filled 2019-01-03: qty 2

## 2019-01-03 MED ORDER — LACTATED RINGERS IV SOLN
INTRAVENOUS | Status: DC | PRN
  Administered 2019-01-03 (×3): via INTRAVENOUS

## 2019-01-03 MED ORDER — OXYCODONE HCL 5 MG PO TABS: 5.0000 mg | ORAL_TABLET | ORAL | 0 refills | Status: DC | PRN

## 2019-01-03 MED ORDER — OXYCODONE HCL 5 MG PO TABS: 5.0000 mg | ORAL_TABLET | ORAL | Status: DC | PRN

## 2019-01-03 MED ORDER — ONDANSETRON HCL 4 MG/2ML IJ SOLN
INTRAMUSCULAR | Status: DC | PRN
  Administered 2019-01-03: 4 mg via INTRAVENOUS

## 2019-01-03 SURGICAL SUPPLY — 26 items
BLADE SURG 11 STRL SS (BLADE) ×3 IMPLANT
DRESSING OPSITE X SMALL 2X3 (GAUZE/BANDAGES/DRESSINGS) ×3 IMPLANT
DRSG OPSITE POSTOP 3X4 (GAUZE/BANDAGES/DRESSINGS) ×3 IMPLANT
DURAPREP 26ML APPLICATOR (WOUND CARE) ×3 IMPLANT
GLOVE BIOGEL PI IND STRL 7.0 (GLOVE) ×1 IMPLANT
GLOVE BIOGEL PI IND STRL 7.5 (GLOVE) ×1 IMPLANT
GLOVE BIOGEL PI INDICATOR 7.0 (GLOVE) ×2
GLOVE BIOGEL PI INDICATOR 7.5 (GLOVE) ×2
GLOVE ECLIPSE 7.5 STRL STRAW (GLOVE) ×3 IMPLANT
GOWN STRL REUS W/TWL LRG LVL3 (GOWN DISPOSABLE) ×6 IMPLANT
HIBICLENS CHG 4% 4OZ BTL (MISCELLANEOUS) ×3 IMPLANT
NEEDLE HYPO 22GX1.5 SAFETY (NEEDLE) ×3 IMPLANT
NS IRRIG 1000ML POUR BTL (IV SOLUTION) ×3 IMPLANT
PACK ABDOMINAL MINOR (CUSTOM PROCEDURE TRAY) ×3 IMPLANT
PROTECTOR NERVE ULNAR (MISCELLANEOUS) ×3 IMPLANT
SPONGE LAP 4X18 RFD (DISPOSABLE) IMPLANT
SUT GUT PLAIN 0 CT-3 TAN 27 (SUTURE) ×6 IMPLANT
SUT PLAIN 2 0 (SUTURE) ×2
SUT PLAIN ABS 2-0 54XMFL TIE (SUTURE) ×1 IMPLANT
SUT VIC AB 3-0 SH 27 (SUTURE) ×4
SUT VIC AB 3-0 SH 27X BRD (SUTURE) ×2 IMPLANT
SUT VICRYL 0 UR6 27IN ABS (SUTURE) ×3 IMPLANT
SUT VICRYL 4-0 PS2 18IN ABS (SUTURE) ×3 IMPLANT
SYR CONTROL 10ML LL (SYRINGE) ×3 IMPLANT
TOWEL OR 17X24 6PK STRL BLUE (TOWEL DISPOSABLE) ×6 IMPLANT
TRAY FOLEY W/BAG SLVR 14FR (SET/KITS/TRAYS/PACK) ×3 IMPLANT

## 2019-01-03 NOTE — Progress Notes (Signed)
Post Partum Day 2 Subjective: Doing well. Tolerating a regular diet yesterday. NPO today. Ambulating. Urinating. Would like to discharge later today. Bottle feeding.   Objective: Blood pressure 100/64, pulse 90, temperature 98 F (36.7 C), temperature source Oral, resp. rate 18, SpO2 99 %, unknown if currently breastfeeding.  Physical Exam:  General: alert, cooperative and appears stated age Lochia: appropriate Uterine Fundus: firm DVT Evaluation: No evidence of DVT seen on physical exam.  Recent Labs    01/01/19 1905 01/02/19 0527  HGB 10.7* 9.2*  HCT 32.7* 28.7*    Assessment/Plan: Plan for discharge after tubal Cont ferrous sulfate Bottle feeding Vitals stable   LOS: 2 days   Jeanette Rauth N Malakye Nolden 01/03/2019, 10:45 AM

## 2019-01-03 NOTE — Anesthesia Procedure Notes (Signed)
Spinal  Patient location during procedure: OR Start time: 01/03/2019 2:37 PM End time: 01/03/2019 2:47 PM Staffing Anesthesiologist: Duane Boston, MD Performed: anesthesiologist  Preanesthetic Checklist Completed: patient identified, surgical consent, pre-op evaluation, timeout performed, IV checked, risks and benefits discussed and monitors and equipment checked Spinal Block Patient position: sitting Prep: DuraPrep Patient monitoring: cardiac monitor, continuous pulse ox and blood pressure Approach: midline Location: L2-3 Injection technique: single-shot Needle Needle type: Pencan  Needle gauge: 24 G Needle length: 9 cm Additional Notes Functioning IV was confirmed and monitors were applied. Sterile prep and drape, including hand hygiene and sterile gloves were used. The patient was positioned and the spine was prepped. The skin was anesthetized with lidocaine.  Free flow of clear CSF was obtained prior to injecting local anesthetic into the CSF.  The spinal needle aspirated freely following injection.  The needle was carefully withdrawn.  The patient tolerated the procedure well.

## 2019-01-03 NOTE — Anesthesia Postprocedure Evaluation (Signed)
Anesthesia Post Note  Patient: Veronica Brooks  Procedure(s) Performed: POST PARTUM TUBAL LIGATION (N/A )     Patient location during evaluation: PACU Anesthesia Type: Spinal and MAC Level of consciousness: awake and alert Pain management: pain level controlled Vital Signs Assessment: post-procedure vital signs reviewed and stable Respiratory status: spontaneous breathing and respiratory function stable Cardiovascular status: blood pressure returned to baseline and stable Postop Assessment: spinal receding Anesthetic complications: no    Last Vitals:  Vitals:   01/03/19 1700 01/03/19 1715  BP: 107/71 110/72  Pulse: 63 65  Resp: 16 18  Temp:    SpO2: 96% 98%    Last Pain:  Vitals:   01/03/19 1715  TempSrc:   PainSc: Asleep   Pain Goal: Patients Stated Pain Goal: 2 (01/02/19 0514)              Epidural/Spinal Function Cutaneous sensation: Able to Wiggle Toes (01/03/19 1715), Patient able to flex knees: Yes (01/03/19 1715), Patient able to lift hips off bed: No (01/03/19 1715), Back pain beyond tenderness at insertion site: No (01/03/19 1715), Progressively worsening motor and/or sensory loss: No (01/03/19 1715), Bowel and/or bladder incontinence post epidural: No (01/03/19 1715)  Garry Nicolini DANIEL

## 2019-01-03 NOTE — Transfer of Care (Signed)
Immediate Anesthesia Transfer of Care Note  Patient: Veronica Brooks  Procedure(s) Performed: POST PARTUM TUBAL LIGATION (N/A )  Patient Location: PACU  Anesthesia Type:Spinal  Level of Consciousness: awake, alert  and oriented  Airway & Oxygen Therapy: Patient Spontanous Breathing  Post-op Assessment: Report given to RN and Post -op Vital signs reviewed and stable  Post vital signs: Reviewed and stable  Last Vitals:  Vitals Value Taken Time  BP 102/67 01/03/19 1610  Temp    Pulse 72 01/03/19 1612  Resp 17 01/03/19 1612  SpO2 99 % 01/03/19 1612  Vitals shown include unvalidated device data.  Last Pain:  Vitals:   01/03/19 1210  TempSrc:   PainSc: 0-No pain      Patients Stated Pain Goal: 2 (12/16/49 0258)  Complications: No apparent anesthesia complications

## 2019-01-03 NOTE — Progress Notes (Signed)
Patient ID: Veronica Brooks, female   DOB: 04/22/1995, 23 y.o.   MRN: [id number]  Risks of procedure discussed with patient including but not limited to: risk of regret, permanence of method, bleeding, infection, injury to surrounding organs and need for additional procedures.  Failure risk of 1 -2 % with increased risk of ectopic gestation if pregnancy occurs was also discussed with patient.    Truett Mainland, DO 01/03/2019 12:46 PM

## 2019-01-03 NOTE — Anesthesia Preprocedure Evaluation (Addendum)
Anesthesia Evaluation  Patient identified by MRN, date of birth, ID band Patient awake    Reviewed: Allergy & Precautions, NPO status , Patient's Chart, lab work & pertinent test results  History of Anesthesia Complications Negative for: history of anesthetic complications  Airway Mallampati: II  TM Distance: >3 FB Neck ROM: Full    Dental no notable dental hx. (+) Dental Advisory Given   Pulmonary former smoker,    Pulmonary exam normal        Cardiovascular negative cardio ROS Normal cardiovascular exam     Neuro/Psych Seizures -, Well Controlled,  PSYCHIATRIC DISORDERS Depression    GI/Hepatic negative GI ROS, Neg liver ROS,   Endo/Other  negative endocrine ROS  Renal/GU negative Renal ROS  negative genitourinary   Musculoskeletal negative musculoskeletal ROS (+)   Abdominal   Peds negative pediatric ROS (+)  Hematology negative hematology ROS (+)   Anesthesia Other Findings   Reproductive/Obstetrics negative OB ROS                            Anesthesia Physical Anesthesia Plan  ASA: II  Anesthesia Plan: Spinal   Post-op Pain Management:    Induction:   PONV Risk Score and Plan: 2 and Ondansetron and Treatment may vary due to age or medical condition  Airway Management Planned: Natural Airway  Additional Equipment:   Intra-op Plan:   Post-operative Plan:   Informed Consent: I have reviewed the patients History and Physical, chart, labs and discussed the procedure including the risks, benefits and alternatives for the proposed anesthesia with the patient or authorized representative who has indicated his/her understanding and acceptance.     Dental advisory given  Plan Discussed with: CRNA, Anesthesiologist and Surgeon  Anesthesia Plan Comments:        Anesthesia Quick Evaluation

## 2019-01-03 NOTE — Op Note (Addendum)
Veronica Brooks  01/03/2019  PREOPERATIVE DIAGNOSIS:  Multiparity, undesired fertility  POSTOPERATIVE DIAGNOSIS:  Multiparity, undesired fertility  PROCEDURE:  Postpartum Bilateral Tubal Sterilization using Pomery method.  SURGEON: Dr. Loma Boston DO  ASSISTANT: Dr. Merilyn Baba DO  ANESTHESIA:  Epidural and local analgesia using 7.06% Marcaine  COMPLICATIONS:  None immediate.  ESTIMATED BLOOD LOSS: 5 ml.  INDICATIONS: 23 y.o. [id number]  with undesired fertility,status post vaginal delivery, desires permanent sterilization.  Other reversible forms of contraception were discussed with patient; she declines all other modalities. Risks of procedure discussed with patient including but not limited to: risk of regret, permanence of method, bleeding, infection, injury to surrounding organs and need for additional procedures.  Failure risk of 0.5-1% with increased risk of ectopic gestation if pregnancy occurs was also discussed with patient.     FINDINGS:  Normal uterus, tubes, and ovaries.  PROCEDURE DETAILS: The patient was taken to the operating room where her spinal anesthesia was dosed up to surgical level and found to be adequate.  She was then placed in a supine position and prepped and draped in the usual sterile fashion.  After an adequate timeout was performed, attention was turned to the patient's abdomen where a small transverse skin incision was made under the umbilical fold. The incision was taken down to the layer of fascia using the scalpel, and fascia was incised, and extended bilaterally. The peritoneum was entered in a sharp fashion. The patient was placed in Trendelenburg.  A moist lap pad was used to move omentum and bowel away until the left fallopian tube was identified and grasped with a Babcock clamp, and followed out to the fimbriated end.    The left Fallopian tube was identified, grasped with the Babcock clamps. An avascular midsection of the tube approximately 3-4cm  from the cornua was grasped with the babcock clamps and brought into a knuckle. The tube was double ligated with one 0 plain gut suture and the intervening portion of tube was transected and removed. The suture slid off the ligated part of tube, and bleeding was noted. The ends of the fallopian tube were elevated with Babcocks, then grasped with Kelly clamps until hemostasis was noted, and the remaining ends were ligated with 3-0 Vicryl. The area was re-inspected and excellent hemostasis was noted.  Attention was then turned to the right fallopian tube after confirmation of identification by tracing the tube out to the fimbriae. The same procedure was then performed on the right Fallopian tube, with excellent hemostasis noted at both BTL sites at the end of the procedure.  The instruments were then removed from the patient's abdomen and the fascial incision was repaired with 0 Vicryl, and the skin was closed with a 4-0 Vicryl subcuticular stitch. The patient tolerated the procedure well.  Sponge, lap, and needle counts were correct times two.  The patient was then taken to the recovery room awake, extubated and in stable condition.  Merilyn Baba MD 01/03/2019 3:58 PM

## 2019-01-04 ENCOUNTER — Encounter (HOSPITAL_COMMUNITY): Payer: Self-pay | Admitting: Family Medicine

## 2019-01-04 DIAGNOSIS — Z3A40 40 weeks gestation of pregnancy: Secondary | ICD-10-CM

## 2019-01-04 DIAGNOSIS — Z9851 Tubal ligation status: Secondary | ICD-10-CM

## 2019-01-04 MED ORDER — LACTATED RINGERS IV SOLN
INTRAVENOUS | Status: AC
  Administered 2019-01-04: 01:00:00 via INTRAVENOUS

## 2019-01-04 NOTE — Discharge Summary (Signed)
Postpartum Discharge Summary      Patient Name: Veronica Brooks DOB: 01-17-1996 MRN: [id number]  Date of admission: 01/01/2019 Delivering Provider: Fatima Blank A   Date of discharge: 01/04/2019  Admitting diagnosis: 40weeks contractions Intrauterine pregnancy: [redacted]w[redacted]d    Secondary diagnosis:  Active Problems:   Normal labor   NSVD (normal spontaneous vaginal delivery)   S/P tubal ligation   [redacted] weeks gestation of pregnancy  Additional problems: Undesired fertility     Discharge diagnosis: Term Pregnancy Delivered                                                                                                Post partum procedures:postpartum tubal ligation  Augmentation: AROM  Complications: None  Hospital course:  Onset of Labor With Vaginal Delivery     23y.o. yo [id number] admitted in Active Labor on 01/01/2019. Patient had an uncomplicated labor course as follows:  Membrane Rupture Time/Date: 4:37 PM ,01/01/2019   Intrapartum Procedures: Episiotomy: None [1]                                         Lacerations:  Labial [10]  Patient had a delivery of a Viable infant. 01/01/2019  Information for the patient's newborn:  SiCecille, Mccluskyirl Tala [[id number]]     Pateint had an uncomplicated postpartum course. Received post-partum tubal. To continue ferrous sulfate on discharge. She is ambulating, tolerating a regular diet, passing flatus, and urinating well. Patient is discharged home in stable condition on 01/04/19.  Delivery time: 4:54 PM    Magnesium Sulfate received: No BMZ received: No Rhophylac:No MMR:No Transfusion:No  Physical exam  Vitals:   01/03/19 2339 01/03/19 2346 01/04/19 0418 01/04/19 0810  BP: 115/61  119/64 111/60  Pulse: 80  74 76  Resp: '18 18 18 16  ' Temp: 98.8 F (37.1 C) 98.8 F (37.1 C) 98.4 F (36.9 C) 98.6 F (37 C)  TempSrc: Oral Oral Oral Oral  SpO2: 99%  99% 99%   General: alert, cooperative and no distress Lochia:  appropriate Uterine Fundus: firm Incision: Dressing is clean, dry, and intact DVT Evaluation: No evidence of DVT seen on physical exam. Labs: Lab Results  Component Value Date   WBC 20.0 (H) 01/02/2019   HGB 9.2 (L) 01/02/2019   HCT 28.7 (L) 01/02/2019   MCV 94.4 01/02/2019   PLT 215 01/02/2019   CMP Latest Ref Rng & Units 09/12/2018  Glucose 70 - 99 mg/dL 90  BUN 6 - 20 mg/dL 7  Creatinine 0.44 - 1.00 mg/dL 0.47  Sodium 135 - 145 mmol/L 134(L)  Potassium 3.5 - 5.1 mmol/L 3.2(L)  Chloride 98 - 111 mmol/L 101  CO2 22 - 32 mmol/L 24  Calcium 8.9 - 10.3 mg/dL 8.6(L)  Total Protein 6.5 - 8.1 g/dL -  Total Bilirubin 0.3 - 1.2 mg/dL -  Alkaline Phos 38 - 126 U/L -  AST 15 - 41 U/L -  ALT 0 - 44 U/L -  Discharge instruction: per After Visit Summary and "Baby and Me Booklet".  After visit meds:  Allergies as of 01/04/2019      Reactions   Latex Rash, Other (See Comments)   Reaction:  Burning       Medication List    TAKE these medications   acetaminophen 325 MG tablet Commonly known as: TYLENOL Take 650 mg by mouth every 6 (six) hours as needed for mild pain or headache.   albuterol 108 (90 Base) MCG/ACT inhaler Commonly known as: VENTOLIN HFA Inhale 2 puffs into the lungs every 4 (four) hours as needed for wheezing or shortness of breath.   famotidine 40 MG tablet Commonly known as: PEPCID Take 40 mg by mouth daily as needed for heartburn or indigestion.   ferrous sulfate 325 (65 FE) MG tablet Take 1 tablet (325 mg total) by mouth 2 (two) times daily with a meal.   ibuprofen 600 MG tablet Commonly known as: ADVIL Take 1 tablet (600 mg total) by mouth every 6 (six) hours.   oxyCODONE 5 MG immediate release tablet Commonly known as: Oxy IR/ROXICODONE Take 1-2 tablets (5-10 mg total) by mouth every 4 (four) hours as needed for moderate pain, severe pain or breakthrough pain.   prenatal multivitamin Tabs tablet Take 1 tablet by mouth daily at 12 noon.        Diet: routine diet  Activity: Advance as tolerated. Pelvic rest for 6 weeks.   Outpatient follow up:6 weeks Follow up Appt:No future appointments. Follow up Visit:  Pt received prenatal care in [facility] and will contact her office to schedule postpartum visit.      Newborn Data: Live born female  Birth Weight: 6 lb 14.1 oz (3120 g) APGAR: 11, 9  Newborn Delivery   Birth date/time: 01/01/2019 16:54:00 Delivery type: Vaginal, Spontaneous      Baby Feeding: Bottle Disposition:home with mother   01/04/2019 Chauncey Mann, MD

## 2019-01-05 LAB — SURGICAL PATHOLOGY

## 2019-09-25 ENCOUNTER — Encounter: Payer: Self-pay | Admitting: Emergency Medicine

## 2019-09-25 DIAGNOSIS — R110 Nausea: Secondary | ICD-10-CM | POA: Insufficient documentation

## 2019-09-25 DIAGNOSIS — Z5321 Procedure and treatment not carried out due to patient leaving prior to being seen by health care provider: Secondary | ICD-10-CM | POA: Insufficient documentation

## 2019-09-25 DIAGNOSIS — M545 Low back pain: Secondary | ICD-10-CM | POA: Insufficient documentation

## 2019-09-25 DIAGNOSIS — R1030 Lower abdominal pain, unspecified: Secondary | ICD-10-CM | POA: Insufficient documentation

## 2019-09-25 LAB — CBC
HCT: 36.7 % (ref 36.0–46.0)
Hemoglobin: 12.1 g/dL (ref 12.0–15.0)
MCH: 31.5 pg (ref 26.0–34.0)
MCHC: 33 g/dL (ref 30.0–36.0)
MCV: 95.6 fL (ref 80.0–100.0)
Platelets: 308 10*3/uL (ref 150–400)
RBC: 3.84 MIL/uL — ABNORMAL LOW (ref 3.87–5.11)
RDW: 12.6 % (ref 11.5–15.5)
WBC: 12 10*3/uL — ABNORMAL HIGH (ref 4.0–10.5)
nRBC: 0 % (ref 0.0–0.2)

## 2019-09-25 NOTE — ED Triage Notes (Signed)
Pt arrived via EMS from home where she called out due to lower back pain that radiates into the lower abdomen. Pt sts pain started 2 days ago and makes her feel nauseous and like she need to have a BM.

## 2019-09-26 ENCOUNTER — Emergency Department
Admission: EM | Admit: 2019-09-26 | Discharge: 2019-09-26 | Disposition: A | Discharge status: Home / Self Care | Payer: Medicaid Other | Attending: Emergency Medicine | Admitting: Emergency Medicine

## 2019-09-26 LAB — URINALYSIS, COMPLETE (UACMP) WITH MICROSCOPIC
Bacteria, UA: NONE SEEN
Bilirubin Urine: NEGATIVE
Glucose, UA: NEGATIVE mg/dL
Hgb urine dipstick: NEGATIVE
Ketones, ur: NEGATIVE mg/dL
Leukocytes,Ua: NEGATIVE
Nitrite: NEGATIVE
Protein, ur: NEGATIVE mg/dL
Specific Gravity, Urine: 1.024 (ref 1.005–1.030)
pH: 7 (ref 5.0–8.0)

## 2019-09-26 LAB — COMPREHENSIVE METABOLIC PANEL
ALT: 17 U/L (ref 0–44)
AST: 17 U/L (ref 15–41)
Albumin: 4.5 g/dL (ref 3.5–5.0)
Alkaline Phosphatase: 55 U/L (ref 38–126)
Anion gap: 11 (ref 5–15)
BUN: 18 mg/dL (ref 6–20)
CO2: 23 mmol/L (ref 22–32)
Calcium: 9 mg/dL (ref 8.9–10.3)
Chloride: 103 mmol/L (ref 98–111)
Creatinine, Ser: 0.97 mg/dL (ref 0.44–1.00)
GFR calc Af Amer: >60 mL/min (ref >60)
GFR calc non Af Amer: >60 mL/min (ref >60)
Glucose, Bld: 96 mg/dL (ref 70–99)
Potassium: 3.5 mmol/L (ref 3.5–5.1)
Sodium: 137 mmol/L (ref 135–145)
Total Bilirubin: 0.7 mg/dL (ref 0.3–1.2)
Total Protein: 7.7 g/dL (ref 6.5–8.1)

## 2019-09-26 LAB — POCT PREGNANCY, URINE: Preg Test, Ur: NEGATIVE

## 2019-09-26 LAB — LIPASE, BLOOD: Lipase: 23 U/L (ref 11–51)

## 2019-09-26 NOTE — ED Notes (Signed)
No answer when called several times from lobby 

## 2020-03-02 HISTORY — PX: CHOLECYSTECTOMY: SHX55

## 2020-11-04 IMAGING — US US MFM FETAL BPP W/O NON-STRESS
1 series · 15 of 28 positions shown · non-contrast
Comparison: none

[Series 1: us mfm fetal bpp w/o non-stress · 33 acquisitions, 15 frames shown]
[im 1/33]
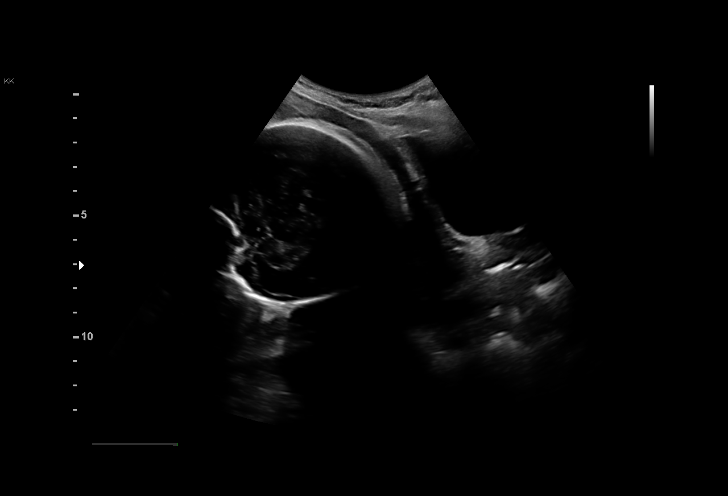
[im 3/33]
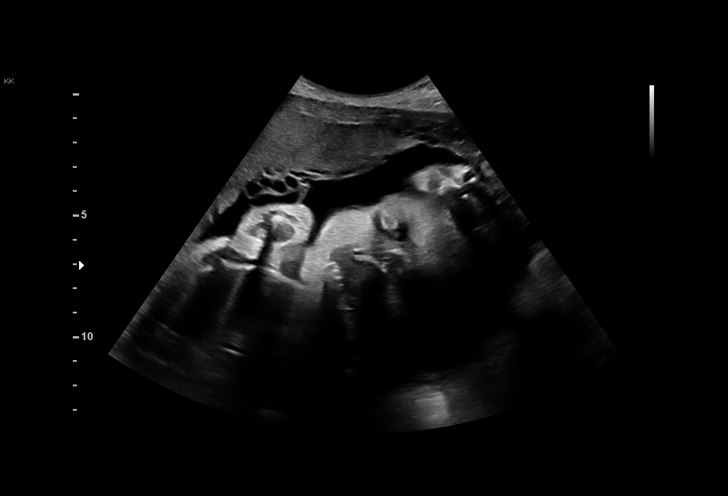
[im 5/33]
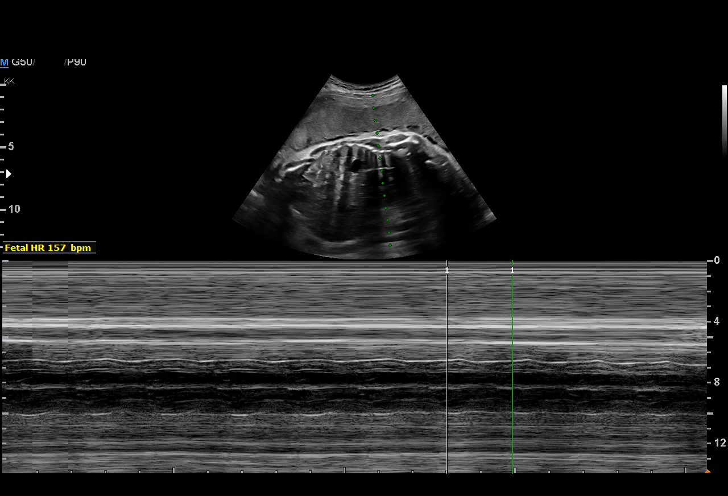
[im 8/33]
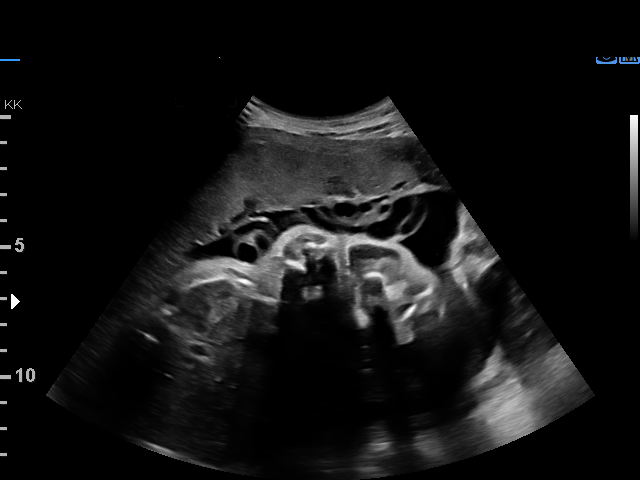
[im 10/33]
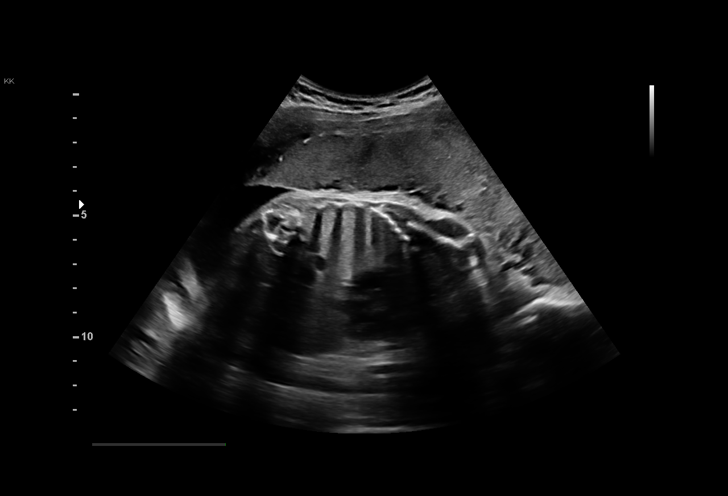
[im 12/33]
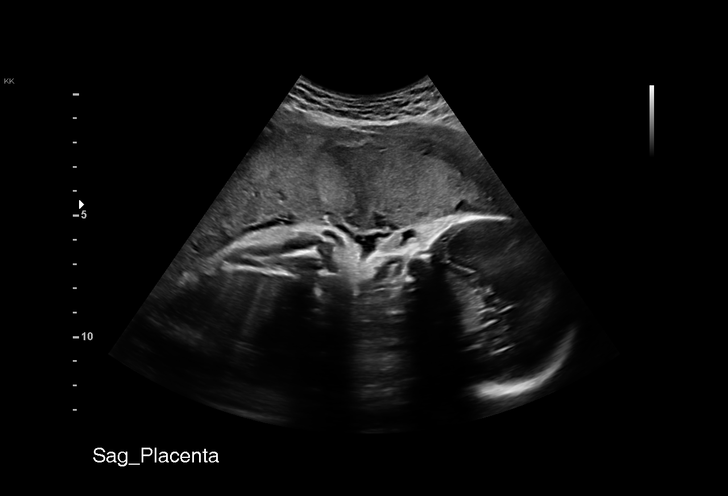
[im 15/33]
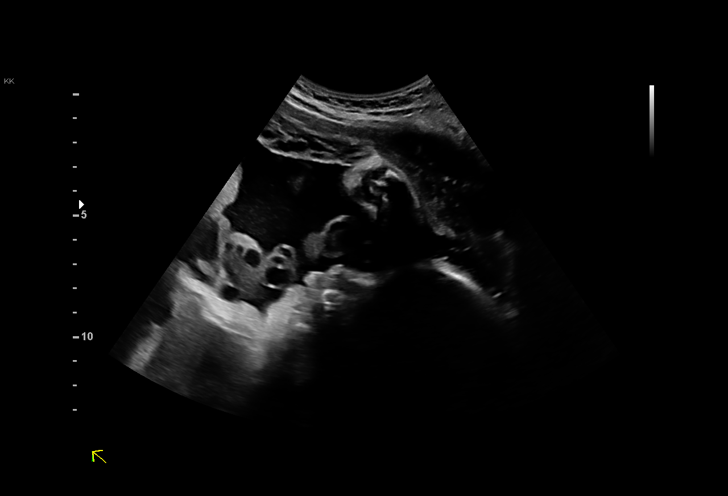
[im 17/33]
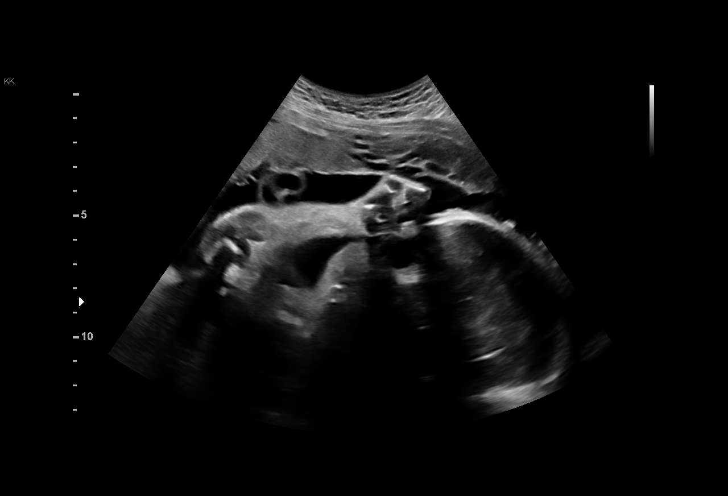
[im 18/33]
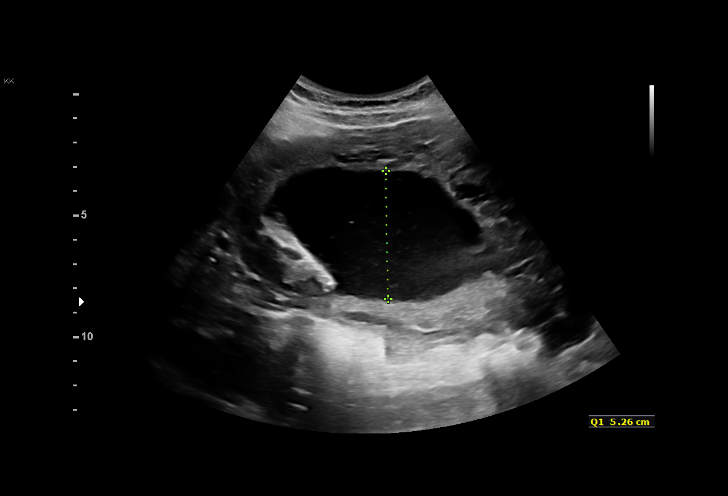
[im 21/33]
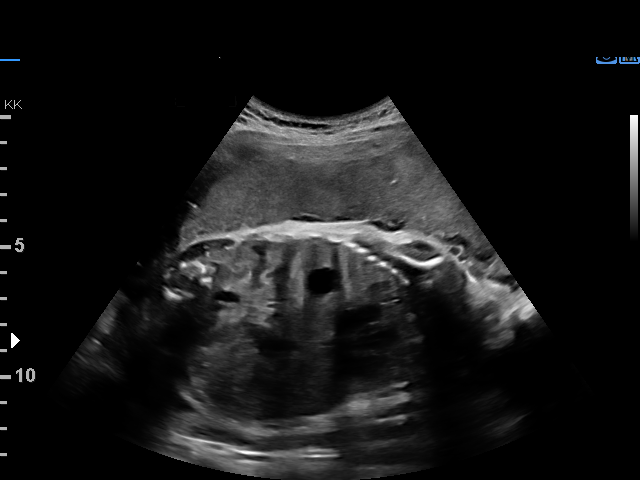
[im 23/33]
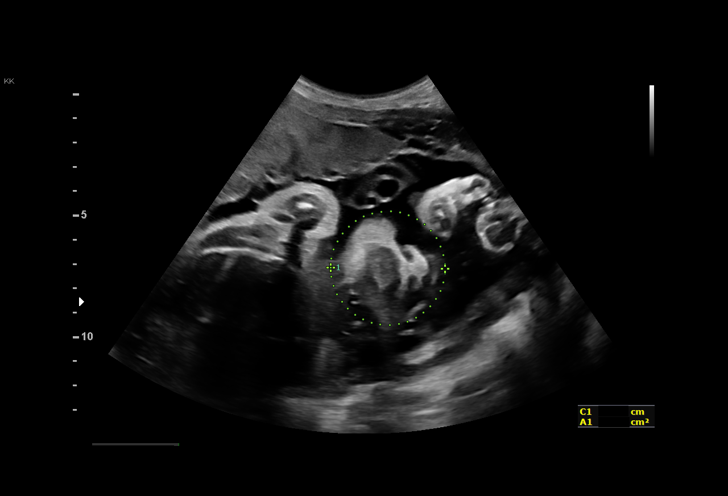
[im 25/33]
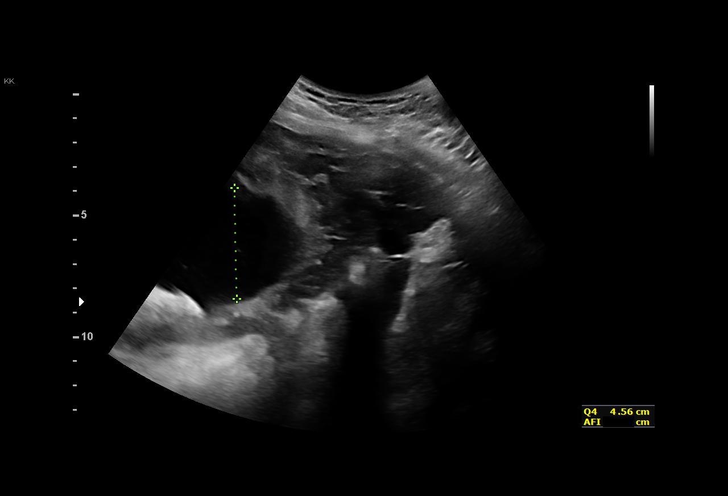
[im 28/33]
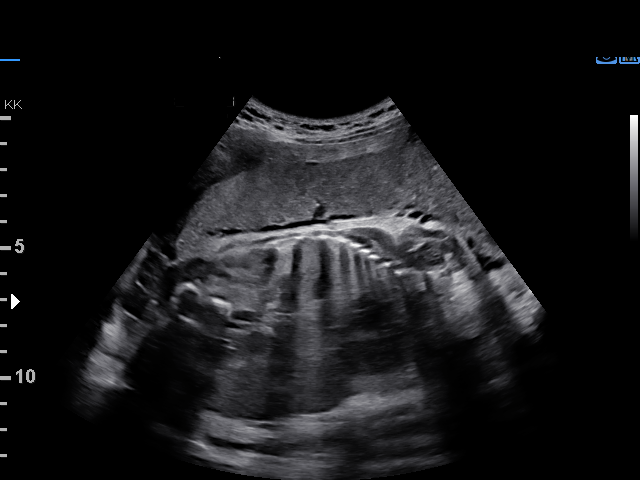
[im 30/33]
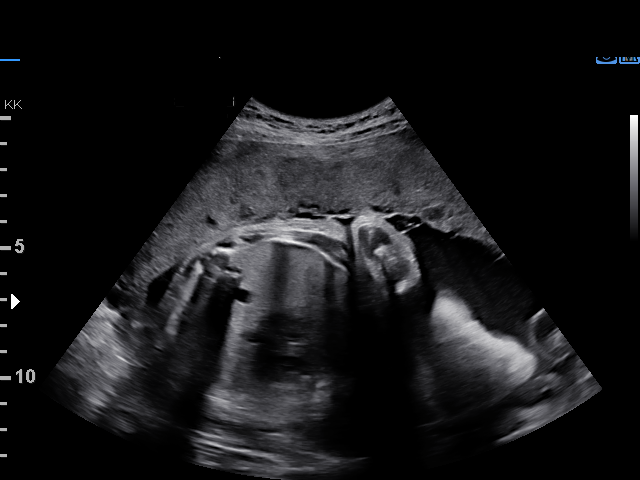
[im 33/33]
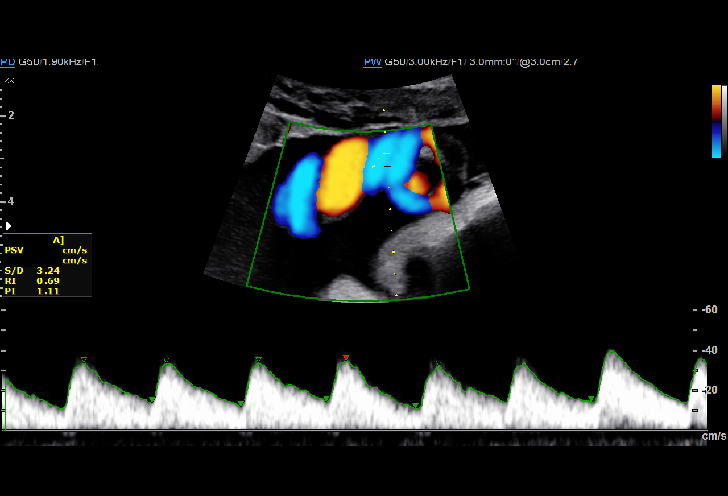

[15 of 28 positions shown; findings below may reference images not displayed]

[facility]]
                                                            [address]

     STRESS                                            [place]
  2  US MFM UA CORD DOPPLER               [address].02     [facility]
                                                       [place]
 ----------------------------------------------------------------------

 ----------------------------------------------------------------------
Indications

  Anemia during pregnancy in third trimester
  32 weeks gestation of pregnancy
  Short interval between pregancies, 3rd
  trimester
  Seizure disorder
  Poor obstetric history: Previous gestational
  diabetes
  Maternal care for known or suspected poor
  fetal growth, third trimester, not applicable or
  unspecified
  Negative QUAD
 ----------------------------------------------------------------------
Vital Signs

 BMI:
Fetal Evaluation

 Num Of Fetuses:         1
 Fetal Heart Rate(bpm):  157
 Cardiac Activity:       Observed
 Presentation:           Cephalic
 Placenta:               Anterior
 P. Cord Insertion:      Previously Visualized
 Amniotic Fluid
 AFI FV:      Within normal limits

 AFI Sum(cm)     %Tile       Largest Pocket(cm)
 15.88           57

 RUQ(cm)       RLQ(cm)       LUQ(cm)        LLQ(cm)

Biophysical Evaluation

 Amniotic F.V:   Pocket => 2 cm two         F. Tone:        Observed
                 planes
 F. Movement:    Observed                   Score:          [DATE]
 F. Breathing:   Observed
OB History

 Gravidity:    4         Term:   3
 Living:       3
Gestational Age

 Best:          32w 0d     Det. By:  Previous Ultrasound      EDD:   12/28/18
                                     (06/27/18)
Doppler - Fetal Vessels

 Umbilical Artery
  S/D     %tile     RI                                     ADFV    RDFV
  3.2       78   0.69                                         No      No

Impression

 Known IUGR but has improved over the last two vists Now
 EFW 10h% with AC at the 28th% on [DATE] prior EFW 2% with
 AC <6th%
 Normal BPP
 UA Dopplers
Recommendations

 Follow up growth in 3 weeks, if EFW >10th with AC>10th
 Discontinue serial growth and testing.
 Continue weekly testing

## 2021-04-10 ENCOUNTER — Other Ambulatory Visit (HOSPITAL_COMMUNITY): Payer: Self-pay | Admitting: Nurse Practitioner

## 2021-04-10 ENCOUNTER — Other Ambulatory Visit: Payer: Self-pay | Admitting: Nurse Practitioner

## 2021-04-10 DIAGNOSIS — R1013 Epigastric pain: Secondary | ICD-10-CM

## 2021-04-15 ENCOUNTER — Other Ambulatory Visit: Payer: Self-pay

## 2021-04-15 ENCOUNTER — Ambulatory Visit
Admission: RE | Admit: 2021-04-15 | Discharge: 2021-04-15 | Disposition: A | Discharge status: Home / Self Care | Payer: Medicaid Other | Source: Ambulatory Visit | Attending: Nurse Practitioner | Admitting: Nurse Practitioner

## 2021-04-15 DIAGNOSIS — R1013 Epigastric pain: Secondary | ICD-10-CM | POA: Insufficient documentation

## 2021-05-08 ENCOUNTER — Other Ambulatory Visit: Payer: Self-pay

## 2021-05-08 ENCOUNTER — Encounter: Payer: Self-pay | Admitting: Surgery

## 2021-05-08 ENCOUNTER — Ambulatory Visit: Payer: Self-pay | Admitting: Surgery

## 2021-05-08 ENCOUNTER — Ambulatory Visit: Payer: Medicaid Other | Admitting: Surgery

## 2021-05-08 ENCOUNTER — Telehealth: Payer: Self-pay | Admitting: Surgery

## 2021-05-08 VITALS — BP 118/74 | HR 72 | Temp 99.1°F | Ht 61.0 in | Wt 121.6 lb

## 2021-05-08 DIAGNOSIS — K8010 Calculus of gallbladder with chronic cholecystitis without obstruction: Secondary | ICD-10-CM

## 2021-05-08 NOTE — Telephone Encounter (Signed)
Patient has been advised of Pre-Admission date/time, COVID Testing date and Surgery date. ? ?Surgery Date: 05/14/21 ?Preadmission Testing Date: 05/12/21 (phone 8a-1p) ?Covid Testing Date: Not needed. ? ?Patient has been made aware to call [phone], between 1-3:00pm the day before surgery, to find out what time to arrive for surgery.   ? ?

## 2021-05-08 NOTE — Patient Instructions (Signed)
Our surgery scheduler Britta MccreedyBarbara will call you within 24-48 hours to get you scheduled. If you have not heard from her after 48 hours, please call our office. You will not need to get Covid tested before surgery and have the blue sheet available when she calls to write down important information.  If you have any concerns or questions, please feel free to call our office.   Minimally Invasive Cholecystectomy Minimally invasive cholecystectomy is surgery to remove the gallbladder. The gallbladder is a pear-shaped organ that lies beneath the liver on the right side of the body. The gallbladder stores bile, which is a fluid that helps the body digest fats. Cholecystectomy is often done to treat inflammation (irritation and swelling) of the gallbladder (cholecystitis). This condition is usually caused by a buildup of gallstones (cholelithiasis) in the gallbladder or when the fluid in the gall bladder becomes stagnant because gallstones get stuck in the ducts (tubes) and block the flow of bile. This can result in inflammation and pain. In severe cases, emergency surgery may be required. This procedure is done through small incisions in the abdomen, instead of one large incision. It is also called laparoscopic surgery. A thin scope with a camera (laparoscope) is inserted through one incision. Then surgical instruments are inserted through the other incisions. In some cases, a minimally invasive surgery may need to be changed to a surgery that is done through a larger incision. This is called open surgery. Tell a health care provider about: Any allergies you have. All medicines you are taking, including vitamins, herbs, eye drops, creams, and over-the-counter medicines. Any problems you or family members have had with anesthetic medicines. Any bleeding problems you have. Any surgeries you have had. Any medical conditions you have. Whether you are pregnant or may be pregnant. What are the risks? Generally, this  is a safe procedure. However, problems may occur, including: Infection. Bleeding. Allergic reactions to medicines. Damage to nearby structures or organs. A gallstone remaining in the common bile duct. The common bile duct carries bile from the gallbladder to the small intestine. A bile leak from the liver or cystic duct after your gallbladder is removed. What happens before the procedure? When to stop eating and drinking Follow instructions from your health care provider about what you may eat and drink before your procedure. These may include: 8 hours before the procedure Stop eating most foods. Do not eat meat, fried foods, or fatty foods. Eat only light foods, such as toast or crackers. All liquids are okay except energy drinks and alcohol. 6 hours before the procedure Stop eating. Drink only clear liquids, such as water, clear fruit juice, black coffee, plain tea, and sports drinks. Do not drink energy drinks or alcohol. 2 hours before the procedure Stop drinking all liquids. You may be allowed to take medicines with small sips of water. If you do not follow your health care provider's instructions, your procedure may be delayed or canceled. Medicines Ask your health care provider about: Changing or stopping your regular medicines. This is especially important if you are taking diabetes medicines or blood thinners. Taking medicines such as aspirin and ibuprofen. These medicines can thin your blood. Do not take these medicines unless your health care provider tells you to take them. Taking over-the-counter medicines, vitamins, herbs, and supplements. General instructions If you will be going home right after the procedure, plan to have a responsible adult: Take you home from the hospital or clinic. You will not be allowed to drive.  Care for you for the time you are told. Do not use any products that contain nicotine or tobacco for at least 4 weeks before the procedure. These  products include cigarettes, chewing tobacco, and vaping devices, such as e-cigarettes. If you need help quitting, ask your health care provider. Ask your health care provider: How your surgery site will be marked. What steps will be taken to help prevent infection. These may include: Removing hair at the surgery site. Washing skin with a germ-killing soap. Taking antibiotic medicine. What happens during the procedure?  An IV will be inserted into one of your veins. You will be given one or both of the following: A medicine to help you relax (sedative). A medicine to make you fall asleep (general anesthetic). Your surgeon will make several small incisions in your abdomen. The laparoscope will be inserted through one of the small incisions. The camera on the laparoscope will send images to a monitor in the operating room. This lets your surgeon see inside your abdomen. A gas will be pumped into your abdomen. This will expand your abdomen to give the surgeon more room to perform the surgery. Other tools that are needed for the procedure will be inserted through the other incisions. The gallbladder will be removed through one of the incisions. Your common bile duct may be examined. If stones are found in the common bile duct, they may be removed. After your gallbladder has been removed, the incisions will be closed with stitches (sutures), staples, or skin glue. Your incisions will be covered with a bandage (dressing). The procedure may vary among health care providers and hospitals. What happens after the procedure? Your blood pressure, heart rate, breathing rate, and blood oxygen level will be monitored until you leave the hospital or clinic. You will be given medicines as needed to control your pain. You may have a drain placed in the incision. The drain will be removed a day or two after the procedure. Summary Minimally invasive cholecystectomy, also called laparoscopic cholecystectomy, is  surgery to remove the gallbladder using small incisions. Tell your health care provider about all the medical conditions you have and all the medicines you are taking for those conditions. Before the procedure, follow instructions about when to stop eating and drinking and changing or stopping medicines. Plan to have a responsible adult care for you for the time you are told after you leave the hospital or clinic. This information is not intended to replace advice given to you by your health care provider. Make sure you discuss any questions you have with your health care provider.  Gallbladder Eating Plan If you have a gallbladder condition, you may have trouble digesting fats. Eating a low-fat diet can help reduce your symptoms, and may be helpful before and after having surgery to remove your gallbladder (cholecystectomy). Your health care provider may recommend that you work with a diet and nutrition specialist (dietitian) to help you reduce the amount of fat in your diet. What are tips for following this plan? General guidelines Limit your fat intake to less than 30% of your total daily calories. If you eat around 1,800 calories each day, this is less than 60 grams (g) of fat per day. Fat is an important part of a healthy diet. Eating a low-fat diet can make it hard to maintain a healthy body weight. Ask your dietitian how much fat, calories, and other nutrients you need each day. Eat small, frequent meals throughout the day instead of three  large meals. Drink at least 8-10 cups of fluid a day. Drink enough fluid to keep your urine clear or pale yellow. Limit alcohol intake to no more than 1 drink a day for nonpregnant women and 2 drinks a day for men. One drink equals 12 oz of beer, 5 oz of wine, or 1 oz of hard liquor. Reading food labels  Check Nutrition Facts on food labels for the amount of fat per serving. Choose foods with less than 3 grams of fat per serving. Shopping Choose nonfat  and low-fat healthy foods. Look for the words "nonfat," "low fat," or "fat free." Avoid buying processed or prepackaged foods. Cooking Cook using low-fat methods, such as baking, broiling, grilling, or boiling. Cook with small amounts of healthy fats, such as olive oil, grapeseed oil, canola oil, or sunflower oil. What foods are recommended? All fresh, frozen, or canned fruits and vegetables. Whole grains. Low-fat or non-fat (skim) milk and yogurt. Lean meat, skinless poultry, fish, eggs, and beans. Low-fat protein supplement powders or drinks. Spices and herbs. What foods are not recommended? High-fat foods. These include baked goods, fast food, fatty cuts of meat, ice cream, french toast, sweet rolls, pizza, cheese bread, foods covered with butter, creamy sauces, or cheese. Fried foods. These include french fries, tempura, battered fish, breaded chicken, fried breads, and sweets. Foods with strong odors. Foods that cause bloating and gas. Summary A low-fat diet can be helpful if you have a gallbladder condition, or before and after gallbladder surgery. Limit your fat intake to less than 30% of your total daily calories. This is about 60 g of fat if you eat 1,800 calories each day. Eat small, frequent meals throughout the day instead of three large meals. This information is not intended to replace advice given to you by your health care provider. Make sure you discuss any questions you have with your health care provider. Document Revised: 09/29/2019 Document Reviewed: 10/05/2019 Elsevier Patient Education  2022 [employer].

## 2021-05-08 NOTE — H&P (View-Only) (Signed)
Patient ID: Veronica Brooks, female   DOB: 1995-07-08, 26 y.o.   MRN: [id number] ? ?Chief Complaint: Right upper quadrant pain ? ?History of Present Illness ?Veronica Brooks is a 26 y.o. female with history of intermittent right upper quadrant pain with radiation to the back since 2021.  She reports multiple episodes of severe right upper quadrant pain, she could not wait 12 hours in the emergency room for 1 evaluation, turned away and ambulance on another episode.  Has associated nausea and vomiting.  She denies any history of hepatitis or jaundice.  She denies any fevers or chills.  Ultrasound obtained as an outpatient which confirmed the presence of innumerable gallstones, without any evidence of a positive Murphy sign, nor pericholecystic fluid or gallbladder wall thickening to warrant concerns of acute cholecystitis.  Common bile duct was noted to be 3 mm in diameter distally on that ultrasound obtained April 15, 2021. ? ?Past Medical History ?Past Medical History:  ?Diagnosis Date  ? Acetaminophen overdose, intentional self-harm, initial encounter (HCC) 03/08/2016  ? Bronchitis   ? Depression   ? Heart murmur   ? Does not require cardiologist  ?  ? ? ?Past Surgical History:  ?Procedure Laterality Date  ? NO PAST SURGERIES    ? TUBAL LIGATION N/A 01/03/2019  ? Procedure: POST PARTUM TUBAL LIGATION;  Surgeon: Levie Heritage, DO;  Location: [facility];  Service: Gynecology;  Laterality: N/A;  ? ? ?Allergies  ?Allergen Reactions  ? Latex Rash and Other (See Comments)  ?  Reaction:  Burning   ? ? ?Current Outpatient Medications  ?Medication Sig Dispense Refill  ? acetaminophen (TYLENOL) 325 MG tablet Take 650 mg by mouth every 6 (six) hours as needed for mild pain or headache.    ? ?No current facility-administered medications for this visit.  ? ? ?Family History ?Family History  ?Problem Relation Age of Onset  ? Diabetes Maternal Grandmother   ? Cancer Maternal Grandfather   ?  ? ? ?Social History ?Social History   ? ?Tobacco Use  ? Smoking status: Former  ?  Types: Cigarettes  ? Smokeless tobacco: Never  ?Vaping Use  ? Vaping Use: Never used  ?Substance Use Topics  ? Alcohol use: No  ? Drug use: No  ?  ?  ? ? ?Review of Systems  ?Constitutional:  Positive for weight loss.  ?HENT: Negative.    ?Eyes: Negative.   ?Respiratory: Negative.    ?Cardiovascular: Negative.   ?Gastrointestinal:  Positive for abdominal pain and nausea.  ?Genitourinary: Negative.   ?Skin: Negative.   ?Neurological:  Positive for headaches.  ?Psychiatric/Behavioral: Negative.    ?  ? ?Physical Exam ?Blood pressure 118/74, pulse 72, temperature 99.1 ?F (37.3 ?C), temperature source Oral, height 5\' 1"  (1.549 m), weight 121 lb 9.6 oz (55.2 kg), SpO2 97 %, unknown if currently breastfeeding. ?Last Weight  Most recent update: 05/08/2021  9:52 AM  ? ? Weight  ?55.2 kg (121 lb 9.6 oz)  ?      ? ?  ? ? ?CONSTITUTIONAL: Well developed, and nourished, appropriately responsive and aware without distress.   ?EYES: Sclera non-icteric.   ?EARS, NOSE, MOUTH AND THROAT: Mask worn.   The oropharynx is clear. Oral mucosa is pink and moist.   Hearing is intact to voice.  ?NECK: Trachea is midline, and there is no jugular venous distension.  ?LYMPH NODES:  Lymph nodes in the neck are not enlarged. ?RESPIRATORY:  Lungs are clear, and breath sounds are equal  bilaterally. Normal respiratory effort without pathologic use of accessory muscles. ?CARDIOVASCULAR: Heart is regular in rate and rhythm. ?GI: The abdomen is soft, nontender, and nondistended. There were no palpable masses. I did not appreciate hepatosplenomegaly.  ?MUSCULOSKELETAL:  Symmetrical muscle tone appreciated in all four extremities.    ?SKIN: Skin turgor is normal. No pathologic skin lesions appreciated.  ?NEUROLOGIC:  Motor and sensation appear grossly normal.  Cranial nerves are grossly without defect. ?PSYCH:  Alert and oriented to person, place and time. Affect is appropriate for situation. ? ?Data  Reviewed ?I have personally reviewed what is currently available of the patient's imaging, recent labs and medical records.   ?Labs:  ?CBC Latest Ref Rng & Units 09/25/2019 01/02/2019 01/01/2019  ?WBC 4.0 - 10.5 K/uL 12.0(H) 20.0(H) 21.9(H)  ?Hemoglobin 12.0 - 15.0 g/dL 70.2 6.3(Z) 10.7(L)  ?Hematocrit 36.0 - 46.0 % 36.7 28.7(L) 32.7(L)  ?Platelets 150 - 400 K/uL 308 215 208  ? ?CMP Latest Ref Rng & Units 09/25/2019 09/12/2018 10/20/2017  ?Glucose 70 - 99 mg/dL 96 90 858(I)  ?BUN 6 - 20 mg/dL 18 7 9   ?Creatinine 0.44 - 1.00 mg/dL 5.02 7.74  ?Sodium 135 - 145 mmol/L 137 134(L) 136  ?Potassium 3.5 - 5.1 mmol/L 3.5 3.2(L) 3.6  ?Chloride 98 - 111 mmol/L 103 101 106  ?CO2 22 - 32 mmol/L 23 24 22   ?Calcium 8.9 - 10.3 mg/dL 9.0 1.28) )  ?Total Protein 6.5 - 8.1 g/dL 7.7 - 7.4  ?Total Bilirubin 0.3 - 1.2 mg/dL 0.7 - 7.8(M)  ?Alkaline Phos 38 - 126 U/L 55 - 50  ?AST 15 - 41 U/L 17 - 13(L)  ?ALT 0 - 44 U/L 17 - 9  ? ? ? ? ?Imaging: ?Radiology review:   ?CLINICAL DATA:  Epigastric abdominal pain ?  ?EXAM: ?ULTRASOUND ABDOMEN LIMITED RIGHT UPPER QUADRANT ?  ?COMPARISON:  None. ?  ?FINDINGS: ?Gallbladder: ?  ?Innumerable gallstones are seen within a contracted gallbladder ?resulting in an "wall echo shadow complex" compatible with changes ?of chronic cholecystitis in the appropriate clinical setting. No ?pericholecystic fluid or gallbladder wall thickening is identified. ?The sonographic 7.6(H sign is reportedly negative. ?  ?Common bile duct: ?  ?Diameter: 3 mm in diameter distally ?  ?Liver: ?  ?No focal lesion identified. Within normal limits in parenchymal ?echogenicity. Portal vein is patent on color Doppler imaging with ?normal direction of blood flow towards the liver. ?  ?Other: None. ?  ?IMPRESSION: ?Stone filled, contracted gallbladder in keeping with changes of ?chronic cholecystitis in the appropriate clinical setting. ?  ?  ?Electronically Signed ?  By: 2.0(N M.D. ?  On: 04/15/2021 16:15 ? ?Within  last 24 hrs: No results found. ? ?Assessment ?   ? ?Patient Active Problem List  ? Diagnosis Date Noted  ? 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 S/P tubal ligation 01/04/2019  ? [redacted] weeks gestation of pregnancy 01/04/2019  ? Normal labor 01/01/2019  ? NSVD (normal spontaneous vaginal delivery) 01/01/2019  ? Severe recurrent major depression without psychotic features (HCC) 03/10/2016  ? Depression 03/08/2016  ? Suicidal behavior 03/08/2016  ? Seizure disorder (HCC) 12/19/2014  ? Pregnancy 03/23/2014  ? UTI (urinary tract infection) during pregnancy 03/21/2014  ? ? ?Plan ?   ?Robotic cholecystectomy with ICG imaging ?This was discussed thoroughly.  Optimal plan is for robotic cholecystectomy.  Risks and benefits have been discussed with the patient which include but are not limited to anesthesia, bleeding, infection, biliary ductal injury or  stenosis, other associated unanticipated injuries affiliated with laparoscopic surgery.  I believe there is the desire to proceed, interpreter utilized as needed.  Questions elicited and answered to satisfaction.  No guarantees ever expressed or implied. ? ? ?Face-to-face time spent with the patient and accompanying care providers(if present) was 30 minutes, with more than 50% of the time spent counseling, educating, and coordinating care of the patient.   ? ?These notes generated with voice recognition software. I apologize for typographical errors. ? ?Mujahid Jalomo M.D., FACS ?05/08/2021, 10:39 AM ? ? ? ? ?

## 2021-05-08 NOTE — Telephone Encounter (Signed)
Patient notified of surgery information. 

## 2021-05-08 NOTE — Progress Notes (Signed)
Patient ID: Veronica Brooks, female   DOB: 1995-07-08, 26 y.o.   MRN: [id number] ? ?Chief Complaint: Right upper quadrant pain ? ?History of Present Illness ?Veronica Brooks is a 26 y.o. female with history of intermittent right upper quadrant pain with radiation to the back since 2021.  She reports multiple episodes of severe right upper quadrant pain, she could not wait 12 hours in the emergency room for 1 evaluation, turned away and ambulance on another episode.  Has associated nausea and vomiting.  She denies any history of hepatitis or jaundice.  She denies any fevers or chills.  Ultrasound obtained as an outpatient which confirmed the presence of innumerable gallstones, without any evidence of a positive Murphy sign, nor pericholecystic fluid or gallbladder wall thickening to warrant concerns of acute cholecystitis.  Common bile duct was noted to be 3 mm in diameter distally on that ultrasound obtained April 15, 2021. ? ?Past Medical History ?Past Medical History:  ?Diagnosis Date  ? Acetaminophen overdose, intentional self-harm, initial encounter (HCC) 03/08/2016  ? Bronchitis   ? Depression   ? Heart murmur   ? Does not require cardiologist  ?  ? ? ?Past Surgical History:  ?Procedure Laterality Date  ? NO PAST SURGERIES    ? TUBAL LIGATION N/A 01/03/2019  ? Procedure: POST PARTUM TUBAL LIGATION;  Surgeon: Levie Heritage, DO;  Location: [facility];  Service: Gynecology;  Laterality: N/A;  ? ? ?Allergies  ?Allergen Reactions  ? Latex Rash and Other (See Comments)  ?  Reaction:  Burning   ? ? ?Current Outpatient Medications  ?Medication Sig Dispense Refill  ? acetaminophen (TYLENOL) 325 MG tablet Take 650 mg by mouth every 6 (six) hours as needed for mild pain or headache.    ? ?No current facility-administered medications for this visit.  ? ? ?Family History ?Family History  ?Problem Relation Age of Onset  ? Diabetes Maternal Grandmother   ? Cancer Maternal Grandfather   ?  ? ? ?Social History ?Social History   ? ?Tobacco Use  ? Smoking status: Former  ?  Types: Cigarettes  ? Smokeless tobacco: Never  ?Vaping Use  ? Vaping Use: Never used  ?Substance Use Topics  ? Alcohol use: No  ? Drug use: No  ?  ?  ? ? ?Review of Systems  ?Constitutional:  Positive for weight loss.  ?HENT: Negative.    ?Eyes: Negative.   ?Respiratory: Negative.    ?Cardiovascular: Negative.   ?Gastrointestinal:  Positive for abdominal pain and nausea.  ?Genitourinary: Negative.   ?Skin: Negative.   ?Neurological:  Positive for headaches.  ?Psychiatric/Behavioral: Negative.    ?  ? ?Physical Exam ?Blood pressure 118/74, pulse 72, temperature 99.1 ?F (37.3 ?C), temperature source Oral, height 5\' 1"  (1.549 m), weight 121 lb 9.6 oz (55.2 kg), SpO2 97 %, unknown if currently breastfeeding. ?Last Weight  Most recent update: 05/08/2021  9:52 AM  ? ? Weight  ?55.2 kg (121 lb 9.6 oz)  ?      ? ?  ? ? ?CONSTITUTIONAL: Well developed, and nourished, appropriately responsive and aware without distress.   ?EYES: Sclera non-icteric.   ?EARS, NOSE, MOUTH AND THROAT: Mask worn.   The oropharynx is clear. Oral mucosa is pink and moist.   Hearing is intact to voice.  ?NECK: Trachea is midline, and there is no jugular venous distension.  ?LYMPH NODES:  Lymph nodes in the neck are not enlarged. ?RESPIRATORY:  Lungs are clear, and breath sounds are equal  bilaterally. Normal respiratory effort without pathologic use of accessory muscles. ?CARDIOVASCULAR: Heart is regular in rate and rhythm. ?GI: The abdomen is soft, nontender, and nondistended. There were no palpable masses. I did not appreciate hepatosplenomegaly.  ?MUSCULOSKELETAL:  Symmetrical muscle tone appreciated in all four extremities.    ?SKIN: Skin turgor is normal. No pathologic skin lesions appreciated.  ?NEUROLOGIC:  Motor and sensation appear grossly normal.  Cranial nerves are grossly without defect. ?PSYCH:  Alert and oriented to person, place and time. Affect is appropriate for situation. ? ?Data  Reviewed ?I have personally reviewed what is currently available of the patient's imaging, recent labs and medical records.   ?Labs:  ?CBC Latest Ref Rng & Units 09/25/2019 01/02/2019 01/01/2019  ?WBC 4.0 - 10.5 K/uL 12.0(H) 20.0(H) 21.9(H)  ?Hemoglobin 12.0 - 15.0 g/dL 70.2 6.3(Z) 10.7(L)  ?Hematocrit 36.0 - 46.0 % 36.7 28.7(L) 32.7(L)  ?Platelets 150 - 400 K/uL 308 215 208  ? ?CMP Latest Ref Rng & Units 09/25/2019 09/12/2018 10/20/2017  ?Glucose 70 - 99 mg/dL 96 90 858(I)  ?BUN 6 - 20 mg/dL 18 7 9   ?Creatinine 0.44 - 1.00 mg/dL 5.02 7.74  ?Sodium 135 - 145 mmol/L 137 134(L) 136  ?Potassium 3.5 - 5.1 mmol/L 3.5 3.2(L) 3.6  ?Chloride 98 - 111 mmol/L 103 101 106  ?CO2 22 - 32 mmol/L 23 24 22   ?Calcium 8.9 - 10.3 mg/dL 9.0 1.28) )  ?Total Protein 6.5 - 8.1 g/dL 7.7 - 7.4  ?Total Bilirubin 0.3 - 1.2 mg/dL 0.7 - 7.8(M)  ?Alkaline Phos 38 - 126 U/L 55 - 50  ?AST 15 - 41 U/L 17 - 13(L)  ?ALT 0 - 44 U/L 17 - 9  ? ? ? ? ?Imaging: ?Radiology review:   ?CLINICAL DATA:  Epigastric abdominal pain ?  ?EXAM: ?ULTRASOUND ABDOMEN LIMITED RIGHT UPPER QUADRANT ?  ?COMPARISON:  None. ?  ?FINDINGS: ?Gallbladder: ?  ?Innumerable gallstones are seen within a contracted gallbladder ?resulting in an "wall echo shadow complex" compatible with changes ?of chronic cholecystitis in the appropriate clinical setting. No ?pericholecystic fluid or gallbladder wall thickening is identified. ?The sonographic 7.6(H sign is reportedly negative. ?  ?Common bile duct: ?  ?Diameter: 3 mm in diameter distally ?  ?Liver: ?  ?No focal lesion identified. Within normal limits in parenchymal ?echogenicity. Portal vein is patent on color Doppler imaging with ?normal direction of blood flow towards the liver. ?  ?Other: None. ?  ?IMPRESSION: ?Stone filled, contracted gallbladder in keeping with changes of ?chronic cholecystitis in the appropriate clinical setting. ?  ?  ?Electronically Signed ?  By: 2.0(N M.D. ?  On: 04/15/2021 16:15 ? ?Within  last 24 hrs: No results found. ? ?Assessment ?   ? ?Patient Active Problem List  ? Diagnosis Date Noted  ? 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 S/P tubal ligation 01/04/2019  ? [redacted] weeks gestation of pregnancy 01/04/2019  ? Normal labor 01/01/2019  ? NSVD (normal spontaneous vaginal delivery) 01/01/2019  ? Severe recurrent major depression without psychotic features (HCC) 03/10/2016  ? Depression 03/08/2016  ? Suicidal behavior 03/08/2016  ? Seizure disorder (HCC) 12/19/2014  ? Pregnancy 03/23/2014  ? UTI (urinary tract infection) during pregnancy 03/21/2014  ? ? ?Plan ?   ?Robotic cholecystectomy with ICG imaging ?This was discussed thoroughly.  Optimal plan is for robotic cholecystectomy.  Risks and benefits have been discussed with the patient which include but are not limited to anesthesia, bleeding, infection, biliary ductal injury or  stenosis, other associated unanticipated injuries affiliated with laparoscopic surgery.  I believe there is the desire to proceed, interpreter utilized as needed.  Questions elicited and answered to satisfaction.  No guarantees ever expressed or implied. ? ? ?Face-to-face time spent with the patient and accompanying care providers(if present) was 30 minutes, with more than 50% of the time spent counseling, educating, and coordinating care of the patient.   ? ?These notes generated with voice recognition software. I apologize for typographical errors. ? ?Campbell Lernerenny Shelle Galdamez M.D., FACS ?05/08/2021, 10:39 AM ? ? ? ? ?

## 2021-05-12 ENCOUNTER — Other Ambulatory Visit: Payer: Self-pay

## 2021-05-12 ENCOUNTER — Encounter
Admission: RE | Admit: 2021-05-12 | Discharge: 2021-05-12 | Disposition: A | Discharge status: Home / Self Care | Payer: Medicaid Other | Source: Ambulatory Visit | Attending: Surgery | Admitting: Surgery

## 2021-05-12 NOTE — Patient Instructions (Signed)
Your procedure is scheduled on: 05/14/21 Report to DAY SURGERY DEPARTMENT LOCATED ON 2ND FLOOR MEDICAL MALL ENTRANCE. To find out your arrival time please call [phone] between 1PM - 3PM on 05/13/21.  Remember: Instructions that are not followed completely may result in serious medical risk, up to and including death, or upon the discretion of your surgeon and anesthesiologist your surgery may need to be rescheduled.     _X__ 1. Do not eat food or drink liquids after midnight the night before your procedure.                 No gum chewing or hard candies.   __X__2.  On the morning of surgery brush your teeth with toothpaste and water, you                 may rinse your mouth with mouthwash if you wish.  Do not swallow any              toothpaste of mouthwash.     _X__ 3.  No Alcohol for 24 hours before or after surgery.   _X__ 4.  Do Not Smoke or use e-cigarettes For 24 Hours Prior to Your Surgery.                 Do not use any chewable tobacco products for at least 6 hours prior to                 surgery.  ____  5.  Bring all medications with you on the day of surgery if instructed.   __X__  6.  Notify your doctor if there is any change in your medical condition      (cold, fever, infections).     Do not wear jewelry, make-up, hairpins, clips or nail polish. Do not wear lotions, powders, or perfumes.  Do not shave body hair 48 hours prior to surgery. Men may shave face and neck. Do not bring valuables to the hospital.    [facility] is not responsible for any belongings or valuables.  Contacts, dentures/partials or body piercings may not be worn into surgery. Bring a case for your contacts, glasses or hearing aids, a denture cup will be supplied. Leave your suitcase in the car. After surgery it may be brought to your room. For patients admitted to the hospital, discharge time is determined by your treatment team.   Patients discharged the day of surgery will not be allowed to  drive home.   Please read over the following fact sheets that you were given:     __X__ Take these medicines the morning of surgery with A SIP OF WATER:    1. none  2.   3.   4.  5.  6.  ____ Fleet Enema (as directed)   __X__ Use CHG Soap/SAGE wipes as directed  ____ Use inhalers on the day of surgery  ____ Stop metformin/Janumet/Farxiga 2 days prior to surgery    ____ Take 1/2 of usual insulin dose the night before surgery. No insulin the morning          of surgery.   ____ Stop Blood Thinners Coumadin/Plavix/Xarelto/Pleta/Pradaxa/Eliquis/Effient/Aspirin  on   Or contact your Surgeon, Cardiologist or Medical Doctor regarding  ability to stop your blood thinners  __X__ Stop Anti-inflammatories 7 days before surgery such as Advil, Ibuprofen, Motrin,  BC or Goodies Powder, Naprosyn, Naproxen, Aleve, Aspirin    __X__ Stop all herbals and supplements, fish oil or vitamins  until after surgery.    ____ Bring C-Pap to the hospital.

## 2021-05-13 MED ORDER — CEFAZOLIN SODIUM-DEXTROSE 2-4 GM/100ML-% IV SOLN
2.0000 g | INTRAVENOUS | Status: AC
  Administered 2021-05-14: 2 g via INTRAVENOUS

## 2021-05-13 MED ORDER — ORAL CARE MOUTH RINSE: 15.0000 mL | Freq: Once | OROMUCOSAL | Status: AC

## 2021-05-13 MED ORDER — CHLORHEXIDINE GLUCONATE CLOTH 2 % EX PADS
6.0000 | MEDICATED_PAD | Freq: Once | CUTANEOUS | Status: AC
  Administered 2021-05-14: 6 via TOPICAL

## 2021-05-13 MED ORDER — CELECOXIB 200 MG PO CAPS
200.0000 mg | ORAL_CAPSULE | ORAL | Status: AC
  Administered 2021-05-14: 200 mg via ORAL

## 2021-05-13 MED ORDER — BUPIVACAINE LIPOSOME 1.3 % IJ SUSP: 20.0000 mL | Freq: Once | INTRAMUSCULAR | Status: DC

## 2021-05-13 MED ORDER — GABAPENTIN 300 MG PO CAPS
300.0000 mg | ORAL_CAPSULE | ORAL | Status: AC
  Administered 2021-05-14: 300 mg via ORAL

## 2021-05-13 MED ORDER — CHLORHEXIDINE GLUCONATE 0.12 % MT SOLN
15.0000 mL | Freq: Once | OROMUCOSAL | Status: AC
  Administered 2021-05-14: 15 mL via OROMUCOSAL

## 2021-05-13 MED ORDER — FAMOTIDINE 20 MG PO TABS
20.0000 mg | ORAL_TABLET | Freq: Once | ORAL | Status: AC
  Administered 2021-05-14: 20 mg via ORAL

## 2021-05-13 MED ORDER — ACETAMINOPHEN 500 MG PO TABS
1000.0000 mg | ORAL_TABLET | ORAL | Status: AC
  Administered 2021-05-14: 1000 mg via ORAL

## 2021-05-13 MED ORDER — LACTATED RINGERS IV SOLN: INTRAVENOUS | Status: DC

## 2021-05-13 MED ORDER — CHLORHEXIDINE GLUCONATE CLOTH 2 % EX PADS: 6.0000 | MEDICATED_PAD | Freq: Once | CUTANEOUS | Status: DC

## 2021-05-14 ENCOUNTER — Ambulatory Visit: Payer: Medicaid Other | Admitting: Anesthesiology

## 2021-05-14 ENCOUNTER — Encounter
Admission: RE | Disposition: A | Discharge status: Home / Self Care | Payer: Self-pay | Source: Home / Self Care | Attending: Surgery

## 2021-05-14 ENCOUNTER — Other Ambulatory Visit: Payer: Self-pay

## 2021-05-14 ENCOUNTER — Encounter: Payer: Self-pay | Admitting: Surgery

## 2021-05-14 ENCOUNTER — Ambulatory Visit
Admission: RE | Admit: 2021-05-14 | Discharge: 2021-05-14 | Disposition: A | Discharge status: Home / Self Care | Payer: Medicaid Other | Attending: Surgery | Admitting: Surgery

## 2021-05-14 DIAGNOSIS — Z87891 Personal history of nicotine dependence: Secondary | ICD-10-CM | POA: Diagnosis not present

## 2021-05-14 DIAGNOSIS — K8010 Calculus of gallbladder with chronic cholecystitis without obstruction: Secondary | ICD-10-CM | POA: Insufficient documentation

## 2021-05-14 LAB — CBC WITH DIFFERENTIAL/PLATELET
Abs Immature Granulocytes: 0.01 10*3/uL (ref 0.00–0.07)
Basophils Absolute: 0 10*3/uL (ref 0.0–0.1)
Basophils Relative: 0 %
Eosinophils Absolute: 0.3 10*3/uL (ref 0.0–0.5)
Eosinophils Relative: 3 %
HCT: 39.2 % (ref 36.0–46.0)
Hemoglobin: 12.8 g/dL (ref 12.0–15.0)
Immature Granulocytes: 0 %
Lymphocytes Relative: 32 %
Lymphs Abs: 2.8 10*3/uL (ref 0.7–4.0)
MCH: 31.3 pg (ref 26.0–34.0)
MCHC: 32.7 g/dL (ref 30.0–36.0)
MCV: 95.8 fL (ref 80.0–100.0)
Monocytes Absolute: 0.5 10*3/uL (ref 0.1–1.0)
Monocytes Relative: 6 %
Neutro Abs: 5.2 10*3/uL (ref 1.7–7.7)
Neutrophils Relative %: 59 %
Platelets: 251 10*3/uL (ref 150–400)
RBC: 4.09 MIL/uL (ref 3.87–5.11)
RDW: 11.9 % (ref 11.5–15.5)
WBC: 8.8 10*3/uL (ref 4.0–10.5)
nRBC: 0 % (ref 0.0–0.2)

## 2021-05-14 LAB — COMPREHENSIVE METABOLIC PANEL
ALT: 23 U/L (ref 0–44)
AST: 19 U/L (ref 15–41)
Albumin: 4.4 g/dL (ref 3.5–5.0)
Alkaline Phosphatase: 40 U/L (ref 38–126)
Anion gap: 8 (ref 5–15)
BUN: 15 mg/dL (ref 6–20)
CO2: 25 mmol/L (ref 22–32)
Calcium: 9.2 mg/dL (ref 8.9–10.3)
Chloride: 106 mmol/L (ref 98–111)
Creatinine, Ser: 0.82 mg/dL (ref 0.44–1.00)
GFR, Estimated: >60 mL/min (ref >60)
Glucose, Bld: 94 mg/dL (ref 70–99)
Potassium: 3.8 mmol/L (ref 3.5–5.1)
Sodium: 139 mmol/L (ref 135–145)
Total Bilirubin: 0.8 mg/dL (ref 0.3–1.2)
Total Protein: 8.2 g/dL — ABNORMAL HIGH (ref 6.5–8.1)

## 2021-05-14 LAB — POCT PREGNANCY, URINE: Preg Test, Ur: NEGATIVE

## 2021-05-14 SURGERY — CHOLECYSTECTOMY, ROBOT-ASSISTED, LAPAROSCOPIC
Anesthesia: General | Site: Abdomen

## 2021-05-14 MED ORDER — ONDANSETRON HCL 4 MG/2ML IJ SOLN
INTRAMUSCULAR | Status: AC
  Filled 2021-05-14: qty 2

## 2021-05-14 MED ORDER — IBUPROFEN 800 MG PO TABS: 800.0000 mg | ORAL_TABLET | Freq: Three times a day (TID) | ORAL | 0 refills | Status: DC | PRN

## 2021-05-14 MED ORDER — PROPOFOL 10 MG/ML IV BOLUS
INTRAVENOUS | Status: DC | PRN
  Administered 2021-05-14: 110 mg via INTRAVENOUS

## 2021-05-14 MED ORDER — CEFAZOLIN SODIUM-DEXTROSE 2-4 GM/100ML-% IV SOLN
INTRAVENOUS | Status: AC
  Filled 2021-05-14: qty 100

## 2021-05-14 MED ORDER — LIDOCAINE HCL (PF) 2 % IJ SOLN
INTRAMUSCULAR | Status: AC
  Filled 2021-05-14: qty 5

## 2021-05-14 MED ORDER — BUPIVACAINE LIPOSOME 1.3 % IJ SUSP
INTRAMUSCULAR | Status: AC
  Filled 2021-05-14: qty 20

## 2021-05-14 MED ORDER — DEXAMETHASONE SODIUM PHOSPHATE 10 MG/ML IJ SOLN
INTRAMUSCULAR | Status: AC
  Filled 2021-05-14: qty 1

## 2021-05-14 MED ORDER — ROCURONIUM BROMIDE 100 MG/10ML IV SOLN
INTRAVENOUS | Status: DC | PRN
  Administered 2021-05-14: 40 mg via INTRAVENOUS

## 2021-05-14 MED ORDER — PHENYLEPHRINE HCL (PRESSORS) 10 MG/ML IV SOLN
INTRAVENOUS | Status: DC | PRN
  Administered 2021-05-14: 120 ug via INTRAVENOUS
  Administered 2021-05-14 (×2): 80 ug via INTRAVENOUS

## 2021-05-14 MED ORDER — PHENYLEPHRINE 40 MCG/ML (10ML) SYRINGE FOR IV PUSH (FOR BLOOD PRESSURE SUPPORT)
PREFILLED_SYRINGE | INTRAVENOUS | Status: AC
  Filled 2021-05-14: qty 10

## 2021-05-14 MED ORDER — ONDANSETRON HCL 4 MG/2ML IJ SOLN: 4.0000 mg | Freq: Once | INTRAMUSCULAR | Status: DC | PRN

## 2021-05-14 MED ORDER — FENTANYL CITRATE (PF) 100 MCG/2ML IJ SOLN: 25.0000 ug | INTRAMUSCULAR | Status: DC | PRN

## 2021-05-14 MED ORDER — MIDAZOLAM HCL 2 MG/2ML IJ SOLN
INTRAMUSCULAR | Status: DC | PRN
  Administered 2021-05-14: 2 mg via INTRAVENOUS

## 2021-05-14 MED ORDER — ROCURONIUM BROMIDE 10 MG/ML (PF) SYRINGE
PREFILLED_SYRINGE | INTRAVENOUS | Status: AC
  Filled 2021-05-14: qty 10

## 2021-05-14 MED ORDER — DEXAMETHASONE SODIUM PHOSPHATE 10 MG/ML IJ SOLN
INTRAMUSCULAR | Status: DC | PRN
  Administered 2021-05-14: 5 mg via INTRAVENOUS

## 2021-05-14 MED ORDER — 0.9 % SODIUM CHLORIDE (POUR BTL) OPTIME
TOPICAL | Status: DC | PRN
  Administered 2021-05-14: 500 mL

## 2021-05-14 MED ORDER — LIDOCAINE HCL (CARDIAC) PF 100 MG/5ML IV SOSY
PREFILLED_SYRINGE | INTRAVENOUS | Status: DC | PRN
  Administered 2021-05-14: 40 mg via INTRAVENOUS

## 2021-05-14 MED ORDER — FENTANYL CITRATE (PF) 100 MCG/2ML IJ SOLN
INTRAMUSCULAR | Status: AC
  Administered 2021-05-14: 25 ug via INTRAVENOUS
  Filled 2021-05-14: qty 2

## 2021-05-14 MED ORDER — DEXMEDETOMIDINE (PRECEDEX) IN NS 20 MCG/5ML (4 MCG/ML) IV SYRINGE
PREFILLED_SYRINGE | INTRAVENOUS | Status: DC | PRN
  Administered 2021-05-14: 4 ug via INTRAVENOUS

## 2021-05-14 MED ORDER — OXYCODONE HCL 5 MG PO TABS
5.0000 mg | ORAL_TABLET | Freq: Once | ORAL | Status: AC
  Administered 2021-05-14: 5 mg via ORAL

## 2021-05-14 MED ORDER — INDOCYANINE GREEN 25 MG IV SOLR
2.5000 mg | Freq: Once | INTRAVENOUS | Status: AC
  Administered 2021-05-14: 2.5 mg via INTRAVENOUS
  Filled 2021-05-14: qty 1

## 2021-05-14 MED ORDER — FAMOTIDINE 20 MG PO TABS
ORAL_TABLET | ORAL | Status: AC
  Filled 2021-05-14: qty 1

## 2021-05-14 MED ORDER — ONDANSETRON HCL 4 MG/2ML IJ SOLN
INTRAMUSCULAR | Status: DC | PRN
  Administered 2021-05-14: 4 mg via INTRAVENOUS

## 2021-05-14 MED ORDER — CHLORHEXIDINE GLUCONATE 0.12 % MT SOLN
OROMUCOSAL | Status: AC
  Filled 2021-05-14: qty 15

## 2021-05-14 MED ORDER — GABAPENTIN 300 MG PO CAPS
ORAL_CAPSULE | ORAL | Status: AC
  Filled 2021-05-14: qty 1

## 2021-05-14 MED ORDER — FENTANYL CITRATE (PF) 100 MCG/2ML IJ SOLN
INTRAMUSCULAR | Status: DC | PRN
  Administered 2021-05-14 (×2): 50 ug via INTRAVENOUS

## 2021-05-14 MED ORDER — CELECOXIB 200 MG PO CAPS
ORAL_CAPSULE | ORAL | Status: AC
  Filled 2021-05-14: qty 1

## 2021-05-14 MED ORDER — FENTANYL CITRATE (PF) 100 MCG/2ML IJ SOLN
25.0000 ug | INTRAMUSCULAR | Status: DC | PRN
  Administered 2021-05-14 (×3): 25 ug via INTRAVENOUS

## 2021-05-14 MED ORDER — MIDAZOLAM HCL 2 MG/2ML IJ SOLN
INTRAMUSCULAR | Status: AC
  Filled 2021-05-14: qty 2

## 2021-05-14 MED ORDER — BUPIVACAINE-EPINEPHRINE (PF) 0.25% -1:200000 IJ SOLN
INTRAMUSCULAR | Status: DC | PRN
  Administered 2021-05-14: 30 mL

## 2021-05-14 MED ORDER — SUGAMMADEX SODIUM 200 MG/2ML IV SOLN
INTRAVENOUS | Status: DC | PRN
  Administered 2021-05-14: 110 mg via INTRAVENOUS

## 2021-05-14 MED ORDER — ACETAMINOPHEN 500 MG PO TABS
ORAL_TABLET | ORAL | Status: AC
  Filled 2021-05-14: qty 2

## 2021-05-14 MED ORDER — FENTANYL CITRATE (PF) 100 MCG/2ML IJ SOLN
INTRAMUSCULAR | Status: AC
Start: 2021-05-14 — End: ?
  Filled 2021-05-14: qty 2

## 2021-05-14 MED ORDER — OXYCODONE HCL 5 MG PO TABS
ORAL_TABLET | ORAL | Status: AC
  Filled 2021-05-14: qty 1

## 2021-05-14 MED ORDER — BUPIVACAINE-EPINEPHRINE (PF) 0.25% -1:200000 IJ SOLN
INTRAMUSCULAR | Status: AC
  Filled 2021-05-14: qty 30

## 2021-05-14 MED ORDER — HYDROCODONE-ACETAMINOPHEN 5-325 MG PO TABS: 1.0000 | ORAL_TABLET | Freq: Four times a day (QID) | ORAL | 0 refills | Status: DC | PRN

## 2021-05-14 SURGICAL SUPPLY — 49 items
ADH SKN CLS APL DERMABOND .7 (GAUZE/BANDAGES/DRESSINGS) ×1
BAG RETRIEVAL 10 (BASKET)
CANNULA CAP OBTURATR AIRSEAL 8 (CAP) ×2 IMPLANT
CLIP LIGATING HEM O LOK PURPLE (MISCELLANEOUS) ×1 IMPLANT
COVER TIP SHEARS 8 DVNC (MISCELLANEOUS) ×1 IMPLANT
COVER TIP SHEARS 8MM DA VINCI (MISCELLANEOUS) ×1
DERMABOND ADVANCED (GAUZE/BANDAGES/DRESSINGS) ×1
DERMABOND ADVANCED .7 DNX12 (GAUZE/BANDAGES/DRESSINGS) ×1 IMPLANT
DRAPE ARM DVNC X/XI (DISPOSABLE) ×4 IMPLANT
DRAPE COLUMN DVNC XI (DISPOSABLE) ×1 IMPLANT
DRAPE DA VINCI XI ARM (DISPOSABLE) ×4
DRAPE DA VINCI XI COLUMN (DISPOSABLE) ×1
ELECT CAUTERY BLADE 6.4 (BLADE) ×2 IMPLANT
GLOVE SURG ORTHO LTX SZ7.5 (GLOVE) ×4 IMPLANT
GOWN STRL REUS W/ TWL LRG LVL3 (GOWN DISPOSABLE) ×2 IMPLANT
GOWN STRL REUS W/ TWL XL LVL3 (GOWN DISPOSABLE) ×2 IMPLANT
GOWN STRL REUS W/TWL LRG LVL3 (GOWN DISPOSABLE) ×4
GOWN STRL REUS W/TWL XL LVL3 (GOWN DISPOSABLE) ×4
GRASPER SUT TROCAR 14GX15 (MISCELLANEOUS) IMPLANT
INFUSOR MANOMETER BAG 3000ML (MISCELLANEOUS) IMPLANT
IRRIGATION STRYKERFLOW (MISCELLANEOUS) IMPLANT
IRRIGATOR STRYKERFLOW (MISCELLANEOUS)
IRRIGATOR SUCT 8 DISP DVNC XI (IRRIGATION / IRRIGATOR) IMPLANT
IRRIGATOR SUCTION 8MM XI DISP (IRRIGATION / IRRIGATOR)
IV NS IRRIG 3000ML ARTHROMATIC (IV SOLUTION) IMPLANT
KIT PINK PAD W/HEAD ARE REST (MISCELLANEOUS) ×2
KIT PINK PAD W/HEAD ARM REST (MISCELLANEOUS) ×1 IMPLANT
KIT TURNOVER KIT A (KITS) ×2 IMPLANT
LABEL OR SOLS (LABEL) ×2 IMPLANT
MANIFOLD NEPTUNE II (INSTRUMENTS) ×2 IMPLANT
NDL INSUFFLATION 14GA 120MM (NEEDLE) IMPLANT
NEEDLE HYPO 22GX1.5 SAFETY (NEEDLE) ×2 IMPLANT
NEEDLE INSUFFLATION 14GA 120MM (NEEDLE) IMPLANT
NS IRRIG 500ML POUR BTL (IV SOLUTION) ×2 IMPLANT
PACK LAP CHOLECYSTECTOMY (MISCELLANEOUS) ×2 IMPLANT
PENCIL ELECTRO HAND CTR (MISCELLANEOUS) ×2 IMPLANT
SEAL CANN UNIV 5-8 DVNC XI (MISCELLANEOUS) ×3 IMPLANT
SEAL XI 5MM-8MM UNIVERSAL (MISCELLANEOUS) ×3
SET TUBE FILTERED XL AIRSEAL (SET/KITS/TRAYS/PACK) ×2 IMPLANT
SOLUTION ELECTROLUBE (MISCELLANEOUS) ×2 IMPLANT
SPIKE FLUID TRANSFER (MISCELLANEOUS) ×2 IMPLANT
SUT MNCRL 4-0 (SUTURE) ×2
SUT MNCRL 4-0 27XMFL (SUTURE) ×1
SUT VICRYL 0 AB UR-6 (SUTURE) ×1 IMPLANT
SUTURE MNCRL 4-0 27XMF (SUTURE) ×1 IMPLANT
SYS BAG RETRIEVAL 10MM (BASKET)
SYSTEM BAG RETRIEVAL 10MM (BASKET) ×1 IMPLANT
TROCAR Z-THREAD FIOS 11X100 BL (TROCAR) IMPLANT
WATER STERILE IRR 500ML POUR (IV SOLUTION) ×2 IMPLANT

## 2021-05-14 NOTE — Progress Notes (Signed)
Pt states she needs work note from Dr. Claudine Mouton. Contacted office, informed front desk, states she will notify nurse to write work note, note will be placed in MyChart. Pt verbalizes understanding.  ?

## 2021-05-14 NOTE — Anesthesia Procedure Notes (Signed)
Procedure Name: Intubation ?Date/Time: 05/14/2021 1:14 PM ?Performed by: Daryel Gerald, RN ?Pre-anesthesia Checklist: Patient identified, Patient being monitored, Timeout performed, Emergency Drugs available and Suction available ?Patient Re-evaluated:Patient Re-evaluated prior to induction ?Oxygen Delivery Method: Circle system utilized ?Preoxygenation: Pre-oxygenation with 100% oxygen ?Induction Type: IV induction ?Ventilation: Mask ventilation without difficulty ?Laryngoscope Size: Sabra Heck and 2 ?Grade View: Grade I ?Tube type: Oral ?Tube size: 6.5 mm ?Number of attempts: 1 ?Airway Equipment and Method: Stylet ?Placement Confirmation: ETT inserted through vocal cords under direct vision, positive ETCO2 and breath sounds checked- equal and bilateral ?Secured at: 22 cm ?Tube secured with: Tape ?Dental Injury: Teeth and Oropharynx as per pre-operative assessment  ? ? ? ? ?

## 2021-05-14 NOTE — Anesthesia Postprocedure Evaluation (Signed)
Anesthesia Post Note ? ?Patient: Veronica Brooks ? ?Procedure(s) Performed: XI ROBOTIC ASSISTED LAPAROSCOPIC CHOLECYSTECTOMY (Abdomen) ?INDOCYANINE GREEN FLUORESCENCE IMAGING (ICG) (Abdomen) ? ?Patient location during evaluation: PACU ?Anesthesia Type: General ?Level of consciousness: awake and awake and alert ?Pain management: satisfactory to patient ?Vital Signs Assessment: post-procedure vital signs reviewed and stable ?Respiratory status: spontaneous breathing and nonlabored ventilation ?Cardiovascular status: stable ?Anesthetic complications: no ? ? ?No notable events documented. ? ? ?Last Vitals:  ?Vitals:  ? 05/14/21 1425 05/14/21 1430  ?BP:  119/69  ?Pulse: 87 82  ?Resp: 20 18  ?Temp:  36.6 ?C  ?SpO2:    ?  ?Last Pain:  ?Vitals:  ? 05/14/21 1430  ?TempSrc:   ?PainSc: Asleep  ? ? ?  ?  ?  ?  ?  ?  ? ?VAN STAVEREN,Denai Caba ? ? ? ? ?

## 2021-05-14 NOTE — Op Note (Signed)
Robotic cholecystectomy with Indocyamine Green Ductal Imaging.  ? ?Pre-operative Diagnosis: Chronic calculus cholecystitis ? ?Post-operative Diagnosis:  Same. ? ?Procedure: Robotic assisted laparoscopic cholecystectomy with Indocyamine Green Ductal Imaging.  ? ?Surgeon: Campbell Lerner, M.D., FACS ? ?Anesthesia: General. with endotracheal tube ? ?Findings: Gallbladder with 5 large stones, well visualized and elongated cystic duct. ? ?Estimated Blood Loss: 5 mL ?        ?Drains: None ?        ?Specimens: Gallbladder     ?      ?Complications: none ? ?Procedure Details  ?The patient was seen again in the Holding Room.  2.5 mg dose of ICG was administered intravenously.   ?The benefits, complications, treatment options, risks and expected outcomes were again reviewed with the patient. The likelihood of improving the patient's symptoms with return to their baseline status is good.  The patient and/or family concurred with the proposed plan, giving informed consent, again alternatives reviewed.  The patient was taken to Operating Room, identified, and the procedure verified as robotic assisted laparoscopic cholecystectomy. ? ?Prior to the induction of general anesthesia, antibiotic prophylaxis was administered. VTE prophylaxis was in place. General endotracheal anesthesia was then administered and tolerated well. The patient was positioned in the supine position.  After the induction, the abdomen was prepped with Chloraprep and draped in the sterile fashion.  ?A Time Out was held and the above information confirmed. ? ?After local infiltration of quarter percent Marcaine with epinephrine, stab incision was made left upper quadrant.  Just below the costal margin at Palmer's point, approximately midclavicular line the Veres needle is passed with sensation of the layers to penetrate the abdominal wall and into the peritoneum.  Saline drop test is confirmed peritoneal placement.  Insufflation is initiated with carbon dioxide  to pressures of 15 mmHg. ? ?Right infra-umbilical local infiltration with quarter percent Marcaine with epinephrine is utilized.  Made a 12 mm incision on the right periumbilical site, I advanced an optical 17mm port under direct visualization into the peritoneal cavity.  Once the peritoneum was penetrated, insufflation was initiated.  The trocar was then advanced into the abdominal cavity under direct visualization. ?Pneumoperitoneum was then continued with Air seal utilizing CO2 at 15 mmHg or less and tolerated well without any adverse changes in the patient's vital signs.  Two 8.5-mm ports were placed in the left lower quadrant and laterally, and one to the right lower quadrant, all under direct vision. All skin incisions  were infiltrated with a local anesthetic agent before making the incision and placing the trocars.  ?The patient was positioned  in reverse Trendelenburg, tilted the patient's left side down.  Da Vinci XI robot was then positioned on to the patient's left side, and docked. ? ?The gallbladder was identified, the fundus grasped via the arm 4 Prograsp and retracted cephalad. Adhesions were lysed with scissors and cautery.  The infundibulum was identified grasped and retracted laterally, exposing the peritoneum overlying the triangle of Calot. This was then opened and dissected using cautery & scissors. An extended critical view of the cystic duct and cystic artery was obtained, aided by the ICG via FireFly which enabled ready visualization of the ductal anatomy.   ? ? ? ? ? ?The cystic duct was clearly identified and dissected to isolation.   Artery well isolated and clipped, and the cystic duct was triple clipped and divided with scissors, as close to the gallbladder neck as feasible, thus leaving two on the remaining stump.  The  specimen side of the artery is sealed with bipolar and divided with monopolar scissors.  ?A tiny accessory duct was noted on sensitive firefly, and appeared  occluded. ?The gallbladder was taken from the gallbladder fossa in a retrograde fashion with the electrocautery. The gallbladder was removed and placed in an Endocatch bag.  ?The liver bed is inspected. Hemostasis was confirmed.  ?The robot was undocked and moved away from the operative field. ?No irrigation was utilized. The gallbladder and Endocatch sac were then removed through the infraumbilical port site.  ? ?Inspection of the right upper quadrant was performed. No bleeding, bile duct injury or leak, or bowel injury was noted. The infra-umbilical port site fascia was closed with interrumpted 0 Vicryl sutures under direct visualization. Pneumoperitoneum was released and ports removed.  4-0 subcuticular Monocryl was used to close the skin. Dermabond was  applied.  The patient was then extubated and brought to the recovery room in stable condition. Sponge, lap, and needle counts were correct at closure and at the conclusion of the case.  ?        ?     ?Campbell Lerner, M.D., FACS ?05/14/2021 2:19 PM ? ?

## 2021-05-14 NOTE — Transfer of Care (Signed)
Immediate Anesthesia Transfer of Care Note ? ?Patient: Veronica Brooks ? ?Procedure(s) Performed: XI ROBOTIC ASSISTED LAPAROSCOPIC CHOLECYSTECTOMY (Abdomen) ?INDOCYANINE GREEN FLUORESCENCE IMAGING (ICG) (Abdomen) ? ?Patient Location: PACU ? ?Anesthesia Type:General ? ?Level of Consciousness: drowsy ? ?Airway & Oxygen Therapy: Patient Spontanous Breathing and Patient connected to face mask oxygen ? ?Post-op Assessment: Report given to RN and Post -op Vital signs reviewed and stable ? ?Post vital signs: Reviewed and stable ? ?Last Vitals:  ?Vitals Value Taken Time  ?BP 123/70 05/14/21 1418  ?Temp    ?Pulse 95 05/14/21 1419  ?Resp 21 05/14/21 1420  ?SpO2 97 % 05/14/21 1419  ?Vitals shown include unvalidated device data. ? ?Last Pain:  ?Vitals:  ? 05/14/21 1058  ?TempSrc: Oral  ?PainSc: 0-No pain  ?   ? ?  ? ?Complications: No notable events documented. ?

## 2021-05-14 NOTE — Interval H&P Note (Signed)
History and Physical Interval Note: ? ?05/14/2021 ?12:40 PM ? ?Veronica Brooks  has presented today for surgery, with the diagnosis of chronic calculous cholecystitis.  The various methods of treatment have been discussed with the patient and family. After consideration of risks, benefits and other options for treatment, the patient has consented to  Procedure(s): ?XI ROBOTIC ASSISTED LAPAROSCOPIC CHOLECYSTECTOMY (N/A) ?INDOCYANINE GREEN FLUORESCENCE IMAGING (ICG) (N/A) as a surgical intervention.  The patient's history has been reviewed, patient examined, no change in status, stable for surgery.  I have reviewed the patient's chart and labs.  Questions were answered to the patient's satisfaction.   ? ? ?Campbell Lerner ? ? ?

## 2021-05-14 NOTE — Discharge Instructions (Signed)

## 2021-05-14 NOTE — Anesthesia Preprocedure Evaluation (Signed)
Anesthesia Evaluation  ?Patient identified by MRN, date of birth, ID band ?Patient awake ? ? ? ?Reviewed: ?Allergy & Precautions, NPO status , Patient's Chart, lab work & pertinent test results ? ?Airway ?Mallampati: II ? ?TM Distance: >3 FB ?Neck ROM: Full ? ? ? Dental ? ?(+) Teeth Intact ?  ?Pulmonary ?neg pulmonary ROS, Patient abstained from smoking., former smoker,  ?  ?Pulmonary exam normal ?breath sounds clear to auscultation ? ? ? ? ? ? Cardiovascular ?Exercise Tolerance: Good ?negative cardio ROS ?Normal cardiovascular exam ?Rhythm:Regular Rate:Normal ? ? ?  ?Neuro/Psych ?Seizures -, Well Controlled,  Depression negative neurological ROS ? negative psych ROS  ? GI/Hepatic ?negative GI ROS, Neg liver ROS,   ?Endo/Other  ?negative endocrine ROS ? Renal/GU ?negative Renal ROS  ?negative genitourinary ?  ?Musculoskeletal ?negative musculoskeletal ROS ?(+)  ? Abdominal ?Normal abdominal exam  (+)   ?Peds ?negative pediatric ROS ?(+)  Hematology ?negative hematology ROS ?(+)   ?Anesthesia Other Findings ?Past Medical History: ?03/08/2016: Acetaminophen overdose, intentional self-harm, initial  ?encounter (HCC) ?No date: Bronchitis ?    Comment:  chronic ?No date: Depression ?No date: Heart murmur ?    Comment:  Does not require cardiologist ? ?Past Surgical History: ?01/03/2019: TUBAL LIGATION; N/A ?    Comment:  Procedure: POST PARTUM TUBAL LIGATION;  Surgeon:  ?             Levie Heritage, DO;  Location: [facility]S;  Service:  ?             Gynecology;  Laterality: N/A; ? ? ? ? Reproductive/Obstetrics ?negative OB ROS ? ?  ? ? ? ? ? ? ? ? ? ? ? ? ? ?  ?  ? ? ? ? ? ? ? ? ?Anesthesia Physical ?Anesthesia Plan ? ?ASA: 2 ? ?Anesthesia Plan: General  ? ?Post-op Pain Management:   ? ?Induction: Intravenous ? ?PONV Risk Score and Plan: 1 and Ondansetron and Dexamethasone ? ?Airway Management Planned: Oral ETT ? ?Additional Equipment:  ? ?Intra-op Plan:  ? ?Post-operative Plan:  Extubation in OR ? ?Informed Consent: I have reviewed the patients History and Physical, chart, labs and discussed the procedure including the risks, benefits and alternatives for the proposed anesthesia with the patient or authorized representative who has indicated his/her understanding and acceptance.  ? ? ? ?Dental Advisory Given ? ?Plan Discussed with: CRNA and Surgeon ? ?Anesthesia Plan Comments:   ? ? ? ? ? ? ?Anesthesia Quick Evaluation ? ?

## 2021-05-16 LAB — SURGICAL PATHOLOGY

## 2021-05-29 ENCOUNTER — Encounter: Payer: Self-pay | Admitting: Physician Assistant

## 2021-05-29 ENCOUNTER — Ambulatory Visit (INDEPENDENT_AMBULATORY_CARE_PROVIDER_SITE_OTHER): Payer: Medicaid Other | Admitting: Physician Assistant

## 2021-05-29 VITALS — BP 113/77 | HR 78 | Temp 89.9°F | Ht 61.0 in | Wt 119.6 lb

## 2021-05-29 DIAGNOSIS — K8010 Calculus of gallbladder with chronic cholecystitis without obstruction: Secondary | ICD-10-CM

## 2021-05-29 DIAGNOSIS — Z09 Encounter for follow-up examination after completed treatment for conditions other than malignant neoplasm: Secondary | ICD-10-CM

## 2021-05-29 NOTE — Patient Instructions (Signed)
The nausea and soreness should get better with time. Let us know if it does not.  ?Follow-up with our office as needed. ? ?Please call and ask to speak with a nurse if you develop questions or concerns. ? ? ?GENERAL POST-OPERATIVE ?PATIENT INSTRUCTIONS  ? ?WOUND CARE INSTRUCTIONS: Try to keep the wound dry and avoid ointments on the wound unless directed to do so.  If the wound becomes bright red and painful or starts to drain infected material that is not clear, please contact your physician immediately.  If the wound is mildly pink and has a thick firm ridge underneath it, this is normal, and is referred to as a healing ridge.  This will resolve over the next 4-6 weeks. ? ?BATHING: ?You may shower if you have been informed of this by your surgeon. However, Please do not submerge in a tub, hot tub, or pool until incisions are completely sealed or have been told by your surgeon that you may do so. ? ?DIET:  You may eat any foods that you can tolerate.  It is a good idea to eat a high fiber diet and take in plenty of fluids to prevent constipation.  If you do become constipated you may want to take a mild laxative or take ducolax tablets on a daily basis until your bowel habits are regular.  Constipation can be very uncomfortable, along with straining, after recent surgery. ? ?ACTIVITY: You are encouraged to walk and engage in light activity for the next two weeks.  You should not lift more than 20 pounds for 6 weeks total after surgery as it could put you at increased risk for complications.  Twenty pounds is roughly equivalent to a plastic bag of groceries. At that time- Listen to your body when lifting, if you have pain when lifting, stop and then try again in a few days. Soreness after doing exercises or activities of daily living is normal as you get back in to your normal routine. ? ?MEDICATIONS:  Try to take narcotic medications and anti-inflammatory medications, such as tylenol, ibuprofen, naprosyn, etc.,  with food.  This will minimize stomach upset from the medication.  Should you develop nausea and vomiting from the pain medication, or develop a rash, please discontinue the medication and contact your physician.  You should not drive, make important decisions, or operate machinery when taking narcotic pain medication. ? ?SUNBLOCK ?Use sun block to incision area over the next year if this area will be exposed to sun. This helps decrease scarring and will allow you avoid a permanent darkened area over your incision. ? ?QUESTIONS:  Please feel free to call our office if you have any questions, and we will be glad to assist you. [phone] ? ? ?

## 2021-05-29 NOTE — Progress Notes (Signed)
[facility] ?POST-OP OFFICE VISIT ? ?05/29/2021 ? ?HPI: ?Veronica Brooks is a 26 y.o. female 15 days s/p robotic assisted laparoscopic cholecystectomy for [facility] with Dr Claudine Mouton ? ?She is doing well ?Some RUQ soreness and sensitivity at incisions ?She is only using ibuprofen and this helps; does not want additional pain medications ?Intermittent prandial nausea; no emesis ?No fever, chills, diarrhea ?Incisions are healing well ? ?Vital signs: ?There were no vitals taken for this visit.  ? ?Physical Exam: ?Constitutional: Well appearing female, NAD ?Abdomen: Soft, non-tender, non-distended, no rebound/guarding ?Skin: Laparoscopic incisions are healing well, no erythema or drainage  ? ?Assessment/Plan: ?This is a 26 y.o. female 15 days s/p robotic assisted laparoscopic cholecystectomy for [facility]  ? ? - Pain control prn ? - Reviewed wound care recommendation ? - Reviewed lifting restrictions; 4 weeks total - Return to work note given  ? - Reviewed surgical pathology; [facility] ? - She can follow up on as needed basis; She understands to call with questions/concerns ? ?-- ?Lynden Oxford, PA-C ?[facility] ?05/29/2021, 1:50 PM ?[phone] ?M-F: 7am - 4pm  ?

## 2022-03-26 ENCOUNTER — Emergency Department
Admission: EM | Admit: 2022-03-26 | Discharge: 2022-03-26 | Disposition: A | Discharge status: Home / Self Care | Payer: Medicaid Other | Attending: Emergency Medicine | Admitting: Emergency Medicine

## 2022-03-26 DIAGNOSIS — L089 Local infection of the skin and subcutaneous tissue, unspecified: Secondary | ICD-10-CM | POA: Insufficient documentation

## 2022-03-26 DIAGNOSIS — M79641 Pain in right hand: Secondary | ICD-10-CM | POA: Diagnosis not present

## 2022-03-26 MED ORDER — SULFAMETHOXAZOLE-TRIMETHOPRIM 800-160 MG PO TABS
1.0000 | ORAL_TABLET | Freq: Two times a day (BID) | ORAL | 0 refills | Status: DC
Start: 2022-03-26 — End: 2022-12-10

## 2022-03-26 NOTE — ED Notes (Signed)
Pt never arrived to assigned room. This RN was informed by registration staff that she left. Pt left before discharge instructions provided

## 2022-03-26 NOTE — ED Triage Notes (Signed)
Pt sts that she went and got a tattoo done three days ago and she believes it is infected. Pt sts that she has went to the same tattoo artist before.

## 2022-03-26 NOTE — ED Provider Notes (Signed)
[facility] Provider Note    Event Date/Time   First MD Initiated Contact with Patient 03/26/22 1511     (approximate)   History   Wound Infection   HPI  Veronica Brooks is a 27 y.o. female  with history of seizure, depression, bronchitis and as listed in EMR presents to the emergency department for treatment and evaluation of right hand pain. She got a tattoo 3 days ago and believes part of it is infected. No fever. She has had some drainage and scabbing from the area with surrounding redness.    Physical Exam   Triage Vital Signs: ED Triage Vitals  Enc Vitals Group     BP 03/26/22 1503 (!) 135/93     Pulse Rate 03/26/22 1503 (!) 58     Resp 03/26/22 1503 18     Temp 03/26/22 1503 98.6 F (37 C)     Temp Source 03/26/22 1503 Oral     SpO2 03/26/22 1503 100 %     Weight 03/26/22 1504 138 lb (62.6 kg)     Height --      Head Circumference --      Peak Flow --      Pain Score 03/26/22 1504 3     Pain Loc --      Pain Edu? --      Excl. in [place]? --     Most recent vital signs: Vitals:   03/26/22 1503  BP: (!) 135/93  Pulse: (!) 58  Resp: 18  Temp: 98.6 F (37 C)  SpO2: 100%    General: Awake, no distress.  CV:  Good peripheral perfusion.  Resp:  Normal effort.  Abd:  No distention.  Other:  Tattoo on dorsal aspect of the right hand with surrounding erythema and central scabbing.   ED Results / Procedures / Treatments   Labs (all labs ordered are listed, but only abnormal results are displayed) Labs Reviewed - No data to display   EKG     RADIOLOGY   PROCEDURES:  Critical Care performed: No  Procedures   MEDICATIONS ORDERED IN ED:  Medications - No data to display   IMPRESSION / MDM / [place] / ED COURSE   I have reviewed the triage note.  Differential diagnosis includes, but is not limited to, cellulitis, wound infection,   Patient's presentation is most consistent with acute illness / injury with  system symptoms.  27 year old female presenting to the emergency department for evaluation of tattoo on the right hand.  See HPI for further details.  She has used Aquaphor and A&D ointment without improvement.  Area surrounding the tattoo is mildly erythematous.  She has some central scabbing to the area.  Plan will be to treat her with Bactrim and have her use antibiotic ointment on the area twice a day.  If she is not improving over the next few days while on the antibiotic she is to follow-up with her primary care provider or return to the emergency department.     FINAL CLINICAL IMPRESSION(S) / ED DIAGNOSES   Final diagnoses:  Wound infection     Rx / DC Orders   ED Discharge Orders          Ordered    sulfamethoxazole-trimethoprim (BACTRIM DS) 800-160 MG tablet  2 times daily        03/26/22 1515             Note:  This document was prepared  using Systems analyst and may include unintentional dictation errors.   Victorino Dike, FNP 03/26/22 1733    Nathaniel Man, MD 03/26/22 1946

## 2022-12-23 NOTE — Pre-Procedure Instructions (Signed)
Surgical Instructions   Your procedure is scheduled on January 01, 2023. Report to [facility] Main Entrance "A" at 9:30 A.M., then check in with the Admitting office. Any questions or running late day of surgery: call [phone]  Questions prior to your surgery date: call [phone], Monday-Friday, 8am-4pm. If you experience any cold or flu symptoms such as cough, fever, chills, shortness of breath, etc. between now and your scheduled surgery, please notify us at the above number.     Remember:  Do not eat after midnight the night before your surgery   You may drink clear liquids until 8:30 AM the morning of your surgery.   Clear liquids allowed are: Water, Non-Citrus Juices (without pulp), Carbonated Beverages, Clear Tea, Black Coffee Only (NO MILK, CREAM OR POWDERED CREAMER of any kind), and Gatorade.    Take these medicines the morning of surgery with A SIP OF WATER: acetaminophen (TYLENOL) - may take if needed   One week prior to surgery, STOP taking any Aspirin (unless otherwise instructed by your surgeon) Aleve, Naproxen, Ibuprofen, Motrin, Advil, Goody's, BC's, all herbal medications, fish oil, and non-prescription vitamins.                     Do NOT Smoke (Tobacco/Vaping) for 24 hours prior to your procedure.  If you use a CPAP at night, you may bring your mask/headgear for your overnight stay.   You will be asked to remove any contacts, glasses, piercing's, hearing aid's, dentures/partials prior to surgery. Please bring cases for these items if needed.    Patients discharged the day of surgery will not be allowed to drive home, and someone needs to stay with them for 24 hours.  SURGICAL WAITING ROOM VISITATION Patients may have no more than 2 support people in the waiting area - these visitors may rotate.   Pre-op nurse will coordinate an appropriate time for 1 ADULT support person, who may not rotate, to accompany patient in pre-op.  Children under the age of 51  must have an adult with them who is not the patient and must remain in the main waiting area with an adult.  If the patient needs to stay at the hospital during part of their recovery, the visitor guidelines for inpatient rooms apply.  Please refer to the [facility] website for the visitor guidelines for any additional information.   If you received a COVID test during your pre-op visit  it is requested that you wear a mask when out in public, stay away from anyone that may not be feeling well and notify your surgeon if you develop symptoms. If you have been in contact with anyone that has tested positive in the last 10 days please notify you surgeon.      Pre-operative CHG Bathing Instructions   You can play a key role in reducing the risk of infection after surgery. Your skin needs to be as free of germs as possible. You can reduce the number of germs on your skin by washing with CHG (chlorhexidine gluconate) soap before surgery. CHG is an antiseptic soap that kills germs and continues to kill germs even after washing.   DO NOT use if you have an allergy to chlorhexidine/CHG or antibacterial soaps. If your skin becomes reddened or irritated, stop using the CHG and notify one of our RNs at [phone].              TAKE A SHOWER THE NIGHT BEFORE SURGERY AND THE DAY  OF SURGERY    Please keep in mind the following:  DO NOT shave, including legs and underarms, 48 hours prior to surgery.   You may shave your face before/day of surgery.  Place clean sheets on your bed the night before surgery Use a clean washcloth (not used since being washed) for each shower. DO NOT sleep with pet's night before surgery.  CHG Shower Instructions:  Wash your face and private area with normal soap. If you choose to wash your hair, wash first with your normal shampoo.  After you use shampoo/soap, rinse your hair and body thoroughly to remove shampoo/soap residue.  Turn the water OFF and apply half the  bottle of CHG soap to a CLEAN washcloth.  Apply CHG soap ONLY FROM YOUR NECK DOWN TO YOUR TOES (washing for 3-5 minutes)  DO NOT use CHG soap on face, private areas, open wounds, or sores.  Pay special attention to the area where your surgery is being performed.  If you are having back surgery, having someone wash your back for you may be helpful. Wait 2 minutes after CHG soap is applied, then you may rinse off the CHG soap.  Pat dry with a clean towel  Put on clean pajamas    Additional instructions for the day of surgery: DO NOT APPLY any lotions, deodorants, cologne, or perfumes.   Do not wear jewelry or makeup Do not wear nail polish, gel polish, artificial nails, or any other type of covering on natural nails (fingers and toes) Do not bring valuables to the hospital. [facility] is not responsible for valuables/personal belongings. Put on clean/comfortable clothes.  Please brush your teeth.  Ask your nurse before applying any prescription medications to the skin.

## 2022-12-24 ENCOUNTER — Encounter (HOSPITAL_COMMUNITY): Payer: Self-pay

## 2022-12-24 ENCOUNTER — Encounter (HOSPITAL_BASED_OUTPATIENT_CLINIC_OR_DEPARTMENT_OTHER): Payer: Self-pay | Admitting: Plastic Surgery

## 2022-12-24 ENCOUNTER — Encounter (HOSPITAL_COMMUNITY)
Admission: RE | Admit: 2022-12-24 | Discharge: 2022-12-24 | Disposition: A | Discharge status: Home / Self Care | Payer: Medicaid Other | Source: Ambulatory Visit | Attending: Plastic Surgery | Admitting: Plastic Surgery

## 2022-12-24 ENCOUNTER — Other Ambulatory Visit: Payer: Self-pay

## 2022-12-24 VITALS — BP 102/67 | HR 59 | Temp 98.8°F | Resp 18 | Ht 61.0 in | Wt 140.6 lb

## 2022-12-24 DIAGNOSIS — Z01818 Encounter for other preprocedural examination: Secondary | ICD-10-CM | POA: Insufficient documentation

## 2022-12-24 DIAGNOSIS — I2510 Atherosclerotic heart disease of native coronary artery without angina pectoris: Secondary | ICD-10-CM | POA: Diagnosis not present

## 2022-12-24 DIAGNOSIS — G8929 Other chronic pain: Secondary | ICD-10-CM | POA: Diagnosis not present

## 2022-12-24 DIAGNOSIS — M542 Cervicalgia: Secondary | ICD-10-CM | POA: Insufficient documentation

## 2022-12-24 DIAGNOSIS — L304 Erythema intertrigo: Secondary | ICD-10-CM | POA: Insufficient documentation

## 2022-12-24 DIAGNOSIS — Z87891 Personal history of nicotine dependence: Secondary | ICD-10-CM | POA: Insufficient documentation

## 2022-12-24 DIAGNOSIS — N62 Hypertrophy of breast: Secondary | ICD-10-CM | POA: Diagnosis not present

## 2022-12-24 DIAGNOSIS — F32A Depression, unspecified: Secondary | ICD-10-CM | POA: Insufficient documentation

## 2022-12-24 DIAGNOSIS — Z01810 Encounter for preprocedural cardiovascular examination: Secondary | ICD-10-CM | POA: Diagnosis present

## 2022-12-24 DIAGNOSIS — Z01812 Encounter for preprocedural laboratory examination: Secondary | ICD-10-CM | POA: Diagnosis present

## 2022-12-24 LAB — CBC
HCT: 36.9 % (ref 36.0–46.0)
Hemoglobin: 11.8 g/dL — ABNORMAL LOW (ref 12.0–15.0)
MCH: 31 pg (ref 26.0–34.0)
MCHC: 32 g/dL (ref 30.0–36.0)
MCV: 96.9 fL (ref 80.0–100.0)
Platelets: 287 10*3/uL (ref 150–400)
RBC: 3.81 MIL/uL — ABNORMAL LOW (ref 3.87–5.11)
RDW: 12.4 % (ref 11.5–15.5)
WBC: 6.1 10*3/uL (ref 4.0–10.5)
nRBC: 0 % (ref 0.0–0.2)

## 2022-12-24 LAB — BASIC METABOLIC PANEL
Anion gap: 7 (ref 5–15)
BUN: 8 mg/dL (ref 6–20)
CO2: 26 mmol/L (ref 22–32)
Calcium: 9 mg/dL (ref 8.9–10.3)
Chloride: 105 mmol/L (ref 98–111)
Creatinine, Ser: 0.75 mg/dL (ref 0.44–1.00)
GFR, Estimated: >60 mL/min (ref >60)
Glucose, Bld: 96 mg/dL (ref 70–99)
Potassium: 3.6 mmol/L (ref 3.5–5.1)
Sodium: 138 mmol/L (ref 135–145)

## 2022-12-24 NOTE — Progress Notes (Signed)
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 PAT visit done at [facility], but noted surgery is now being moved to [facility].  History includes former smoker, depression (with intentional acetaminophen OD 03/2016), murmur, cholecystitis (s/p cholecystectomy 05/14/21), possible seizure (12/2014 of undetermined cause with normal brain MRI, MRV, awake and asleep EEG, and negative urine toxicology screen).   She reported being told by her PCP years ago that she had a mild murmur. She says cardiology referral or echocardiogram were not recommended. She denied chest pain, SOB, syncope, edema, or any activity restrictions. I did not appreciate a murmur on exam.HR RRR. Lungs were clear. No ankle edema. EKG showed NSR. She denied any known anesthesia complications. She says no cause for her seizure or seizure-like activity was identified in 2016, and that she is no longer on medication or followed by neurology. She denied recurrent seizures.   Anesthesia team to evaluate on the day of surgery.   VS: BP 102/67   Pulse (!) 59   Temp 37.1 C   Resp 18   Ht 5\' 1"  (1.549 m)   Wt 63.8 kg   LMP 12/20/2022 (Exact Date)   SpO2 99%   BMI 26.57 kg/m   PROVIDERS: Emogene Morgan, MD is PCP    LABS: Labs reviewed: Acceptable for surgery. (all labs ordered are listed, but only abnormal results are displayed)  Labs Reviewed  CBC - Abnormal; Notable for the following components:      Result Value   RBC 3.81 (*)    Hemoglobin 11.8 (*)    All other components within normal limits  BASIC METABOLIC PANEL    EKG: 12/24/22: NSR   CV: N/A  Past Medical History:   Diagnosis Date   Acetaminophen overdose, intentional self-harm, initial encounter (HCC) 03/08/2016   Bronchitis    chronic   Depression    Heart murmur    Does not require cardiologist    Past Surgical History:  Procedure Laterality Date   CHOLECYSTECTOMY  2022   TUBAL LIGATION N/A 01/03/2019   Procedure: POST PARTUM TUBAL LIGATION;  Surgeon: Levie Heritage, DO;  Location: [facility] LD ORS;  Service: Gynecology;  Laterality: N/A;    MEDICATIONS:  acetaminophen (TYLENOL) 500 MG tablet   F[place]UGREEK PO   No current facility-administered medications for this encounter.   Shonna Chock, PA-C Surgical Short Stay/Anesthesiology [facility] Phone [phone] [facility] Phone [phone] 12/24/2022 12:00 PM

## 2022-12-24 NOTE — Progress Notes (Signed)
PCP - Dr. Karie Fetch Cardiologist - Denies  PPM/ICD - Denies Device Orders - n/a Rep Notified - n/a  Chest x-ray - n/a EKG - 12/24/2022 Stress Test - Denies ECHO - Denies Cardiac Cath - Denies  Sleep Study - Denies CPAP - n/a  No DM  Last dose of GLP1 agonist- n/a  GLP1 instructions: n/a  Blood Thinner Instructions: n/a Aspirin Instructions: n/a  ERAS Protcol - Clear liquids until 0430 morning of surgery PRE-SURGERY Ensure or G2- n/a  COVID TEST- n/a   Anesthesia review: Yes. Heart murmur assessed by Shonna Chock, PA-C   Patient denies shortness of breath, fever, cough and chest pain at PAT appointment. Pt denies any respiratory illness/infection in the last two months.  All instructions explained to the patient, with a verbal understanding of the material. Patient agrees to go over the instructions while at home for a better understanding. Patient also instructed to self quarantine after being tested for COVID-19. The opportunity to ask questions was provided.

## 2022-12-24 NOTE — H&P (Signed)
  Subjective Patient ID: Veronica Brooks is a 27 y.o. female.     HPI   Returns for follow up discussion prior to planned breast reduction. She has had prior consultations with Dr. Odis Luster. Current- not sure cup size, wears two bras. Reports over year history neck back and shoulder pain with associated rashes. This has been unrelieved with use specialty fitted bras, regular exercise/activity, hot/cold packs, OTC pain medication for over 3 month trial without relief. With regards to rashes, has tried powders and hygiene measures for over 3 month trial without relief. Endorses numbness hands.    Wt stable.   Prior MMG in remote past obtained as part of breast reduction work up. Denies FH breast or ovarian ca.    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 Lives with mother and kids ages 65, 18, 36, 60. Done having kids.   Review of Systems  Respiratory:  Positive for shortness of breath.   Musculoskeletal:  Positive for back pain and neck pain.  Skin:  Positive for rash.  All other systems reviewed and are negative.         Objective Physical Exam  Cardiovascular: Normal rate, regular rhythm and normal heart sounds.    Pulmonary/Chest Effort normal and breath sounds normal.    Skin   Fitzpatrick 5    Lymph: no palpable axillary adenopathy   +shoulder grooving Breasts: grade 3 ptosis bilateral no masses Left> right volume SN to nipple R 30 L 33 cm BW R 20 L 20 cm Nipple to IMF R 10 l 10 cm       Assessment/Plan Macromastia Chronic neck and back pain    At her age,  do not require MMG prior to surgery.   Chronic neck and back pain, intertrigo in setting of macromastia that has failed conservative management over 3 month trial. The pain is not related to other diagnoses.There is a reasonable likelihood that the patient's symptoms are primarily due to macromastia. Breast reduction is likely to result in improvement of the chronic pain.   Reviewed reduction with  anchor type scars, OP surgery, drains, post operative visits and limitations, recovery. Diminished sensation nipple and breast skin, risk of nipple loss, wound healing problems, asymmetry, incidental carcinoma, changes with wt gain/loss, aging, unacceptable cosmetic appearance reviewed. Reviewed changes with pregnancy, effect on ability to breast feed. Reviewed scar maturation over months, risks hyper or hypopigmented scar. Reviewed cannot assure cup size. Counseled if she develops NAC compromise/necrosis, she would not be able to breast feed from this breast in future.    Additional risks including but not limited to bleeding, hematoma, seroma, need for additional procedures, infection, wound healing problems, unacceptable cosmetic result, asymmetry, blood clots in legs or lungs reviewed. Completed ASPS consent for breast reduction.    Drain teaching completed. Rx for tramadol given.   Anticipate over 345 g resection from each breast    Glenna Fellows, MD [facility]astic [facility]  Office/ physician access line after hours [phone]

## 2022-12-24 NOTE — Anesthesia Preprocedure Evaluation (Addendum)
Anesthesia Evaluation  Patient identified by MRN, date of birth, ID band Patient awake    Reviewed: Allergy & Precautions, NPO status , Patient's Chart, lab work & pertinent test results  Airway Mallampati: I  TM Distance: >3 FB Neck ROM: Full    Dental no notable dental hx. (+) Teeth Intact, Dental Advisory Given   Pulmonary neg pulmonary ROS, Patient abstained from smoking., former smoker   Pulmonary exam normal breath sounds clear to auscultation       Cardiovascular negative cardio ROS Normal cardiovascular exam Rhythm:Regular Rate:Normal     Neuro/Psych Seizures -, Well Controlled,  PSYCHIATRIC DISORDERS  Depression       GI/Hepatic negative GI ROS, Neg liver ROS,,,  Endo/Other  negative endocrine ROS    Renal/GU negative Renal ROS  negative genitourinary   Musculoskeletal negative musculoskeletal ROS (+)    Abdominal   Peds  Hematology negative hematology ROS (+)   Anesthesia Other Findings History includes former smoker, depression (with intentional acetaminophen OD 03/2016), murmur, cholecystitis (s/p cholecystectomy 05/14/21), possible seizure (12/2014 of undetermined cause with normal brain MRI, MRV, awake and asleep EEG, and negative urine toxicology screen).   Reproductive/Obstetrics                             Anesthesia Physical Anesthesia Plan  ASA: 2  Anesthesia Plan: General   Post-op Pain Management: Tylenol PO (pre-op)* and Dilaudid IV   Induction: Intravenous  PONV Risk Score and Plan: 3 and Midazolam, Dexamethasone and Ondansetron  Airway Management Planned: Oral ETT  Additional Equipment:   Intra-op Plan:   Post-operative Plan: Extubation in OR  Informed Consent: I have reviewed the patients History and Physical, chart, labs and discussed the procedure including the risks, benefits and alternatives for the proposed anesthesia with the patient or authorized  representative who has indicated his/her understanding and acceptance.     Dental advisory given  Plan Discussed with: CRNA  Anesthesia Plan Comments: (PAT note written 12/24/2022 by Shonna Chock, PA-C.  )       Anesthesia Quick Evaluation

## 2023-01-01 ENCOUNTER — Other Ambulatory Visit: Payer: Self-pay

## 2023-01-01 ENCOUNTER — Encounter (HOSPITAL_BASED_OUTPATIENT_CLINIC_OR_DEPARTMENT_OTHER): Payer: Self-pay | Admitting: Plastic Surgery

## 2023-01-01 ENCOUNTER — Encounter (HOSPITAL_BASED_OUTPATIENT_CLINIC_OR_DEPARTMENT_OTHER)
Admission: RE | Disposition: A | Discharge status: Home / Self Care | Payer: Self-pay | Source: Ambulatory Visit | Attending: Plastic Surgery

## 2023-01-01 ENCOUNTER — Ambulatory Visit (HOSPITAL_COMMUNITY)
Admission: RE | Admit: 2023-01-01 | Discharge: 2023-01-01 | Disposition: A | Discharge status: Home / Self Care | Payer: Medicaid Other | Source: Ambulatory Visit | Attending: Plastic Surgery | Admitting: Plastic Surgery

## 2023-01-01 ENCOUNTER — Ambulatory Visit (HOSPITAL_BASED_OUTPATIENT_CLINIC_OR_DEPARTMENT_OTHER): Payer: Medicaid Other | Admitting: Anesthesiology

## 2023-01-01 ENCOUNTER — Ambulatory Visit (HOSPITAL_BASED_OUTPATIENT_CLINIC_OR_DEPARTMENT_OTHER): Payer: Medicaid Other | Admitting: Vascular Surgery

## 2023-01-01 DIAGNOSIS — G8929 Other chronic pain: Secondary | ICD-10-CM | POA: Insufficient documentation

## 2023-01-01 DIAGNOSIS — N62 Hypertrophy of breast: Secondary | ICD-10-CM

## 2023-01-01 DIAGNOSIS — M549 Dorsalgia, unspecified: Secondary | ICD-10-CM | POA: Insufficient documentation

## 2023-01-01 DIAGNOSIS — Z01818 Encounter for other preprocedural examination: Secondary | ICD-10-CM

## 2023-01-01 DIAGNOSIS — Z87891 Personal history of nicotine dependence: Secondary | ICD-10-CM | POA: Diagnosis not present

## 2023-01-01 DIAGNOSIS — M542 Cervicalgia: Secondary | ICD-10-CM | POA: Insufficient documentation

## 2023-01-01 HISTORY — PX: BREAST REDUCTION SURGERY: SHX8

## 2023-01-01 LAB — POCT PREGNANCY, URINE: Preg Test, Ur: NEGATIVE

## 2023-01-01 SURGERY — MAMMOPLASTY, REDUCTION
Anesthesia: General | Site: Breast | Laterality: Bilateral

## 2023-01-01 MED ORDER — CELECOXIB 200 MG PO CAPS
ORAL_CAPSULE | ORAL | Status: AC
  Filled 2023-01-01: qty 1

## 2023-01-01 MED ORDER — CHLORHEXIDINE GLUCONATE CLOTH 2 % EX PADS: 6.0000 | MEDICATED_PAD | Freq: Once | CUTANEOUS | Status: DC

## 2023-01-01 MED ORDER — LACTATED RINGERS IV SOLN: INTRAVENOUS | Status: DC

## 2023-01-01 MED ORDER — HYDROMORPHONE HCL 1 MG/ML IJ SOLN
INTRAMUSCULAR | Status: DC | PRN
  Administered 2023-01-01: .25 mg via INTRAVENOUS
  Administered 2023-01-01: .5 mg via INTRAVENOUS
  Administered 2023-01-01: .25 mg via INTRAVENOUS

## 2023-01-01 MED ORDER — CELECOXIB 200 MG PO CAPS
200.0000 mg | ORAL_CAPSULE | ORAL | Status: AC
  Administered 2023-01-01: 200 mg via ORAL

## 2023-01-01 MED ORDER — FENTANYL CITRATE (PF) 100 MCG/2ML IJ SOLN
INTRAMUSCULAR | Status: DC | PRN
  Administered 2023-01-01 (×2): 50 ug via INTRAVENOUS

## 2023-01-01 MED ORDER — FENTANYL CITRATE (PF) 100 MCG/2ML IJ SOLN
INTRAMUSCULAR | Status: AC
  Filled 2023-01-01: qty 2

## 2023-01-01 MED ORDER — ACETAMINOPHEN 500 MG PO TABS
1000.0000 mg | ORAL_TABLET | Freq: Once | ORAL | Status: DC
Start: 2023-01-01 — End: 2023-01-01

## 2023-01-01 MED ORDER — LIDOCAINE 2% (20 MG/ML) 5 ML SYRINGE
INTRAMUSCULAR | Status: AC
  Filled 2023-01-01: qty 5

## 2023-01-01 MED ORDER — DEXAMETHASONE SODIUM PHOSPHATE 10 MG/ML IJ SOLN
INTRAMUSCULAR | Status: AC
Start: 2023-01-01 — End: ?
  Filled 2023-01-01: qty 1

## 2023-01-01 MED ORDER — EPHEDRINE SULFATE (PRESSORS) 50 MG/ML IJ SOLN
INTRAMUSCULAR | Status: DC | PRN
  Administered 2023-01-01: 5 mg via INTRAVENOUS

## 2023-01-01 MED ORDER — BUPIVACAINE HCL (PF) 0.5 % IJ SOLN
INTRAMUSCULAR | Status: DC | PRN
  Administered 2023-01-01: 30 mL

## 2023-01-01 MED ORDER — MIDAZOLAM HCL 2 MG/2ML IJ SOLN
INTRAMUSCULAR | Status: AC
Start: 2023-01-01 — End: ?
  Filled 2023-01-01: qty 2

## 2023-01-01 MED ORDER — ORAL CARE MOUTH RINSE: 15.0000 mL | Freq: Once | OROMUCOSAL | Status: DC

## 2023-01-01 MED ORDER — CHLORHEXIDINE GLUCONATE 0.12 % MT SOLN: 15.0000 mL | Freq: Once | OROMUCOSAL | Status: DC

## 2023-01-01 MED ORDER — 0.9 % SODIUM CHLORIDE (POUR BTL) OPTIME
TOPICAL | Status: DC | PRN
  Administered 2023-01-01: 200 mL

## 2023-01-01 MED ORDER — PHENYLEPHRINE HCL (PRESSORS) 10 MG/ML IV SOLN
INTRAVENOUS | Status: DC | PRN
  Administered 2023-01-01 (×2): 80 ug via INTRAVENOUS

## 2023-01-01 MED ORDER — ACETAMINOPHEN 500 MG PO TABS
1000.0000 mg | ORAL_TABLET | ORAL | Status: AC
  Administered 2023-01-01: 1000 mg via ORAL

## 2023-01-01 MED ORDER — PROPOFOL 10 MG/ML IV BOLUS
INTRAVENOUS | Status: DC | PRN
  Administered 2023-01-01: 120 mg via INTRAVENOUS

## 2023-01-01 MED ORDER — HYDROMORPHONE HCL 1 MG/ML IJ SOLN
INTRAMUSCULAR | Status: AC
  Filled 2023-01-01: qty 1

## 2023-01-01 MED ORDER — CEFAZOLIN SODIUM-DEXTROSE 2-4 GM/100ML-% IV SOLN
2.0000 g | INTRAVENOUS | Status: AC
  Administered 2023-01-01: 2 g via INTRAVENOUS

## 2023-01-01 MED ORDER — LIDOCAINE HCL (CARDIAC) PF 100 MG/5ML IV SOSY
PREFILLED_SYRINGE | INTRAVENOUS | Status: DC | PRN
  Administered 2023-01-01: 60 mg via INTRAVENOUS

## 2023-01-01 MED ORDER — ROCURONIUM BROMIDE 10 MG/ML (PF) SYRINGE
PREFILLED_SYRINGE | INTRAVENOUS | Status: AC
  Filled 2023-01-01: qty 10

## 2023-01-01 MED ORDER — PROPOFOL 500 MG/50ML IV EMUL
INTRAVENOUS | Status: AC
  Filled 2023-01-01: qty 50

## 2023-01-01 MED ORDER — ONDANSETRON HCL 4 MG/2ML IJ SOLN
INTRAMUSCULAR | Status: AC
  Filled 2023-01-01: qty 2

## 2023-01-01 MED ORDER — CEFAZOLIN SODIUM-DEXTROSE 2-4 GM/100ML-% IV SOLN
INTRAVENOUS | Status: AC
  Filled 2023-01-01: qty 100

## 2023-01-01 MED ORDER — ONDANSETRON HCL 4 MG/2ML IJ SOLN
INTRAMUSCULAR | Status: DC | PRN
  Administered 2023-01-01: 4 mg via INTRAVENOUS

## 2023-01-01 MED ORDER — SUGAMMADEX SODIUM 200 MG/2ML IV SOLN
INTRAVENOUS | Status: DC | PRN
  Administered 2023-01-01: 200 mg via INTRAVENOUS

## 2023-01-01 MED ORDER — FENTANYL CITRATE (PF) 100 MCG/2ML IJ SOLN
25.0000 ug | INTRAMUSCULAR | Status: DC | PRN
  Administered 2023-01-01 (×2): 50 ug via INTRAVENOUS

## 2023-01-01 MED ORDER — DEXAMETHASONE SODIUM PHOSPHATE 4 MG/ML IJ SOLN
INTRAMUSCULAR | Status: DC | PRN
  Administered 2023-01-01: 10 mg via INTRAVENOUS

## 2023-01-01 MED ORDER — ACETAMINOPHEN 500 MG PO TABS
ORAL_TABLET | ORAL | Status: AC
  Filled 2023-01-01: qty 2

## 2023-01-01 MED ORDER — MIDAZOLAM HCL 5 MG/5ML IJ SOLN
INTRAMUSCULAR | Status: DC | PRN
  Administered 2023-01-01: 2 mg via INTRAVENOUS

## 2023-01-01 MED ORDER — ROCURONIUM BROMIDE 100 MG/10ML IV SOLN
INTRAVENOUS | Status: DC | PRN
  Administered 2023-01-01: 50 mg via INTRAVENOUS

## 2023-01-01 MED ORDER — BUPIVACAINE HCL (PF) 0.5 % IJ SOLN
INTRAMUSCULAR | Status: AC
  Filled 2023-01-01: qty 30

## 2023-01-01 MED ORDER — DEXMEDETOMIDINE HCL IN NACL 80 MCG/20ML IV SOLN
INTRAVENOUS | Status: DC | PRN
  Administered 2023-01-01: 4 ug via INTRAVENOUS
  Administered 2023-01-01 (×2): 8 ug via INTRAVENOUS

## 2023-01-01 MED ORDER — EPHEDRINE 5 MG/ML INJ
INTRAVENOUS | Status: AC
  Filled 2023-01-01: qty 5

## 2023-01-01 SURGICAL SUPPLY — 52 items
ADH SKN CLS APL DERMABOND .7 (GAUZE/BANDAGES/DRESSINGS) ×2
APL PRP STRL LF DISP 70% ISPRP (MISCELLANEOUS) ×2
BINDER BREAST 3XL (GAUZE/BANDAGES/DRESSINGS) IMPLANT
BINDER BREAST LRG (GAUZE/BANDAGES/DRESSINGS) IMPLANT
BINDER BREAST MEDIUM (GAUZE/BANDAGES/DRESSINGS) IMPLANT
BINDER BREAST XLRG (GAUZE/BANDAGES/DRESSINGS) IMPLANT
BINDER BREAST XXLRG (GAUZE/BANDAGES/DRESSINGS) IMPLANT
BLADE SURG 10 STRL SS (BLADE) ×4 IMPLANT
BNDG GAUZE DERMACEA FLUFF 4 (GAUZE/BANDAGES/DRESSINGS) ×2 IMPLANT
BNDG GZE DERMACEA 4 6PLY (GAUZE/BANDAGES/DRESSINGS) ×2
CANISTER SUCT 1200ML W/VALVE (MISCELLANEOUS) ×1 IMPLANT
CHLORAPREP W/TINT 26 (MISCELLANEOUS) ×2 IMPLANT
COVER BACK TABLE 60X90IN (DRAPES) ×1 IMPLANT
COVER MAYO STAND STRL (DRAPES) ×1 IMPLANT
DERMABOND ADVANCED .7 DNX12 (GAUZE/BANDAGES/DRESSINGS) ×2 IMPLANT
DRAIN CHANNEL 15F RND FF W/TCR (WOUND CARE) IMPLANT
DRAIN CHANNEL 19F RND (DRAIN) IMPLANT
DRAPE TOP ARMCOVERS (MISCELLANEOUS) ×1 IMPLANT
DRAPE U-SHAPE 76X120 STRL (DRAPES) ×1 IMPLANT
DRAPE UTILITY XL STRL (DRAPES) ×1 IMPLANT
ELECT COATED BLADE 2.86 ST (ELECTRODE) ×1 IMPLANT
ELECT REM PT RETURN 9FT ADLT (ELECTROSURGICAL) ×1
ELECTRODE REM PT RTRN 9FT ADLT (ELECTROSURGICAL) ×1 IMPLANT
EVACUATOR SILICONE 100CC (DRAIN) IMPLANT
GAUZE PAD ABD 8X10 STRL (GAUZE/BANDAGES/DRESSINGS) ×2 IMPLANT
GLOVE BIO SURGEON STRL SZ 6 (GLOVE) ×2 IMPLANT
GLOVE BIOGEL PI IND STRL 7.0 (GLOVE) IMPLANT
GLOVE ECLIPSE 6.5 STRL STRAW (GLOVE) IMPLANT
GOWN STRL REUS W/ TWL LRG LVL3 (GOWN DISPOSABLE) ×2 IMPLANT
GOWN STRL REUS W/TWL LRG LVL3 (GOWN DISPOSABLE) ×2
MARKER SKIN DUAL TIP RULER LAB (MISCELLANEOUS) IMPLANT
NDL HYPO 25X1 1.5 SAFETY (NEEDLE) ×1 IMPLANT
NEEDLE HYPO 25X1 1.5 SAFETY (NEEDLE) ×1
NS IRRIG 1000ML POUR BTL (IV SOLUTION) ×1 IMPLANT
PACK BASIN DAY SURGERY FS (CUSTOM PROCEDURE TRAY) ×1 IMPLANT
PENCIL SMOKE EVACUATOR (MISCELLANEOUS) ×1 IMPLANT
PIN SAFETY STERILE (MISCELLANEOUS) ×1 IMPLANT
SHEET MEDIUM DRAPE 40X70 STRL (DRAPES) ×1 IMPLANT
SLEEVE SCD COMPRESS KNEE MED (STOCKING) ×1 IMPLANT
SPONGE T-LAP 18X18 ~~LOC~~+RFID (SPONGE) ×3 IMPLANT
STAPLER SKIN PROX WIDE 3.9 (STAPLE) ×1 IMPLANT
SUT ETHILON 2 0 FS 18 (SUTURE) IMPLANT
SUT MNCRL AB 4-0 PS2 18 (SUTURE) IMPLANT
SUT VIC AB 3-0 PS1 18 (SUTURE) ×8
SUT VIC AB 3-0 PS1 18XBRD (SUTURE) IMPLANT
SUT VIC AB 4-0 PS2 18 (SUTURE) IMPLANT
SYR BULB IRRIG 60ML STRL (SYRINGE) ×1 IMPLANT
SYR CONTROL 10ML LL (SYRINGE) ×1 IMPLANT
TOWEL GREEN STERILE FF (TOWEL DISPOSABLE) ×2 IMPLANT
TUBE CONNECTING 20X1/4 (TUBING) ×1 IMPLANT
UNDERPAD 30X36 HEAVY ABSORB (UNDERPADS AND DIAPERS) ×2 IMPLANT
YANKAUER SUCT BULB TIP NO VENT (SUCTIONS) ×1 IMPLANT

## 2023-01-01 NOTE — Interval H&P Note (Signed)
History and Physical Interval Note:  01/01/2023 6:56 AM  Veronica Brooks  has presented today for surgery, with the diagnosis of macromastia chronic neck and back pain intertrigo.  The various methods of treatment have been discussed with the patient and family. After consideration of risks, benefits and other options for treatment, the patient has consented to  Procedure(s): MAMMARY REDUCTION  (BREAST) (Bilateral) as a surgical intervention.  The patient's history has been reviewed, patient examined, no change in status, stable for surgery.  I have reviewed the patient's chart and labs.  Questions were answered to the patient's satisfaction.     Irean Hong [name]

## 2023-01-01 NOTE — Anesthesia Procedure Notes (Signed)
Procedure Name: Intubation Date/Time: 01/01/2023 7:35 AM  Performed by: Burna Cash, CRNAPre-anesthesia Checklist: Patient identified, Emergency Drugs available, Suction available and Patient being monitored Patient Re-evaluated:Patient Re-evaluated prior to induction Oxygen Delivery Method: Circle system utilized Preoxygenation: Pre-oxygenation with 100% oxygen Induction Type: IV induction Ventilation: Mask ventilation without difficulty Laryngoscope Size: Mac and 3 Grade View: Grade I Tube type: Oral Tube size: 7.0 mm Number of attempts: 1 Airway Equipment and Method: Stylet and Oral airway Placement Confirmation: ETT inserted through vocal cords under direct vision, positive ETCO2 and breath sounds checked- equal and bilateral Secured at: 21 cm Tube secured with: Tape Dental Injury: Teeth and Oropharynx as per pre-operative assessment

## 2023-01-01 NOTE — Op Note (Signed)
Operative Note   DATE OF OPERATION: 11.1.24  LOCATION: [facility]-outpatient  SURGICAL DIVISION: Plastic Surgery  PREOPERATIVE DIAGNOSES:  1. Macromastia 2. Chronic neck and back pain  POSTOPERATIVE DIAGNOSES:  same  PROCEDURE:  Bilateral breast reduction  SURGEON: Glenna Fellows MD MBA  ASSISTANT: none  ANESTHESIA:  General.   EBL: 50 ml  COMPLICATIONS: None immediate.   INDICATIONS FOR PROCEDURE:  The patient, Veronica Brooks, is a 27 y.o. female born on 1995-10-26, is here for treatment chronic neck and back pain in setting of macromastia that has failed conservative measures.   FINDINGS: Right reduction 371 g Left reduction 616 g  DESCRIPTION OF PROCEDURE:  The patient was marked standing in the preoperative area to mark sternal notch, chest midline, anterior axillary lines, inframammary folds. The location of new nipple areolar complex was marked at level of on inframammary fold on anterior surface breast by palpation. This was marked symmetric over bilateral breasts. With aid of Wise pattern marker, location of new nipple areolar complex and vertical limbs (6.5 cm) were marked by displacement of breasts along meridian. The patient was taken to the operating room. SCDs were placed and IV antibiotics were given. The patient's operative site was prepped and draped in a sterile fashion. A time out was performed and all information was confirmed to be correct.     I began on left breast. Over left breast, superior medial pedicle marked and nipple areolar complex incised with 38 mm diameter marker at border areola margin. Pedicle deepithlialized and developed to chest wall. Breast tissue resected over lower pole. Medial and lateral flaps developed. Additional superior and lateral breast tissue excised. Breast tailor tacked closed.    I then directed attention to right breast where superior medial pedicle designed. NAC incised with 38 mm diameter marker at border areola margin.  The pedicle was deepithelialized. Pedicle developed to chest wall. Breast tissue resected over lower pole. Medial and lateral flaps developed. Additional superior and lateral breast tissue excised. Breast tailor tacked closed. Patient brought to upright sitting position and assessed for symmetry. Patient returned to supine position. Breast cavities irrigated and hemostasis obtained. Local anesthetic infiltrated throughout each breast. 15 Fr JP placed in each breast and secured with 2-0 nylon. Closure completed bilateral with 3-0 vicryl to approximate dermis along inframammary fold and vertical limb. NAC inset with 3-0 vicryl in dermis. Skin closure completed with 4-0 monocryl subcuticular throughout. Tissue adhesive applied. Dry dressing and breast binder applied.  The patient was allowed to wake from anesthesia, extubated and taken to the recovery room in satisfactory condition.   SPECIMENS: right and left breast reduction  DRAINS: 15 Fr JP in right and left breast  Glenna Fellows, MD [facility] Plastic & Reconstructive Surgery  Office/ physician access line after hours [phone]

## 2023-01-01 NOTE — Anesthesia Postprocedure Evaluation (Signed)
Anesthesia Post Note  Patient: Furniture conservator/restorer  Procedure(s) Performed: MAMMARY REDUCTION  (BREAST) (Bilateral: Breast)     Patient location during evaluation: PACU Anesthesia Type: General Level of consciousness: awake and alert Pain management: pain level controlled Vital Signs Assessment: post-procedure vital signs reviewed and stable Respiratory status: spontaneous breathing, nonlabored ventilation, respiratory function stable and patient connected to nasal cannula oxygen Cardiovascular status: blood pressure returned to baseline and stable Postop Assessment: no apparent nausea or vomiting Anesthetic complications: no  No notable events documented.  Last Vitals:  Vitals:   01/01/23 1115 01/01/23 1130  BP: (!) 107/55 (!) 104/55  Pulse: 96 93  Resp: 13 (!) 23  Temp:    SpO2: 95% 97%    Last Pain:  Vitals:   01/01/23 0707  TempSrc: Temporal  PainSc: 0-No pain                 Pleas Carneal L Aideliz Garmany

## 2023-01-01 NOTE — Discharge Instructions (Signed)
   No tylenol until 1:15 p.m. No ibuprofen until 1:15 p.m.  JP Drain Totals Bring this sheet to all of your post-operative appointments while you have your drains. Please measure your drains by CC's or ML's. Make sure you drain and measure your JP Drains 2 or 3 times per day. At the end of each day, add up totals for the left side and add up totals for the right side.    ( 9 am )     ( 3 pm )        ( 9 pm )                Date L  R  L  R  L  R  Total L/R                                                                                                                                                                                       Post Anesthesia Home Care Instructions  Activity: Get plenty of rest for the remainder of the day. A responsible individual must stay with you for 24 hours following the procedure.  For the next 24 hours, DO NOT: -Drive a car -Advertising copywriter -Drink alcoholic beverages -Take any medication unless instructed by your physician -Make any legal decisions or sign important papers.  Meals: Start with liquid foods such as gelatin or soup. Progress to regular foods as tolerated. Avoid greasy, spicy, heavy foods. If nausea and/or vomiting occur, drink only clear liquids until the nausea and/or vomiting subsides. Call your physician if vomiting continues.  Special Instructions/Symptoms: Your throat may feel dry or sore from the anesthesia or the breathing tube placed in your throat during surgery. If this causes discomfort, gargle with warm salt water. The discomfort should disappear within 24 hours.  If you had a scopolamine patch placed behind your ear for the management of post- operative nausea and/or vomiting:  1. The medication in the patch is effective for 72 hours, after which it should be removed.  Wrap patch in a tissue and discard in the trash. Wash hands thoroughly with soap and water. 2. You may remove the patch earlier than 72 hours if  you experience unpleasant side effects which may include dry mouth, dizziness or visual disturbances. 3. Avoid touching the patch. Wash your hands with soap and water after contact with the patch.

## 2023-01-01 NOTE — Transfer of Care (Signed)
Immediate Anesthesia Transfer of Care Note  Patient: Veronica Brooks  Procedure(s) Performed: MAMMARY REDUCTION  (BREAST) (Bilateral: Breast)  Patient Location: PACU  Anesthesia Type:General  Level of Consciousness: sedated  Airway & Oxygen Therapy: Patient Spontanous Breathing and Patient connected to face mask oxygen  Post-op Assessment: Report given to RN and Post -op Vital signs reviewed and stable  Post vital signs: Reviewed and stable  Last Vitals:  Vitals Value Taken Time  BP 104/48 01/01/23 1045  Temp    Pulse 88 01/01/23 1047  Resp 17 01/01/23 1047  SpO2 98 % 01/01/23 1047  Vitals shown include unfiled device data.  Last Pain:  Vitals:   01/01/23 0707  TempSrc: Temporal  PainSc: 0-No pain         Complications: No notable events documented.

## 2023-01-04 ENCOUNTER — Encounter (HOSPITAL_BASED_OUTPATIENT_CLINIC_OR_DEPARTMENT_OTHER): Payer: Self-pay | Admitting: Plastic Surgery

## 2023-01-04 LAB — SURGICAL PATHOLOGY

## 2023-07-01 ENCOUNTER — Ambulatory Visit (HOSPITAL_COMMUNITY)
Admission: EM | Admit: 2023-07-01 | Discharge: 2023-07-01 | Disposition: A | Discharge status: Home / Self Care | Attending: Nurse Practitioner | Admitting: Nurse Practitioner

## 2023-07-01 DIAGNOSIS — F331 Major depressive disorder, recurrent, moderate: Secondary | ICD-10-CM | POA: Insufficient documentation

## 2023-07-01 MED ORDER — HYDROXYZINE HCL 25 MG PO TABS: 25.0000 mg | ORAL_TABLET | Freq: Three times a day (TID) | ORAL | 0 refills | Status: AC | PRN
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
## 2023-07-01 NOTE — Discharge Instructions (Addendum)
 A prescription for Hydroxyzine  has been sent to your pharmacy on file. Please take one tablet as needed for anxiety or sleep, and follow up with the outpatient psychiatrist that we have you scheduled to see on June 10th at 2pm for refills and for management of depressive symptoms.  Employee Assistance Counseling Program Request a Consultation Call [phone] to meet with a [place] representative and develop an EACP plan tailored to your organization.  Existing Partners Call [phone] for virtual appointments and crisis support.  Download Our Information Card  Give your organization's employees the tools they need to succeed. Through the [facility] employee assistance counseling program (EACP), your employees learn how to manage conflicts and personal issues--so they can focus on their jobs.   Other reasons why your employees should use EACP:   Stress Marital Conflict Depression Substance Abuse Loss and Grief Family Problems Job Burnout Anxiety Aging Parents Parenting or School Problems Financial Stress Loss of Life Direction Workplace Difficulties  Let our licensed, experienced therapists help your employees create a positive and safe work environment.   EACP Brochures Stress  Download and print one of our brochures to share with someone that may need assistance in these areas:  Stress Stress and Anxiety Relationships Depression Personal & Financial Guidance When your employees successfully take care of personal matters, you may notice reduced absenteeism and improved morale. That's why [facility] EACP gives your employees access to:  Confidential individual, couples and family counseling that benefits relationships and behavioral health conditions 24/7 access to a supportive staff therapist through our helpline, [phone] Free financial counseling sessions  Your organization receives statistics on employee usage of EACP services, so you see the return on your  investment.   Your Partner in Work International aid/development worker with the [employer] EACP team to give your organization ongoing, cost-effective support, including:  Workshops and lunch and learns on a broad range of topics, such as teambuilding, stress management and preventing sexual harassment Management consultations that sharpen leadership skills and offer strategies for helping employees work more effectively Crisis intervention and debriefing to help your employees process a distressing event and determine next steps  You'll appreciate quarterly opportunities to meet with your peers from other [employer] partner companies, listen to guest speakers and discuss ways to resolve common workplace issues.    Locations [facility] [address] [place], [place] [address] Jonette Nestle  (Across from [facility] [address] [place], [place] [address] [facility] [address] [place], [place] [address] Wellness Matters  Key Ingredients for Workplace Sustainability     Three Benefits of [employer] [place] in [employer] Workplaces     Living Well: Diabetes Prevention and Quarry manager More  [facility]  [address] [place], [place] [address] [phone] Facebook X Youtube Instagram LinkedIn Contact Us  About Us  Production designer, theatre/television/film Patient Rights Notice of Publishing copy Programs Your Rights and Protections Against [place] Medical Bills Price Transparency Medicaid Eligibility Select Language?  2025 [employer]. All rights reserved.

## 2023-08-10 ENCOUNTER — Ambulatory Visit: Admitting: Psychiatry
